# Patient Record
Sex: Female | Born: 1950
Health system: Southern US, Community
[De-identification: ages and names within clinical notes are randomized; demographics above are authoritative.]

## PROBLEM LIST (undated history)

## (undated) DIAGNOSIS — E669 Obesity, unspecified: Secondary | ICD-10-CM

## (undated) DIAGNOSIS — M129 Arthropathy, unspecified: Secondary | ICD-10-CM

## (undated) DIAGNOSIS — K5190 Ulcerative colitis, unspecified, without complications: Secondary | ICD-10-CM

## (undated) DIAGNOSIS — R7303 Prediabetes: Secondary | ICD-10-CM

## (undated) DIAGNOSIS — E785 Hyperlipidemia, unspecified: Secondary | ICD-10-CM

## (undated) DIAGNOSIS — G5601 Carpal tunnel syndrome, right upper limb: Secondary | ICD-10-CM

## (undated) DIAGNOSIS — M1710 Unilateral primary osteoarthritis, unspecified knee: Secondary | ICD-10-CM

## (undated) DIAGNOSIS — R079 Chest pain, unspecified: Secondary | ICD-10-CM

## (undated) DIAGNOSIS — Z8249 Family history of ischemic heart disease and other diseases of the circulatory system: Secondary | ICD-10-CM

## (undated) DIAGNOSIS — K219 Gastro-esophageal reflux disease without esophagitis: Secondary | ICD-10-CM

## (undated) DIAGNOSIS — J309 Allergic rhinitis, unspecified: Secondary | ICD-10-CM

## (undated) HISTORY — PX: FOOT SURGERY: SHX648

## (undated) HISTORY — DX: Hyperlipidemia, unspecified: E78.5

## (undated) HISTORY — DX: Obesity, unspecified: E66.9

## (undated) HISTORY — DX: Chest pain, unspecified: R07.9

## (undated) HISTORY — PX: OTHER SURGICAL HISTORY: SHX169

## (undated) HISTORY — DX: Carpal tunnel syndrome, right upper limb: G56.01

## (undated) HISTORY — DX: Arthropathy, unspecified: M12.9

## (undated) HISTORY — PX: HAMMER TOE SURGERY: SHX385

## (undated) HISTORY — DX: Gastro-esophageal reflux disease without esophagitis: K21.9

## (undated) HISTORY — DX: Allergic rhinitis, unspecified: J30.9

## (undated) HISTORY — DX: Ulcerative colitis, unspecified, without complications: K51.90

## (undated) HISTORY — DX: Unilateral primary osteoarthritis, unspecified knee: M17.10

## (undated) HISTORY — DX: Prediabetes: R73.03

## (undated) HISTORY — DX: Family history of ischemic heart disease and other diseases of the circulatory system: Z82.49

---

## 1986-03-14 HISTORY — PX: TUBAL LIGATION: SHX77

## 1986-03-14 HISTORY — PX: BREAST SURGERY: SHX581

## 1999-03-15 HISTORY — PX: ABDOMINAL HYSTERECTOMY: SHX81

## 2003-03-15 HISTORY — PX: LAPAROSCOPIC GASTRIC BANDING: SHX1100

## 2004-03-14 LAB — HM COLONOSCOPY: HM Colonoscopy: NORMAL

## 2008-03-14 LAB — CONVERTED CEMR LAB: Pap Smear: NEGATIVE

## 2008-08-01 ENCOUNTER — Encounter: Payer: Self-pay | Admitting: Internal Medicine

## 2008-08-01 ENCOUNTER — Encounter: Payer: Self-pay | Admitting: Cardiology

## 2008-11-20 ENCOUNTER — Ambulatory Visit: Payer: Self-pay | Admitting: Internal Medicine

## 2008-11-20 DIAGNOSIS — K5190 Ulcerative colitis, unspecified, without complications: Secondary | ICD-10-CM | POA: Insufficient documentation

## 2008-11-20 DIAGNOSIS — F4323 Adjustment disorder with mixed anxiety and depressed mood: Secondary | ICD-10-CM

## 2008-11-20 DIAGNOSIS — F419 Anxiety disorder, unspecified: Secondary | ICD-10-CM | POA: Insufficient documentation

## 2008-11-20 DIAGNOSIS — M1710 Unilateral primary osteoarthritis, unspecified knee: Secondary | ICD-10-CM

## 2008-11-20 DIAGNOSIS — J309 Allergic rhinitis, unspecified: Secondary | ICD-10-CM | POA: Insufficient documentation

## 2008-11-20 HISTORY — DX: Ulcerative colitis, unspecified, without complications: K51.90

## 2008-11-20 HISTORY — DX: Unilateral primary osteoarthritis, unspecified knee: M17.10

## 2008-12-08 ENCOUNTER — Ambulatory Visit: Payer: Self-pay | Admitting: Cardiology

## 2008-12-09 ENCOUNTER — Ambulatory Visit: Payer: Self-pay | Admitting: Cardiology

## 2008-12-09 ENCOUNTER — Ambulatory Visit (HOSPITAL_COMMUNITY): Admission: RE | Admit: 2008-12-09 | Discharge: 2008-12-09 | Payer: Self-pay | Admitting: Cardiology

## 2008-12-10 ENCOUNTER — Telehealth (INDEPENDENT_AMBULATORY_CARE_PROVIDER_SITE_OTHER): Payer: Self-pay | Admitting: *Deleted

## 2008-12-18 ENCOUNTER — Telehealth: Payer: Self-pay | Admitting: Cardiology

## 2008-12-30 ENCOUNTER — Telehealth: Payer: Self-pay | Admitting: Internal Medicine

## 2009-02-10 ENCOUNTER — Ambulatory Visit: Payer: Self-pay | Admitting: Cardiology

## 2009-02-16 LAB — CONVERTED CEMR LAB
ALT: 33 units/L (ref 0–35)
AST: 22 units/L (ref 0–37)
Albumin: 3.7 g/dL (ref 3.5–5.2)
Alkaline Phosphatase: 79 units/L (ref 39–117)
Cholesterol: 143 mg/dL (ref 0–200)
HDL: 62.3 mg/dL (ref >39.00)
Total Protein: 7 g/dL (ref 6.0–8.3)
Triglycerides: 61 mg/dL (ref 0.0–149.0)

## 2009-07-13 ENCOUNTER — Encounter: Payer: Self-pay | Admitting: Internal Medicine

## 2009-09-21 ENCOUNTER — Encounter: Payer: Self-pay | Admitting: Internal Medicine

## 2009-11-13 ENCOUNTER — Ambulatory Visit (HOSPITAL_COMMUNITY): Admission: RE | Admit: 2009-11-13 | Discharge: 2009-11-13 | Payer: Self-pay | Admitting: Obstetrics and Gynecology

## 2010-04-05 ENCOUNTER — Encounter: Payer: Self-pay | Admitting: Cardiology

## 2010-04-13 NOTE — Letter (Signed)
[facility]   Imported By: Phillis Knack 09/23/2009 14:40:01  _____________________________________________________________________  External Attachment:    Type:   Image     Comment:   External Document

## 2010-04-13 NOTE — Letter (Signed)
Summary: [facility]   Imported By: Bubba Hales 07/15/2009 08:34:04  _____________________________________________________________________  External Attachment:    Type:   Image     Comment:   External Document

## 2010-08-18 ENCOUNTER — Other Ambulatory Visit: Payer: Self-pay | Admitting: Cardiology

## 2010-11-22 ENCOUNTER — Telehealth: Payer: Self-pay | Admitting: *Deleted

## 2010-11-22 DIAGNOSIS — Z0000 Encounter for general adult medical examination without abnormal findings: Secondary | ICD-10-CM

## 2010-11-22 MED ORDER — AZITHROMYCIN 250 MG PO TABS: ORAL_TABLET | ORAL | Status: AC

## 2010-11-22 NOTE — Telephone Encounter (Signed)
Pt left vm made cpx appt for 12/06/10, but she has a sinus infection wanting md to send in z-pack to [facility].Marland KitchenMarland Kitchen9/10/12@12 :06pm/LMB

## 2010-11-22 NOTE — Telephone Encounter (Signed)
zpak done but will need OV if unimproved or worse before CPX visit later this month - noted labs entered - thx!

## 2010-11-22 NOTE — Telephone Encounter (Signed)
Received staff msg pt is coming in for CPX 12/06/10. Need labs entered. Entered labs in EPIC.Marland KitchenMarland Kitchen9/10/12@12 :16pm/LMB

## 2010-11-22 NOTE — Telephone Encounter (Signed)
Notified pt with md response.Marland KitchenMarland Kitchen9/10/12@1 :20pm/LMB

## 2010-11-24 ENCOUNTER — Encounter: Payer: Self-pay | Admitting: Internal Medicine

## 2010-12-03 ENCOUNTER — Other Ambulatory Visit (INDEPENDENT_AMBULATORY_CARE_PROVIDER_SITE_OTHER): Payer: Self-pay

## 2010-12-03 DIAGNOSIS — Z0000 Encounter for general adult medical examination without abnormal findings: Secondary | ICD-10-CM

## 2010-12-03 LAB — CBC WITH DIFFERENTIAL/PLATELET
Basophils Absolute: 0 10*3/uL (ref 0.0–0.1)
Basophils Relative: 0.6 % (ref 0.0–3.0)
Eosinophils Absolute: 0.3 10*3/uL (ref 0.0–0.7)
Lymphocytes Relative: 29.9 % (ref 12.0–46.0)
MCHC: 33.5 g/dL (ref 30.0–36.0)
Monocytes Relative: 7.7 % (ref 3.0–12.0)
Neutrophils Relative %: 57.9 % (ref 43.0–77.0)
RBC: 4.64 Mil/uL (ref 3.87–5.11)

## 2010-12-03 LAB — BASIC METABOLIC PANEL
BUN: 22 mg/dL (ref 6–23)
Chloride: 105 mEq/L (ref 96–112)
Potassium: 4.2 mEq/L (ref 3.5–5.1)

## 2010-12-03 LAB — URINALYSIS, ROUTINE W REFLEX MICROSCOPIC
Bilirubin Urine: NEGATIVE
Leukocytes, UA: NEGATIVE
Nitrite: NEGATIVE
Specific Gravity, Urine: >=1.03 (ref 1.000–1.030)
pH: 5.5 (ref 5.0–8.0)

## 2010-12-03 LAB — LIPID PANEL: Total CHOL/HDL Ratio: 3

## 2010-12-03 LAB — HEPATIC FUNCTION PANEL
Albumin: 3.9 g/dL (ref 3.5–5.2)
Alkaline Phosphatase: 85 U/L (ref 39–117)
Total Protein: 7.1 g/dL (ref 6.0–8.3)

## 2010-12-03 LAB — TSH: TSH: 1.85 u[IU]/mL (ref 0.35–5.50)

## 2010-12-06 ENCOUNTER — Ambulatory Visit (INDEPENDENT_AMBULATORY_CARE_PROVIDER_SITE_OTHER): Payer: 59 | Admitting: Internal Medicine

## 2010-12-06 ENCOUNTER — Encounter: Payer: Self-pay | Admitting: Internal Medicine

## 2010-12-06 VITALS — BP 120/82 | HR 70 | Temp 97.7°F | Ht 65.0 in | Wt 188.8 lb

## 2010-12-06 DIAGNOSIS — Z23 Encounter for immunization: Secondary | ICD-10-CM

## 2010-12-06 DIAGNOSIS — Z0000 Encounter for general adult medical examination without abnormal findings: Secondary | ICD-10-CM

## 2010-12-06 DIAGNOSIS — K5190 Ulcerative colitis, unspecified, without complications: Secondary | ICD-10-CM

## 2010-12-06 DIAGNOSIS — E785 Hyperlipidemia, unspecified: Secondary | ICD-10-CM | POA: Insufficient documentation

## 2010-12-06 DIAGNOSIS — M129 Arthropathy, unspecified: Secondary | ICD-10-CM

## 2010-12-06 MED ORDER — MELOXICAM 7.5 MG PO TABS: 7.5000 mg | ORAL_TABLET | Freq: Every day | ORAL | Status: DC

## 2010-12-06 NOTE — Assessment & Plan Note (Signed)
Strong FH CAD On atorva - lipids at goal The current medical regimen is effective;  continue present plan and medications.

## 2010-12-06 NOTE — Assessment & Plan Note (Signed)
Dx at routine colo 2006 in [place] - no records on file Never on meds and asymptomatic but refer to GI for follow up colo screen

## 2010-12-06 NOTE — Patient Instructions (Signed)
It was good to see you today. Tdap booster given today we'll make referral to GI given history of Ulcerative Colitis. Our office will contact you regarding appointment(s) once made. We have reviewed your prior records including labs and tests today - work on weight and increase exercise efforts but no other changes recommended Refill on medication(s) as discussed today. Please schedule followup in 1 year for physical and labs, call sooner if problems.

## 2010-12-06 NOTE — Progress Notes (Signed)
Subjective:    Patient ID: Victoria Stone, female    DOB: 03-Oct-1950, 60 y.o.   MRN: [id number]  HPI patient is here today for annual physical. Patient feels well and has no complaints.  Also reviewed chronic medical issues:  strong FH CAD - numerous premature deaths no DM or HTN  no anginal symptoms, typical or otherwise - no CP, DOE, PND, N/V  last stress test 12/2008: normal   ulcerative colitis - never has symptoms such as diarrhea, or bleeding  Diagnosed incidentally at routine colonoscopy, age greater than 50  Never been on treatment for colitis  Has not followed routinely with GI recommended only Colo q 5y (next 2011)   hx depression -  takes low dose Prozac for >10y due to hx same  no current symptoms of sadness, tearfulness, or sleeping problems  No interest or need in cutting back on medications as she feels good on current therapy   Past Medical History  Diagnosis Date  . ALLERGIC RHINITIS   . ARTHRITIS   . DEPRESSION   . ULCERATIVE COLITIS   . Dyslipidemia    Family History  Problem Relation Age of Onset  . Hypertension Mother   . Lung cancer Mother   . Hypertension Father   . Hypertension Sister   . Diabetes Sister   . Coronary artery disease Brother   . Hypertension Maternal Grandmother   . Hypertension Paternal Grandmother   . Hypertension Paternal Grandfather    History  Substance Use Topics  . Smoking status: Never Smoker   . 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 Alcohol Use: No     Review of Systems Constitutional: Negative for fever.  Respiratory: Negative for cough and shortness of breath.   Cardiovascular: Negative for chest pain.  Gastrointestinal: Negative for abdominal pain.  Musculoskeletal: Negative for gait problem.  Skin: Negative for rash.  Neurological: Negative for dizziness.  No other specific complaints in a complete review of systems (except as listed in HPI above).       Objective:   Physical Exam BP 120/82  Pulse 70  Temp(Src) 97.7 F (36.5 C) (Oral)  Ht 5' 5"  (1.651 m)  Wt 188 lb 12.8 oz (85.639 kg)  BMI 31.42 kg/m2  SpO2 96% Wt Readings from Last 3 Encounters:  12/06/10 188 lb 12.8 oz (85.639 kg)  12/08/08 166 lb (75.297 kg)  11/20/08 160 lb 12.8 oz (72.938 kg)   Constitutional: She is well-developed and well-nourished. No distress.  HENT: Head: Normocephalic and atraumatic. Ears: B TMs ok, no erythema or effusion; Nose: Nose normal.  Mouth/Throat: Oropharynx is clear and moist. No oropharyngeal exudate.  Eyes: Conjunctivae and EOM are normal. Pupils are equal, round, and reactive to light. No scleral icterus.  Neck: Normal range of motion. Neck supple. No JVD present. No thyromegaly present.  Cardiovascular: Normal rate, regular rhythm and normal heart sounds.  No murmur heard. No BLE edema. Pulmonary/Chest: Effort normal and breath sounds normal. No respiratory distress. She has no wheezes.  Abdominal: Soft. Bowel sounds are normal. She exhibits no distension. There is no tenderness. no masses Musculoskeletal: Normal range of motion, no joint effusions. No gross deformities Neurological: She is alert and oriented to person, place, and time. No cranial nerve deficit. Coordination normal.  Skin: Skin is warm and dry. No rash noted. No erythema.  Psychiatric: She has a normal mood and affect. Her behavior is normal. Judgment and thought content normal.  Lab Results  Component Value Date   WBC 7.6 12/03/2010   HGB 14.2 12/03/2010   HCT 42.4 12/03/2010   PLT 285.0 12/03/2010   CHOL 149 12/03/2010   TRIG 118.0 12/03/2010   HDL 59.10 12/03/2010   ALT 33 12/03/2010   AST 24 12/03/2010   NA 142 12/03/2010   K 4.2 12/03/2010   CL 105 12/03/2010   CREATININE 0.8 12/03/2010   BUN 22 12/03/2010   CO2 30 12/03/2010   TSH 1.85 12/03/2010   EKG: NSR at 63bpm, no ST-T changes or arrythmias      Assessment & Plan:  CPX - v70.0 - Patient has been counseled  on age-appropriate routine health concerns for screening and prevention. These are reviewed and up-to-date. Immunizations are up-to-date or declined. Labs and ECG reviewed. recommended wt loss -   send for 48yrGI f/u given hx [place]  Also See problem list. Medications and labs reviewed today.

## 2010-12-06 NOTE — Assessment & Plan Note (Signed)
On meloxicam since 10/2010 - controls general OA symptoms  Refill provided - The current medical regimen is effective;  continue present plan and medications.

## 2010-12-17 ENCOUNTER — Other Ambulatory Visit (HOSPITAL_COMMUNITY): Payer: Self-pay | Admitting: Obstetrics and Gynecology

## 2010-12-17 DIAGNOSIS — Z1231 Encounter for screening mammogram for malignant neoplasm of breast: Secondary | ICD-10-CM

## 2010-12-23 ENCOUNTER — Encounter: Payer: Self-pay | Admitting: *Deleted

## 2010-12-24 ENCOUNTER — Ambulatory Visit: Payer: 59 | Admitting: Gastroenterology

## 2010-12-31 ENCOUNTER — Ambulatory Visit: Payer: 59 | Admitting: Gastroenterology

## 2011-01-06 ENCOUNTER — Ambulatory Visit (HOSPITAL_COMMUNITY)
Admission: RE | Admit: 2011-01-06 | Discharge: 2011-01-06 | Disposition: A | Discharge status: Home / Self Care | Payer: 59 | Source: Ambulatory Visit | Attending: Obstetrics and Gynecology | Admitting: Obstetrics and Gynecology

## 2011-01-06 ENCOUNTER — Ambulatory Visit (HOSPITAL_COMMUNITY): Payer: 59

## 2011-01-06 DIAGNOSIS — Z1231 Encounter for screening mammogram for malignant neoplasm of breast: Secondary | ICD-10-CM | POA: Insufficient documentation

## 2011-01-17 ENCOUNTER — Encounter: Payer: Self-pay | Admitting: Gastroenterology

## 2011-01-18 ENCOUNTER — Encounter: Payer: Self-pay | Admitting: Gastroenterology

## 2011-01-18 ENCOUNTER — Ambulatory Visit (INDEPENDENT_AMBULATORY_CARE_PROVIDER_SITE_OTHER): Payer: 59 | Admitting: Gastroenterology

## 2011-01-18 DIAGNOSIS — K5190 Ulcerative colitis, unspecified, without complications: Secondary | ICD-10-CM

## 2011-01-18 MED ORDER — PEG-KCL-NACL-NASULF-NA ASC-C 100 G PO SOLR: 1.0000 | Freq: Once | ORAL | Status: DC

## 2011-01-18 NOTE — Patient Instructions (Signed)
You have been scheduled for a colonoscopy on 02/14/2011 @ 8:30am. Please follow written instructions given to you at your visit today.  Please pick up your prep kit at the pharmacy within the next 2-3 days.  CC: Gwendolyn Grant M.D   Colonoscopy A colonoscopy is an exam to evaluate your entire colon. In this exam, your colon is cleansed. A long fiberoptic tube is inserted through your rectum and into your colon. The fiberoptic scope (endoscope) is a long bundle of enclosed and very flexible fibers. These fibers transmit light to the area examined and send images from that area to your caregiver. Discomfort is usually minimal. You may be given a drug to help you sleep (sedative) during or prior to the procedure. This exam helps to detect lumps (tumors), polyps, inflammation, and areas of bleeding. Your caregiver may also take a small piece of tissue (biopsy) that will be examined under a microscope. LET YOUR CAREGIVER KNOW ABOUT:   Allergies to food or medicine.   Medicines taken, including vitamins, herbs, eyedrops, over-the-counter medicines, and creams.   Use of steroids (by mouth or creams).   Previous problems with anesthetics or numbing medicines.   History of bleeding problems or blood clots.   Previous surgery.   Other health problems, including diabetes and kidney problems.   Possibility of pregnancy, if this applies.  BEFORE THE PROCEDURE   A clear liquid diet may be required for 2 days before the exam.   Ask your caregiver about changing or stopping your regular medications.   Liquid injections (enemas) or laxatives may be required.   A large amount of electrolyte solution may be given to you to drink over a short period of time. This solution is used to clean out your colon.   You should be present 60 minutes prior to your procedure or as directed by your caregiver.  AFTER THE PROCEDURE   If you received a sedative or pain relieving medication, you will need to arrange  for someone to drive you home.   Occasionally, there is a little blood passed with the first bowel movement. Do not be concerned.  FINDING OUT THE RESULTS OF YOUR TEST Not all test results are available during your visit. If your test results are not back during the visit, make an appointment with your caregiver to find out the results. Do not assume everything is normal if you have not heard from your caregiver or the medical facility. It is important for you to follow up on all of your test results. HOME CARE INSTRUCTIONS   It is not unusual to pass moderate amounts of gas and experience mild abdominal cramping following the procedure. This is due to air being used to inflate your colon during the exam. Walking or a warm pack on your belly (abdomen) may help.   You may resume all normal meals and activities after sedatives and medicines have worn off.   Only take over-the-counter or prescription medicines for pain, discomfort, or fever as directed by your caregiver. Do not use aspirin or blood thinners if a biopsy was taken. Consult your caregiver for medicine usage if biopsies were taken.  SEEK IMMEDIATE MEDICAL CARE IF:   You have a fever.   You pass large blood clots or fill a toilet with blood following the procedure. This may also occur 10 to 14 days following the procedure. This is more likely if a biopsy was taken.   You develop abdominal pain that keeps getting worse and cannot  be relieved with medicine.  Document Released: 02/26/2000 Document Revised: 11/10/2010 Document Reviewed: 10/11/2007 [facility] Patient Information 2012 [place].

## 2011-01-18 NOTE — Progress Notes (Signed)
History of Present Illness:  This is a fair pleasant 60 year old Caucasian female referred for screening colonoscopy. She is completely asymptomatic in terms of any general medical or gastrointestinal symptoms. She apparently had colonoscopy elsewhere 7 years ago except for some possible mild proctitis. This report is not available for review. Currently she has regular bowel movements without melena, hematochezia, or abdominal pain. She denies any upper gastrointestinal or hepatobiliary symptomatology. Family history is noncontributory.  I have reviewed this patient's present history, medical and surgical past history, allergies and medications.     ROS: The remainder of the 10 point ROS is negative     Physical Exam: General well developed well nourished patient in no acute distress, appearing her stated age Eyes PERRLA, no icterus, fundoscopic exam per opthamologist Skin no lesions noted Neck supple, no adenopathy, no thyroid enlargement, no tenderness Heart no significant murmurs, gallops or rubs noted Abdomen no hepatosplenomegaly masses or tenderness, BS normal.  Rectal inspection normal no fissures, or fistulae noted.  No masses or tenderness on digital exam. Stool guaiac negative. Extremities no acute joint lesions, edema, phlebitis or evidence of cellulitis. Psychological mental status normal and normal affect.  Assessment and plan: Healthy middle-aged patient with possible chronic ulcerative colitis. She currently is asymptomatic. I scheduled her for followup colonoscopy her convenience. She is to continue other medications as listed per primary care.  No diagnosis found.

## 2011-02-14 ENCOUNTER — Ambulatory Visit (AMBULATORY_SURGERY_CENTER): Payer: 59 | Admitting: Gastroenterology

## 2011-02-14 ENCOUNTER — Encounter: Payer: Self-pay | Admitting: Gastroenterology

## 2011-02-14 VITALS — BP 123/77 | HR 59 | Temp 97.8°F | Resp 12 | Ht 65.0 in | Wt 192.0 lb

## 2011-02-14 DIAGNOSIS — Z1211 Encounter for screening for malignant neoplasm of colon: Secondary | ICD-10-CM

## 2011-02-14 DIAGNOSIS — K5190 Ulcerative colitis, unspecified, without complications: Secondary | ICD-10-CM

## 2011-02-14 HISTORY — PX: COLONOSCOPY: SHX174

## 2011-02-14 MED ORDER — SODIUM CHLORIDE 0.9 % IV SOLN: 500.0000 mL | INTRAVENOUS | Status: DC

## 2011-02-14 NOTE — Progress Notes (Signed)
Patient did not experience any of the following events: a burn prior to discharge; a fall within the facility; wrong site/side/patient/procedure/implant event; or a hospital transfer or hospital admission upon discharge from the facility. (G8907) Patient did not have preoperative order for IV antibiotic SSI prophylaxis. (G8918)  

## 2011-02-14 NOTE — Progress Notes (Signed)
Patient's carepartner not present on admission,. Works at [employer] and gets off today at [place], will come after work. Patient calling husband.

## 2011-02-14 NOTE — Patient Instructions (Signed)
NORMAL COLON  SEE GREEN AND BLUE SHEETS FOR ADDITIONAL D/C INSTRUCTIONS

## 2011-02-15 ENCOUNTER — Telehealth: Payer: Self-pay | Admitting: *Deleted

## 2011-02-15 NOTE — Telephone Encounter (Signed)
No answer, message left

## 2011-08-30 ENCOUNTER — Other Ambulatory Visit: Payer: Self-pay | Admitting: Cardiology

## 2011-08-30 ENCOUNTER — Other Ambulatory Visit: Payer: Self-pay | Admitting: Internal Medicine

## 2011-12-16 ENCOUNTER — Other Ambulatory Visit (HOSPITAL_COMMUNITY): Payer: Self-pay | Admitting: Obstetrics and Gynecology

## 2011-12-16 DIAGNOSIS — Z1231 Encounter for screening mammogram for malignant neoplasm of breast: Secondary | ICD-10-CM

## 2011-12-19 ENCOUNTER — Encounter: Payer: Self-pay | Admitting: Internal Medicine

## 2012-01-09 ENCOUNTER — Telehealth: Payer: Self-pay | Admitting: *Deleted

## 2012-01-09 DIAGNOSIS — Z0000 Encounter for general adult medical examination without abnormal findings: Secondary | ICD-10-CM

## 2012-01-09 NOTE — Telephone Encounter (Signed)
Received staff msg pt made cpx for 02/20/12. Putting in cpx labs in comp...Johny Chess

## 2012-01-09 NOTE — Telephone Encounter (Signed)
Message copied by Earnstine Regal on Mon Jan 09, 2012  2:35 PM ------      Message from: Sherral Hammers      Created: Mon Jan 09, 2012  2:09 PM      Regarding: LAB       PHYSICAL LABS FOR DEC 9  APPT

## 2012-01-10 ENCOUNTER — Ambulatory Visit (HOSPITAL_COMMUNITY)
Admission: RE | Admit: 2012-01-10 | Discharge: 2012-01-10 | Disposition: A | Discharge status: Home / Self Care | Payer: 59 | Source: Ambulatory Visit | Attending: Obstetrics and Gynecology | Admitting: Obstetrics and Gynecology

## 2012-01-10 DIAGNOSIS — Z1231 Encounter for screening mammogram for malignant neoplasm of breast: Secondary | ICD-10-CM | POA: Insufficient documentation

## 2012-02-13 ENCOUNTER — Other Ambulatory Visit (INDEPENDENT_AMBULATORY_CARE_PROVIDER_SITE_OTHER): Payer: 59

## 2012-02-13 DIAGNOSIS — Z0000 Encounter for general adult medical examination without abnormal findings: Secondary | ICD-10-CM

## 2012-02-13 LAB — URINALYSIS, ROUTINE W REFLEX MICROSCOPIC
Hgb urine dipstick: NEGATIVE
Leukocytes, UA: NEGATIVE
Nitrite: NEGATIVE
Specific Gravity, Urine: >=1.03 (ref 1.000–1.030)
Urine Glucose: NEGATIVE
Urobilinogen, UA: 0.2 (ref 0.0–1.0)

## 2012-02-13 LAB — TSH: TSH: 3.14 u[IU]/mL (ref 0.35–5.50)

## 2012-02-13 LAB — LIPID PANEL
Cholesterol: 156 mg/dL (ref 0–200)
LDL Cholesterol: 66 mg/dL (ref 0–99)
Total CHOL/HDL Ratio: 2
Triglycerides: 86 mg/dL (ref 0.0–149.0)
VLDL: 17.2 mg/dL (ref 0.0–40.0)

## 2012-02-13 LAB — HEPATIC FUNCTION PANEL
Albumin: 3.8 g/dL (ref 3.5–5.2)
Alkaline Phosphatase: 65 U/L (ref 39–117)
Total Protein: 6.6 g/dL (ref 6.0–8.3)

## 2012-02-20 ENCOUNTER — Ambulatory Visit (INDEPENDENT_AMBULATORY_CARE_PROVIDER_SITE_OTHER): Payer: 59 | Admitting: Internal Medicine

## 2012-02-20 ENCOUNTER — Encounter: Payer: Self-pay | Admitting: Internal Medicine

## 2012-02-20 VITALS — BP 120/82 | HR 65 | Temp 97.0°F | Ht 65.0 in | Wt 161.1 lb

## 2012-02-20 DIAGNOSIS — R945 Abnormal results of liver function studies: Secondary | ICD-10-CM

## 2012-02-20 DIAGNOSIS — Z0000 Encounter for general adult medical examination without abnormal findings: Secondary | ICD-10-CM

## 2012-02-20 DIAGNOSIS — E785 Hyperlipidemia, unspecified: Secondary | ICD-10-CM

## 2012-02-20 DIAGNOSIS — L301 Dyshidrosis [pompholyx]: Secondary | ICD-10-CM

## 2012-02-20 DIAGNOSIS — K5190 Ulcerative colitis, unspecified, without complications: Secondary | ICD-10-CM

## 2012-02-20 DIAGNOSIS — H8309 Labyrinthitis, unspecified ear: Secondary | ICD-10-CM

## 2012-02-20 DIAGNOSIS — R7989 Other specified abnormal findings of blood chemistry: Secondary | ICD-10-CM

## 2012-02-20 DIAGNOSIS — E669 Obesity, unspecified: Secondary | ICD-10-CM | POA: Insufficient documentation

## 2012-02-20 MED ORDER — TRIAMCINOLONE ACETONIDE 0.5 % EX OINT: TOPICAL_OINTMENT | Freq: Two times a day (BID) | CUTANEOUS | Status: DC

## 2012-02-20 NOTE — Progress Notes (Signed)
Subjective:    Patient ID: Victoria Stone, female    DOB: June 14, 1950, 61 y.o.   MRN: [id number]  HPI  patient is here today for annual physical. Patient feels well and has no complaints.  Also reviewed chronic medical issues:  Dyslipidemia - on statin, also preventative treatment given strong FH of CAD -the patient reports compliance with medication(s) as prescribed. Denies adverse side effects.  ulcerative colitis, asymptomatic  Diagnosed incidentally at routine colonoscopy (age 19)  Never been on med treatment for colitis  follows with GI for Colo q 5y   depression - takes chronic low dose Prozac due to hx same  no current symptoms of sadness, tearfulness, or sleeping problems  No interest or need in cutting back on medications as she feels good on current therapy   Past Medical History  Diagnosis Date  . ALLERGIC RHINITIS   . ARTHRITIS   . ULCERATIVE COLITIS   . Dyslipidemia    Family History  Problem Relation Age of Onset  . Hypertension Mother   . Hypertension Father   . Hypertension Sister   . Diabetes Sister   . Heart disease Brother   . Hypertension Maternal Grandmother   . Hypertension Paternal Grandmother   . Hypertension Paternal Grandfather   . Heart disease Father   . Heart attack Sister   . Breast cancer Sister    History  Substance Use Topics  . Smoking status: Never Smoker   . 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 Alcohol Use: No     Comment: occasionally    Review of Systems  Constitutional: Negative for fever.  Respiratory: Negative for cough and shortness of breath.   Cardiovascular: Negative for chest pain or palpitations.  Gastrointestinal: Negative for abdominal pain, nausea and vomiting or bowel changes.  Musculoskeletal: Negative for gait problem.  Skin: Negative for rash.  Neurological: Negative for headache -episodes of sudden intermittent dizziness x 3 weeks, none in past 3 weeks.   No other specific complaints in a complete review of systems (except as listed in HPI above).     Objective:   Physical Exam  BP 120/82  Pulse 65  Temp 97 F (36.1 C) (Oral)  Ht 5' 5"  (1.651 m)  Wt 161 lb 1.9 oz (73.084 kg)  BMI 26.81 kg/m2  SpO2 98% Wt Readings from Last 3 Encounters:  02/20/12 161 lb 1.9 oz (73.084 kg)  02/14/11 192 lb (87.091 kg)  01/18/11 192 lb (87.091 kg)   Constitutional: She is well-developed and well-nourished. No distress.  HENT: Head: Normocephalic and atraumatic. Ears: B TMs ok, no erythema or effusion; Nose: Nose normal. Mouth/Throat: Oropharynx is clear and moist. No oropharyngeal exudate.  Eyes: Conjunctivae and EOM are normal. Pupils are equal, round, and reactive to light. No scleral icterus.  Neck: Normal range of motion. Neck supple. No JVD present. No thyromegaly present.  Cardiovascular: Normal rate, regular rhythm and normal heart sounds.  No murmur heard. No BLE edema. Pulmonary/Chest: Effort normal and breath sounds normal. No respiratory distress. She has no wheezes.  Abdominal: Soft. Bowel sounds are normal. She exhibits no distension. There is no tenderness. no masses Musculoskeletal: Normal range of motion, no joint effusions. No gross deformities Neurological: She is alert and oriented to person, place, and time. No cranial nerve deficit. Coordination normal.  Skin: patchy eczema B feet - remaining skin is warm and dry. No other rash noted. No erythema.  Psychiatric:  She has a normal mood and affect. Her behavior is normal. Judgment and thought content normal.   Lab Results  Component Value Date   WBC 7.6 12/03/2010   HGB 14.2 12/03/2010   HCT 42.4 12/03/2010   PLT 285.0 12/03/2010   CHOL 156 02/13/2012   TRIG 86.0 02/13/2012   HDL 72.40 02/13/2012   ALT 42* 02/13/2012   AST 31 02/13/2012   NA 142 12/03/2010   K 4.2 12/03/2010   CL 105 12/03/2010   CREATININE 0.8 12/03/2010   BUN 22 12/03/2010   CO2 30 12/03/2010   TSH 3.14 02/13/2012    EKG: NSR at 62bpm, no ST-T changes or arrythmias     Assessment & Plan:  CPX - v70.0 - Patient has been counseled on age-appropriate routine health concerns for screening and prevention. These are reviewed and up-to-date. Immunizations are up-to-date or declined. Labs and ECG reviewed.  Seasonal eczema, B feet - topical steroid ointment prn - erx done  Episode of labyrinthitis with dizziness and vertigo October 2013, intermittent symptoms lasted 3 weeks in setting of allergic/sinus and URI symptoms - all have now resolved completely and neuro exam benign- discussed DDx including TIA and offered MRI/MRA brain, but as symptoms have resolved, pt declines need for same at this time - she agrees call if recurrent symptoms for additional eval as needed  Abn LFTs - very mild and isolated increase ALT of unlikely clinical significance - no GI symptoms - recheck in 6 weeks, pt to call sooner if problems, no med changes recommended

## 2012-02-20 NOTE — Assessment & Plan Note (Signed)
Dx at routine colo 2006 in [place] - last colo 02/2011 WNL Never on meds and asymptomatic

## 2012-02-20 NOTE — Assessment & Plan Note (Signed)
Wt Readings from Last 3 Encounters:  02/20/12 161 lb 1.9 oz (73.084 kg)  02/14/11 192 lb (87.091 kg)  01/18/11 192 lb (87.091 kg)   Weight changes reviewed intentional weight loss during 2013 with weight watchers (30#s down since 02/2011) The patient is asked to make continued efforts at improved diet and exercise patterns to aid in medical management of this problem.

## 2012-02-20 NOTE — Patient Instructions (Signed)
It was good to see you today. Health Maintenance reviewed - all recommended immunizations and age-appropriate screenings are up-to-date. We have reviewed your prior records including labs and tests today Medications reviewed, no changes at this time. If recurrent dizzy symptoms, call and we will schedule MRI brain as discussed, but i suspect your symptoms are related to inner ear problems Use steroid ointment to feet itch as needed Please schedule followup in 1 year, call sooner if problems. Health Maintenance, Females A healthy lifestyle and preventative care can promote health and wellness.  Maintain regular health, dental, and eye exams.   Eat a healthy diet. Foods like vegetables, fruits, whole grains, low-fat dairy products, and lean protein foods contain the nutrients you need without too many calories. Decrease your intake of foods high in solid fats, added sugars, and salt. Get information about a proper diet from your caregiver, if necessary.   Regular physical exercise is one of the most important things you can do for your health. Most adults should get at least 150 minutes of moderate-intensity exercise (any activity that increases your heart rate and causes you to sweat) each week. In addition, most adults need muscle-strengthening exercises on 2 or more days a week.     Maintain a healthy weight. The body mass index (BMI) is a screening tool to identify possible weight problems. It provides an estimate of body fat based on height and weight. Your caregiver can help determine your BMI, and can help you achieve or maintain a healthy weight. For adults 20 years and older:   A BMI below 18.5 is considered underweight.   A BMI of 18.5 to 24.9 is normal.   A BMI of 25 to 29.9 is considered overweight.   A BMI of 30 and above is considered obese.   Maintain normal blood lipids and cholesterol by exercising and minimizing your intake of saturated fat. Eat a balanced diet with plenty  of fruits and vegetables. Blood tests for lipids and cholesterol should begin at age 74 and be repeated every 5 years. If your lipid or cholesterol levels are high, you are over 50, or you are a high risk for heart disease, you may need your cholesterol levels checked more frequently. Ongoing high lipid and cholesterol levels should be treated with medicines if diet and exercise are not effective.   If you smoke, find out from your caregiver how to quit. If you do not use tobacco, do not start.   If you are pregnant, do not drink alcohol. If you are breastfeeding, be very cautious about drinking alcohol. If you are not pregnant and choose to drink alcohol, do not exceed 1 drink per day. One drink is considered to be 12 ounces (355 mL) of beer, 5 ounces (148 mL) of wine, or 1.5 ounces (44 mL) of liquor.   Avoid use of street drugs. Do not share needles with anyone. Ask for help if you need support or instructions about stopping the use of drugs.   High blood pressure causes heart disease and increases the risk of stroke. Blood pressure should be checked at least every 1 to 2 years. Ongoing high blood pressure should be treated with medicines, if weight loss and exercise are not effective.   If you are 87 to 59 years old, ask your caregiver if you should take aspirin to prevent strokes.   Diabetes screening involves taking a blood sample to check your fasting blood sugar level. This should be done once every 3  years, after age 33, if you are within normal weight and without risk factors for diabetes. Testing should be considered at a younger age or be carried out more frequently if you are overweight and have at least 1 risk factor for diabetes.   Breast cancer screening is essential preventative care for women. You should practice "breast self-awareness." This means understanding the normal appearance and feel of your breasts and may include breast self-examination. Any changes detected, no matter how  small, should be reported to a caregiver. Women in their 81s and 30s should have a clinical breast exam (CBE) by a caregiver as part of a regular health exam every 1 to 3 years. After age 50, women should have a CBE every year. Starting at age 33, women should consider having a mammogram (breast X-ray) every year. Women who have a family history of breast cancer should talk to their caregiver about genetic screening. Women at a high risk of breast cancer should talk to their caregiver about having an MRI and a mammogram every year.   The Pap test is a screening test for cervical cancer. Women should have a Pap test starting at age 32. Between ages 71 and 45, Pap tests should be repeated every 2 years. Beginning at age 67, you should have a Pap test every 3 years as long as the past 3 Pap tests have been normal. If you had a hysterectomy for a problem that was not cancer or a condition that could lead to cancer, then you no longer need Pap tests. If you are between ages 13 and 45, and you have had normal Pap tests going back 10 years, you no longer need Pap tests. If you have had past treatment for cervical cancer or a condition that could lead to cancer, you need Pap tests and screening for cancer for at least 20 years after your treatment. If Pap tests have been discontinued, risk factors (such as a new sexual partner) need to be reassessed to determine if screening should be resumed. Some women have medical problems that increase the chance of getting cervical cancer. In these cases, your caregiver may recommend more frequent screening and Pap tests.   The human papillomavirus (HPV) test is an additional test that may be used for cervical cancer screening. The HPV test looks for the virus that can cause the cell changes on the cervix. The cells collected during the Pap test can be tested for HPV. The HPV test could be used to screen women aged 32 years and older, and should be used in women of any age who have  unclear Pap test results. After the age of 75, women should have HPV testing at the same frequency as a Pap test.   Colorectal cancer can be detected and often prevented. Most routine colorectal cancer screening begins at the age of 26 and continues through age 60. However, your caregiver may recommend screening at an earlier age if you have risk factors for colon cancer. On a yearly basis, your caregiver may provide home test kits to check for hidden blood in the stool. Use of a small camera at the end of a tube, to directly examine the colon (sigmoidoscopy or colonoscopy), can detect the earliest forms of colorectal cancer. Talk to your caregiver about this at age 15, when routine screening begins. Direct examination of the colon should be repeated every 5 to 10 years through age 37, unless early forms of pre-cancerous polyps or small growths are found.  Hepatitis C blood testing is recommended for all people born from [birth year] through 1963 and any individual with known risks for hepatitis C.   Practice safe sex. Use condoms and avoid high-risk sexual practices to reduce the spread of sexually transmitted infections (STIs). Sexually active women aged 10 and younger should be checked for Chlamydia, which is a common sexually transmitted infection. Older women with new or multiple partners should also be tested for Chlamydia. Testing for other STIs is recommended if you are sexually active and at increased risk.   Osteoporosis is a disease in which the bones lose minerals and strength with aging. This can result in serious bone fractures. The risk of osteoporosis can be identified using a bone density scan. Women ages 41 and over and women at risk for fractures or osteoporosis should discuss screening with their caregivers. Ask your caregiver whether you should be taking a calcium supplement or vitamin D to reduce the rate of osteoporosis.   Menopause can be associated with physical symptoms and risks.  Hormone replacement therapy is available to decrease symptoms and risks. You should talk to your caregiver about whether hormone replacement therapy is right for you.   Use sunscreen with a sun protection factor (SPF) of 30 or greater. Apply sunscreen liberally and repeatedly throughout the day. You should seek shade when your shadow is shorter than you. Protect yourself by wearing long sleeves, pants, a wide-brimmed hat, and sunglasses year round, whenever you are outdoors.   Notify your caregiver of new moles or changes in moles, especially if there is a change in shape or color. Also notify your caregiver if a mole is larger than the size of a pencil eraser.   Stay current with your immunizations.  Document Released: 09/13/2010 Document Revised: 05/23/2011 Document Reviewed: 09/13/2010 [facility] Patient Information 2013 [place].   Labyrinthitis (Inner Ear Inflammation) Your exam shows you have an inner ear disturbance or labyrinthitis. The cause of this condition is not known. But it may be due to a virus infection. The symptoms of labyrinthitis include vertigo or dizziness made worse by motion, nausea and vomiting. The onset of labyrinthitis may be very sudden. It usually lasts for a few days and then clears up over 1-2 weeks. The treatment of an inner ear disturbance includes bed rest and medications to reduce dizziness, nausea, and vomiting. You should stay away from alcohol, tranquilizers, caffeine, nicotine, or any medicine your doctor thinks may make your symptoms worse. Further testing may be needed to evaluate your hearing and balance system. Please see your doctor or go to the emergency room right away if you have:  Increasing vertigo, earache, loss of hearing, or ear drainage.   Headache, blurred vision, trouble walking, fainting, or fever.   Persistent vomiting, dehydration, or extreme weakness.  Document Released: 02/28/2005 Document Revised: 05/23/2011 Document Reviewed:  08/16/2006 [facility] Patient Information 2013 [place].

## 2012-02-20 NOTE — Assessment & Plan Note (Signed)
Strong FH CAD On atorva - lipids at goal The current medical regimen is effective;  continue present plan and medications.

## 2012-02-24 ENCOUNTER — Other Ambulatory Visit: Payer: Self-pay | Admitting: Cardiology

## 2012-02-24 ENCOUNTER — Other Ambulatory Visit: Payer: Self-pay | Admitting: Internal Medicine

## 2012-02-27 ENCOUNTER — Other Ambulatory Visit: Payer: Self-pay | Admitting: *Deleted

## 2012-02-27 MED ORDER — ATORVASTATIN CALCIUM 20 MG PO TABS: 20.0000 mg | ORAL_TABLET | Freq: Every day | ORAL | Status: DC

## 2012-06-20 ENCOUNTER — Other Ambulatory Visit: Payer: Self-pay | Admitting: Internal Medicine

## 2012-10-26 ENCOUNTER — Ambulatory Visit (INDEPENDENT_AMBULATORY_CARE_PROVIDER_SITE_OTHER): Payer: 59 | Admitting: *Deleted

## 2012-10-26 DIAGNOSIS — Z23 Encounter for immunization: Secondary | ICD-10-CM

## 2012-10-26 DIAGNOSIS — Z2911 Encounter for prophylactic immunotherapy for respiratory syncytial virus (RSV): Secondary | ICD-10-CM

## 2012-12-25 ENCOUNTER — Other Ambulatory Visit (HOSPITAL_COMMUNITY): Payer: Self-pay | Admitting: Obstetrics and Gynecology

## 2012-12-25 DIAGNOSIS — Z0000 Encounter for general adult medical examination without abnormal findings: Secondary | ICD-10-CM

## 2013-01-16 ENCOUNTER — Ambulatory Visit (HOSPITAL_COMMUNITY)
Admission: RE | Admit: 2013-01-16 | Discharge: 2013-01-16 | Disposition: A | Discharge status: Home / Self Care | Payer: 59 | Source: Ambulatory Visit | Attending: Obstetrics and Gynecology | Admitting: Obstetrics and Gynecology

## 2013-01-16 DIAGNOSIS — Z0000 Encounter for general adult medical examination without abnormal findings: Secondary | ICD-10-CM

## 2013-01-16 DIAGNOSIS — Z1231 Encounter for screening mammogram for malignant neoplasm of breast: Secondary | ICD-10-CM | POA: Insufficient documentation

## 2013-01-17 ENCOUNTER — Other Ambulatory Visit: Payer: Self-pay

## 2013-01-17 LAB — HM MAMMOGRAPHY

## 2013-01-28 ENCOUNTER — Ambulatory Visit: Payer: 59 | Admitting: Family Medicine

## 2013-03-11 ENCOUNTER — Other Ambulatory Visit: Payer: Self-pay | Admitting: Internal Medicine

## 2013-03-18 ENCOUNTER — Encounter: Payer: 59 | Admitting: Internal Medicine

## 2013-03-19 ENCOUNTER — Other Ambulatory Visit: Payer: Self-pay | Admitting: Internal Medicine

## 2013-04-08 ENCOUNTER — Encounter: Payer: Self-pay | Admitting: Family Medicine

## 2013-04-08 ENCOUNTER — Ambulatory Visit (INDEPENDENT_AMBULATORY_CARE_PROVIDER_SITE_OTHER): Payer: 59 | Admitting: Family Medicine

## 2013-04-08 VITALS — Ht 65.0 in | Wt 196.9 lb

## 2013-04-08 DIAGNOSIS — E785 Hyperlipidemia, unspecified: Secondary | ICD-10-CM

## 2013-04-08 DIAGNOSIS — E669 Obesity, unspecified: Secondary | ICD-10-CM

## 2013-04-08 NOTE — Patient Instructions (Addendum)
-   Obtain twice as many veg's as protein or carbohydrate foods for both lunch and dinner.  - Most studies suggest that people find the most satisfaction with at least 3 components per meal.  - Portion control:  Use smaller plates, bowls, etc.   - GOAL:  Keep a daily food record.   - GOAL:  Keep a journal about night snacks:  - Experiment with having your snack while not doing something else.    - Experiment with different snacks, i.e., write out a list of options, and shop with these in mind.    - What do you find satisfying about this snack?  Not satisfying?   - Don't give up any particular snack until you have tried several times.    - Food satisfaction is crucial to best managing (controlling) your food choices.    - TASTE PREFERENCES ARE LEARNED.  This means that it will get easier to choose foods you know are good for you if you are exposed to them enough.   - GOAL:  Walk (or other exercise) at least 30 minutes 5 X wk.    - Track your minutes of exercise, and tally weekly totals, which you will write on the kitchen calendar.

## 2013-04-08 NOTE — Progress Notes (Signed)
Medical Nutrition Therapy:  Appt start time: 1000 end time:  1100.  Assessment:  Primary concerns today: Weight management and lipds management.   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   Usual eating pattern on work days includes:  Up ~11 AM or 12 PM, bkfst ~1 PM; dinner ~5:30; to work at 7; left-overs from dinner at midnight; yogurt, granola, fruit ~3 AM; other snacks from home ~5:30.  Her husband is a good cook, and meals are balanced and satisfying.   She identifies problem times as night time when her husband has gone to bed, and she has 3-4 hrs by herself, usually reading and relaxing.  Usual physical activity includes walking ~30 min 4 X wk.   Frequent foods include yogurt, granola, fruit at least 3 X wk; 2 cups coffee w/ 1-2 tbsp flavored sugar-free creamer at work, Animator, baked goods (homemade).  Avoided foods include none.  Goes out to eat ~3 X mo.   Ms. Dun is a Lifetime member of [employer], and has lost weight successfully 4-5 X, each time re-gaining her wt within a couple yrs.      Did not do 24-hr recall b/c yesterday was finishing up a retreat weekend, so atypical.   Typical meals: B (11 AM)-   Eng muffin, pc of [place] bacon, egg, avocado, & tomato Snk ( AM)-    L ( PM)-  Starch, protein, veg's Snk ( PM)-   D ( PM)-  Starch, protein, veg's Snk ( PM)-  Popcorn w/ butter &/or homemade cookies or cake probably ~4 X wk.    Progress Towards Goal(s):  In progress.   Nutritional Diagnosis:  NI-1.5 Excessive energy intake As related to expenditure.  As evidenced by BMI >30.    Intervention:  Nutrition education.  Monitoring/Evaluation:  Dietary intake, exercise, and body weight in 8 week(s).

## 2013-06-10 ENCOUNTER — Ambulatory Visit: Payer: 59 | Admitting: Family Medicine

## 2013-06-24 ENCOUNTER — Encounter: Payer: 59 | Admitting: Internal Medicine

## 2013-09-16 ENCOUNTER — Telehealth: Payer: Self-pay | Admitting: Internal Medicine

## 2013-09-16 NOTE — Telephone Encounter (Signed)
Patient is getting ready to go out of town.  She does not have anymore refills left on lipitor or meloxicam.  She does have an appt at the end of the month for a cpe.  She is requesting enough to get her by until the office visit.  Can also reach at [phone].

## 2013-09-17 MED ORDER — MELOXICAM 7.5 MG PO TABS: ORAL_TABLET | ORAL | Status: DC

## 2013-09-17 MED ORDER — ATORVASTATIN CALCIUM 20 MG PO TABS: 20.0000 mg | ORAL_TABLET | Freq: Every day | ORAL | Status: DC

## 2013-09-17 NOTE — Telephone Encounter (Signed)
Left patient message

## 2013-09-17 NOTE — Telephone Encounter (Signed)
[facility] rx done for both - thanks

## 2013-10-02 ENCOUNTER — Encounter: Payer: 59 | Admitting: Internal Medicine

## 2013-10-09 ENCOUNTER — Other Ambulatory Visit (INDEPENDENT_AMBULATORY_CARE_PROVIDER_SITE_OTHER): Payer: 59

## 2013-10-09 ENCOUNTER — Encounter: Payer: Self-pay | Admitting: Internal Medicine

## 2013-10-09 ENCOUNTER — Ambulatory Visit (INDEPENDENT_AMBULATORY_CARE_PROVIDER_SITE_OTHER): Payer: 59 | Admitting: Internal Medicine

## 2013-10-09 VITALS — BP 140/90 | HR 83 | Temp 97.5°F | Ht 65.0 in

## 2013-10-09 DIAGNOSIS — Z0000 Encounter for general adult medical examination without abnormal findings: Secondary | ICD-10-CM

## 2013-10-09 DIAGNOSIS — F329 Major depressive disorder, single episode, unspecified: Secondary | ICD-10-CM

## 2013-10-09 DIAGNOSIS — F3289 Other specified depressive episodes: Secondary | ICD-10-CM

## 2013-10-09 LAB — CBC WITH DIFFERENTIAL/PLATELET
BASOS ABS: 0 10*3/uL (ref 0.0–0.1)
Basophils Relative: 0.5 % (ref 0.0–3.0)
EOS ABS: 0.2 10*3/uL (ref 0.0–0.7)
Eosinophils Relative: 3 % (ref 0.0–5.0)
HEMATOCRIT: 42.6 % (ref 36.0–46.0)
HEMOGLOBIN: 14.5 g/dL (ref 12.0–15.0)
LYMPHS ABS: 1.8 10*3/uL (ref 0.7–4.0)
Lymphocytes Relative: 24.4 % (ref 12.0–46.0)
MCHC: 34 g/dL (ref 30.0–36.0)
MCV: 90.3 fl (ref 78.0–100.0)
MONOS PCT: 7.9 % (ref 3.0–12.0)
Monocytes Absolute: 0.6 10*3/uL (ref 0.1–1.0)
NEUTROS ABS: 4.7 10*3/uL (ref 1.4–7.7)
Neutrophils Relative %: 64.2 % (ref 43.0–77.0)
Platelets: 270 10*3/uL (ref 150.0–400.0)
RBC: 4.71 Mil/uL (ref 3.87–5.11)
RDW: 13.8 % (ref 11.5–15.5)
WBC: 7.3 10*3/uL (ref 4.0–10.5)

## 2013-10-09 LAB — BASIC METABOLIC PANEL
BUN: 18 mg/dL (ref 6–23)
CO2: 28 meq/L (ref 19–32)
Calcium: 9 mg/dL (ref 8.4–10.5)
Chloride: 110 mEq/L (ref 96–112)
Creatinine, Ser: 0.7 mg/dL (ref 0.4–1.2)
GFR: 86.14 mL/min (ref >60.00)
GLUCOSE: 113 mg/dL — AB (ref 70–99)
POTASSIUM: 4.2 meq/L (ref 3.5–5.1)
SODIUM: 143 meq/L (ref 135–145)

## 2013-10-09 LAB — HEPATIC FUNCTION PANEL
ALBUMIN: 3.8 g/dL (ref 3.5–5.2)
ALT: 28 U/L (ref 0–35)
AST: 24 U/L (ref 0–37)
Alkaline Phosphatase: 77 U/L (ref 39–117)
Bilirubin, Direct: 0.1 mg/dL (ref 0.0–0.3)
TOTAL PROTEIN: 6.8 g/dL (ref 6.0–8.3)
Total Bilirubin: 0.5 mg/dL (ref 0.2–1.2)

## 2013-10-09 LAB — URINALYSIS, ROUTINE W REFLEX MICROSCOPIC
Hgb urine dipstick: NEGATIVE
KETONES UR: NEGATIVE
LEUKOCYTES UA: NEGATIVE
NITRITE: NEGATIVE
PH: 5 (ref 5.0–8.0)
RBC / HPF: NONE SEEN (ref 0–?)
Total Protein, Urine: NEGATIVE
Urine Glucose: NEGATIVE
Urobilinogen, UA: 0.2 (ref 0.0–1.0)

## 2013-10-09 LAB — LIPID PANEL
CHOLESTEROL: 166 mg/dL (ref 0–200)
HDL: 52.2 mg/dL (ref >39.00)
LDL Cholesterol: 87 mg/dL (ref 0–99)
NonHDL: 113.8
Total CHOL/HDL Ratio: 3
Triglycerides: 134 mg/dL (ref 0.0–149.0)
VLDL: 26.8 mg/dL (ref 0.0–40.0)

## 2013-10-09 LAB — TSH: TSH: 2.83 u[IU]/mL (ref 0.35–4.50)

## 2013-10-09 MED ORDER — BUPROPION HCL ER (XL) 150 MG PO TB24: 150.0000 mg | ORAL_TABLET | Freq: Every day | ORAL | Status: DC

## 2013-10-09 MED ORDER — MELOXICAM 7.5 MG PO TABS: ORAL_TABLET | ORAL | Status: DC

## 2013-10-09 MED ORDER — ATORVASTATIN CALCIUM 20 MG PO TABS: 20.0000 mg | ORAL_TABLET | Freq: Every day | ORAL | Status: DC

## 2013-10-09 NOTE — Patient Instructions (Addendum)
It was good to see you today.  We have reviewed your prior records including labs and tests today  Health Maintenance reviewed - all recommended immunizations and age-appropriate screenings are up-to-date.  Test(s) ordered today. Your results will be released to [place] (or called to you) after review, usually within 72hours after test completion. If any changes need to be made, you will be notified at that same time.  Medications reviewed and updated Change prozac to wellbutin - no other changes recommended at this time. Your prescription(s)/refills have been submitted to your pharmacy. Please take as directed and contact our office if you believe you are having problem(s) with the medication(s).  Please schedule followup in 12 months for annual exam and labs, call sooner if problems.  Health Maintenance Adopting a healthy lifestyle and getting preventive care can go a long way to promote health and wellness. Talk with your health care provider about what schedule of regular examinations is right for you. This is a good chance for you to check in with your provider about disease prevention and staying healthy. In between checkups, there are plenty of things you can do on your own. Experts have done a lot of research about which lifestyle changes and preventive measures are most likely to keep you healthy. Ask your health care provider for more information. WEIGHT AND DIET  Eat a healthy diet  Be sure to include plenty of vegetables, fruits, low-fat dairy products, and lean protein.  Do not eat a lot of foods high in solid fats, added sugars, or salt.  Get regular exercise. This is one of the most important things you can do for your health.  Most adults should exercise for at least 150 minutes each week. The exercise should increase your heart rate and make you sweat (moderate-intensity exercise).  Most adults should also do strengthening exercises at least twice a week. This is in  addition to the moderate-intensity exercise.  Maintain a healthy weight  Body mass index (BMI) is a measurement that can be used to identify possible weight problems. It estimates body fat based on height and weight. Your health care provider can help determine your BMI and help you achieve or maintain a healthy weight.  For females 52 years of age and older:   A BMI below 18.5 is considered underweight.  A BMI of 18.5 to 24.9 is normal.  A BMI of 25 to 29.9 is considered overweight.  A BMI of 30 and above is considered obese.  Watch levels of cholesterol and blood lipids  You should start having your blood tested for lipids and cholesterol at 61 years of age, then have this test every 5 years.  You may need to have your cholesterol levels checked more often if:  Your lipid or cholesterol levels are high.  You are older than 61 years of age.  You are at high risk for heart disease.  CANCER SCREENING   Lung Cancer  Lung cancer screening is recommended for adults 109-15 years old who are at high risk for lung cancer because of a history of smoking.  A yearly low-dose CT scan of the lungs is recommended for people who:  Currently smoke.  Have quit within the past 15 years.  Have at least a 30-pack-year history of smoking. A pack year is smoking an average of one pack of cigarettes a day for 1 year.  Yearly screening should continue until it has been 15 years since you quit.  Yearly screening should  stop if you develop a health problem that would prevent you from having lung cancer treatment.  Breast Cancer  Practice breast self-awareness. This means understanding how your breasts normally appear and feel.  It also means doing regular breast self-exams. Let your health care provider know about any changes, no matter how small.  If you are in your 20s or 30s, you should have a clinical breast exam (CBE) by a health care provider every 1-3 years as part of a regular  health exam.  If you are 48 or older, have a CBE every year. Also consider having a breast X-ray (mammogram) every year.  If you have a family history of breast cancer, talk to your health care provider about genetic screening.  If you are at high risk for breast cancer, talk to your health care provider about having an MRI and a mammogram every year.  Breast cancer gene (BRCA) assessment is recommended for women who have family members with BRCA-related cancers. BRCA-related cancers include:  Breast.  Ovarian.  Tubal.  Peritoneal cancers.  Results of the assessment will determine the need for genetic counseling and BRCA1 and BRCA2 testing. Cervical Cancer Routine pelvic examinations to screen for cervical cancer are no longer recommended for nonpregnant women who are considered low risk for cancer of the pelvic organs (ovaries, uterus, and vagina) and who do not have symptoms. A pelvic examination may be necessary if you have symptoms including those associated with pelvic infections. Ask your health care provider if a screening pelvic exam is right for you.   The Pap test is the screening test for cervical cancer for women who are considered at risk.  If you had a hysterectomy for a problem that was not cancer or a condition that could lead to cancer, then you no longer need Pap tests.  If you are older than 65 years, and you have had normal Pap tests for the past 10 years, you no longer need to have Pap tests.  If you have had past treatment for cervical cancer or a condition that could lead to cancer, you need Pap tests and screening for cancer for at least 20 years after your treatment.  If you no longer get a Pap test, assess your risk factors if they change (such as having a new sexual partner). This can affect whether you should start being screened again.  Some women have medical problems that increase their chance of getting cervical cancer. If this is the case for you, your  health care provider may recommend more frequent screening and Pap tests.  The human papillomavirus (HPV) test is another test that may be used for cervical cancer screening. The HPV test looks for the virus that can cause cell changes in the cervix. The cells collected during the Pap test can be tested for HPV.  The HPV test can be used to screen women 3 years of age and older. Getting tested for HPV can extend the interval between normal Pap tests from three to five years.  An HPV test also should be used to screen women of any age who have unclear Pap test results.  After 61 years of age, women should have HPV testing as often as Pap tests.  Colorectal Cancer  This type of cancer can be detected and often prevented.  Routine colorectal cancer screening usually begins at 61 years of age and continues through 61 years of age.  Your health care provider may recommend screening at an earlier age  if you have risk factors for colon cancer.  Your health care provider may also recommend using home test kits to check for hidden blood in the stool.  A small camera at the end of a tube can be used to examine your colon directly (sigmoidoscopy or colonoscopy). This is done to check for the earliest forms of colorectal cancer.  Routine screening usually begins at age 45.  Direct examination of the colon should be repeated every 5-10 years through 61 years of age. However, you may need to be screened more often if early forms of precancerous polyps or small growths are found. Skin Cancer  Check your skin from head to toe regularly.  Tell your health care provider about any new moles or changes in moles, especially if there is a change in a mole's shape or color.  Also tell your health care provider if you have a mole that is larger than the size of a pencil eraser.  Always use sunscreen. Apply sunscreen liberally and repeatedly throughout the day.  Protect yourself by wearing long sleeves,  pants, a wide-brimmed hat, and sunglasses whenever you are outside. HEART DISEASE, DIABETES, AND HIGH BLOOD PRESSURE   Have your blood pressure checked at least every 1-2 years. High blood pressure causes heart disease and increases the risk of stroke.  If you are between 40 years and 50 years old, ask your health care provider if you should take aspirin to prevent strokes.  Have regular diabetes screenings. This involves taking a blood sample to check your fasting blood sugar level.  If you are at a normal weight and have a low risk for diabetes, have this test once every three years after 61 years of age.  If you are overweight and have a high risk for diabetes, consider being tested at a younger age or more often. PREVENTING INFECTION  Hepatitis B  If you have a higher risk for hepatitis B, you should be screened for this virus. You are considered at high risk for hepatitis B if:  You were born in a country where hepatitis B is common. Ask your health care provider which countries are considered high risk.  Your parents were born in a high-risk country, and you have not been immunized against hepatitis B (hepatitis B vaccine).  You have HIV or AIDS.  You use needles to inject street drugs.  You live with someone who has hepatitis B.  You have had sex with someone who has hepatitis B.  You get hemodialysis treatment.  You take certain medicines for conditions, including cancer, organ transplantation, and autoimmune conditions. Hepatitis C  Blood testing is recommended for:  Everyone born from [birth year] through 1963.  Anyone with known risk factors for hepatitis C. Sexually transmitted infections (STIs)  You should be screened for sexually transmitted infections (STIs) including gonorrhea and chlamydia if:  You are sexually active and are younger than 61 years of age.  You are older than 61 years of age and your health care provider tells you that you are at risk for this  type of infection.  Your sexual activity has changed since you were last screened and you are at an increased risk for chlamydia or gonorrhea. Ask your health care provider if you are at risk.  If you do not have HIV, but are at risk, it may be recommended that you take a prescription medicine daily to prevent HIV infection. This is called pre-exposure prophylaxis (PrEP). You are considered at risk if:  You are sexually active and do not regularly use condoms or know the HIV status of your partner(s).  You take drugs by injection.  You are sexually active with a partner who has HIV. Talk with your health care provider about whether you are at high risk of being infected with HIV. If you choose to begin PrEP, you should first be tested for HIV. You should then be tested every 3 months for as long as you are taking PrEP.  PREGNANCY   If you are premenopausal and you may become pregnant, ask your health care provider about preconception counseling.  If you may become pregnant, take 400 to 800 micrograms (mcg) of folic acid every day.  If you want to prevent pregnancy, talk to your health care provider about birth control (contraception). OSTEOPOROSIS AND MENOPAUSE   Osteoporosis is a disease in which the bones lose minerals and strength with aging. This can result in serious bone fractures. Your risk for osteoporosis can be identified using a bone density scan.  If you are 59 years of age or older, or if you are at risk for osteoporosis and fractures, ask your health care provider if you should be screened.  Ask your health care provider whether you should take a calcium or vitamin D supplement to lower your risk for osteoporosis.  Menopause may have certain physical symptoms and risks.  Hormone replacement therapy may reduce some of these symptoms and risks. Talk to your health care provider about whether hormone replacement therapy is right for you.  HOME CARE INSTRUCTIONS   Schedule  regular health, dental, and eye exams.  Stay current with your immunizations.   Do not use any tobacco products including cigarettes, chewing tobacco, or electronic cigarettes.  If you are pregnant, do not drink alcohol.  If you are breastfeeding, limit how much and how often you drink alcohol.  Limit alcohol intake to no more than 1 drink per day for nonpregnant women. One drink equals 12 ounces of beer, 5 ounces of wine, or 1 ounces of hard liquor.  Do not use street drugs.  Do not share needles.  Ask your health care provider for help if you need support or information about quitting drugs.  Tell your health care provider if you often feel depressed.  Tell your health care provider if you have ever been abused or do not feel safe at home. Document Released: 09/13/2010 Document Revised: 07/15/2013 Document Reviewed: 01/30/2013 [facility] Patient Information 2015 [place], [place]. This information is not intended to replace advice given to you by your health care provider. Make sure you discuss any questions you have with your health care provider.

## 2013-10-09 NOTE — Assessment & Plan Note (Signed)
On Prozac for years Reports mild increase in symptoms manifest as physical and emotional fatigue denies SI/HI Discussed potential options for medical therapy and patient feels ready for change Stop Prozac, begin Wellbutrin XR 1100m qd -erx done we reviewed potential risk/benefit and possible side effects - pt understands and agrees to same  Patient to touch base and 6-8 weeks regarding symptoms, patient agrees to call sooner if problems

## 2013-10-09 NOTE — Progress Notes (Signed)
Pre visit review using our clinic review tool, if applicable. No additional management support is needed unless otherwise documented below in the visit note. 

## 2013-10-09 NOTE — Progress Notes (Signed)
Subjective:    Patient ID: Victoria Stone, female    DOB: Feb 07, 1951, 63 y.o.   MRN: [id number]  HPI  patient is here today for annual physical. Patient feels well and has no complaints.  Also reviewed chronic medical issues and interval medical events  Past Medical History  Diagnosis Date  . ALLERGIC RHINITIS   . ARTHRITIS   . ULCERATIVE COLITIS   . Dyslipidemia   . Obese    Family History  Problem Relation Age of Onset  . Hypertension Mother   . Hypertension Father   . Hypertension Sister   . Diabetes Sister   . Heart disease Brother   . Hypertension Maternal Grandmother   . Hypertension Paternal Grandmother   . Hypertension Paternal Grandfather   . Heart disease Father   . Heart attack Sister   . Breast cancer Sister    History  Substance Use Topics  . Smoking status: Never Smoker   . 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 Alcohol Use: No     Comment: occasionally    Review of Systems  Constitutional: Positive for fatigue (physical and emotional x 62mo. Negative for unexpected weight change.  Respiratory: Negative for cough, shortness of breath and wheezing.   Cardiovascular: Negative for chest pain, palpitations and leg swelling.  Gastrointestinal: Negative for nausea, abdominal pain and diarrhea.  Neurological: Negative for dizziness, weakness, light-headedness and headaches.  Psychiatric/Behavioral: Negative for dysphoric mood. The patient is not nervous/anxious.   All other systems reviewed and are negative.      Objective:   Physical Exam  BP 140/90  Pulse 83  Temp(Src) 97.5 F (36.4 C) (Oral)  Ht 5' 5"  (1.651 m)  SpO2 96% Wt Readings from Last 3 Encounters:  04/08/13 196 lb 14.4 oz (89.313 kg)  02/20/12 161 lb 1.9 oz (73.084 kg)  02/14/11 192 lb (87.091 kg)   Constitutional: She is overweight, appears well-developed and well-nourished. No distress.  HENT: Head: Normocephalic and atraumatic.  Ears: B TMs ok, no erythema or effusion; Nose: Nose normal. Mouth/Throat: Oropharynx is clear and moist. No oropharyngeal exudate.  Eyes: Conjunctivae and EOM are normal. Pupils are equal, round, and reactive to light. No scleral icterus.  Neck: Normal range of motion. Neck supple. No JVD present. No thyromegaly present.  Cardiovascular: Normal rate, regular rhythm and normal heart sounds.  No murmur heard. No BLE edema. Pulmonary/Chest: Effort normal and breath sounds normal. No respiratory distress. She has no wheezes.  Abdominal: Soft. Bowel sounds are normal. She exhibits no distension. There is no tenderness. no masses GU/breast: defer to gyn Musculoskeletal: Normal range of motion, no joint effusions. No gross deformities Neurological: She is alert and oriented to person, place, and time. No cranial nerve deficit. Coordination, balance, strength, speech and gait are normal.  Skin: Skin is warm and dry. No rash noted. No erythema.  Psychiatric: She has a normal mood and affect. Her behavior is normal. Judgment and thought content normal.    Lab Results  Component Value Date   WBC 7.6 12/03/2010   HGB 14.2 12/03/2010   HCT 42.4 12/03/2010   PLT 285.0 12/03/2010   GLUCOSE 96 12/03/2010   CHOL 156 02/13/2012   TRIG 86.0 02/13/2012   HDL 72.40 02/13/2012   LDLCALC 66 02/13/2012   ALT 42* 02/13/2012   AST 31 02/13/2012   NA 142 12/03/2010   K 4.2 12/03/2010   CL 105 12/03/2010  CREATININE 0.8 12/03/2010   BUN 22 12/03/2010   CO2 30 12/03/2010   TSH 3.14 02/13/2012    Mm Digital Screening 3d Tomo  01/17/2013   CLINICAL DATA:  Screening.  EXAM: DIGITAL SCREENING BILATERAL MAMMOGRAM WITH 3D TOMO WITH CAD  COMPARISON:  Previous exam(s).  ACR Breast Density Category b: There are scattered areas of fibroglandular density.  FINDINGS: There are no findings suspicious for malignancy. Images were processed with CAD.  IMPRESSION: No mammographic evidence of malignancy. A result letter of this screening  mammogram will be mailed directly to the patient.  RECOMMENDATION: Screening mammogram in one year. (Code:SM-B-01Y)  BI-RADS CATEGORY  1: Negative   Electronically Signed   By: Shon Hale M.D.   On: 01/17/2013 08:12       Assessment & Plan:   CPX/v70.0 - Patient has been counseled on age-appropriate routine health concerns for screening and prevention. These are reviewed and up-to-date. Immunizations are up-to-date or declined. Labs ordered and reviewed.  Problem List Items Addressed This Visit   DEPRESSION     On Prozac for years Reports mild increase in symptoms manifest as physical and emotional fatigue denies SI/HI Discussed potential options for medical therapy and patient feels ready for change Stop Prozac, begin Wellbutrin XR 171m qd -erx done we reviewed potential risk/benefit and possible side effects - pt understands and agrees to same  Patient to touch base and 6-8 weeks regarding symptoms, patient agrees to call sooner if problems    Relevant Medications      buPROPion (WELLBUTRIN XL) 24 hr tablet    Other Visit Diagnoses   Routine general medical examination at a health care facility    -  Primary    Relevant Orders       Basic metabolic panel       CBC with Differential       Hepatic function panel       Lipid panel       TSH       Urinalysis, Routine w reflex microscopic

## 2013-12-13 ENCOUNTER — Other Ambulatory Visit (HOSPITAL_COMMUNITY): Payer: Self-pay | Admitting: Obstetrics and Gynecology

## 2013-12-13 DIAGNOSIS — Z1231 Encounter for screening mammogram for malignant neoplasm of breast: Secondary | ICD-10-CM

## 2014-01-22 ENCOUNTER — Ambulatory Visit (HOSPITAL_COMMUNITY): Payer: 59

## 2014-02-05 ENCOUNTER — Ambulatory Visit (HOSPITAL_COMMUNITY)
Admission: RE | Admit: 2014-02-05 | Discharge: 2014-02-05 | Disposition: A | Discharge status: Home / Self Care | Payer: 59 | Source: Ambulatory Visit | Attending: Obstetrics and Gynecology | Admitting: Obstetrics and Gynecology

## 2014-02-05 DIAGNOSIS — Z1231 Encounter for screening mammogram for malignant neoplasm of breast: Secondary | ICD-10-CM | POA: Diagnosis not present

## 2014-02-05 IMAGING — MG MM SCREENING BREAST TOMO BILAT
8 series · 8 of 24 positions shown · non-contrast
Comparison: Previous exam(s).

CLINICAL DATA: Screening.

EXAM:
DIGITAL SCREENING BILATERAL MAMMOGRAM WITH 3D TOMO WITH CAD

[R MLO]
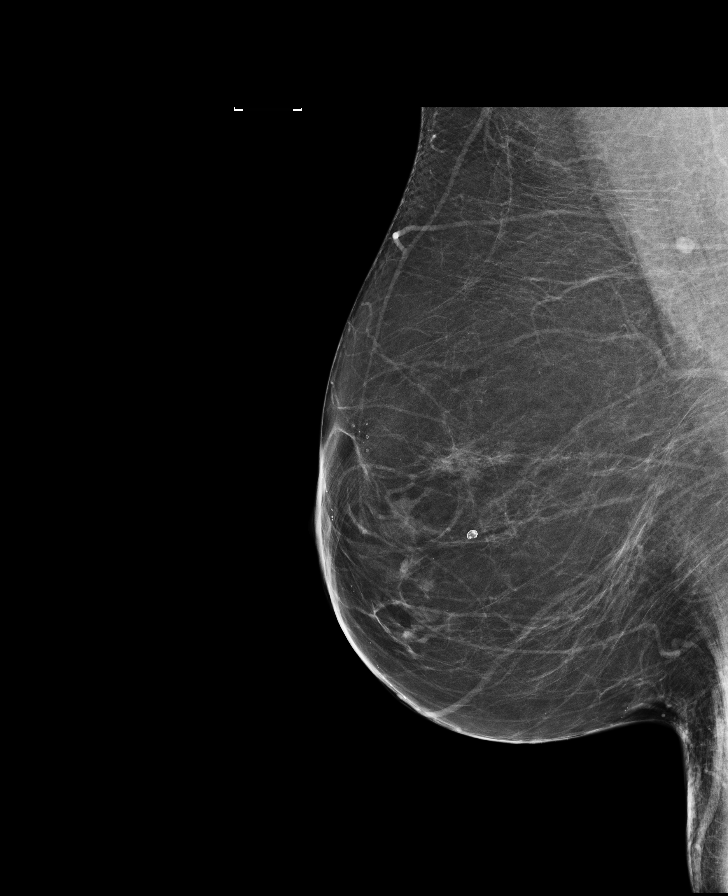

[L MLO]
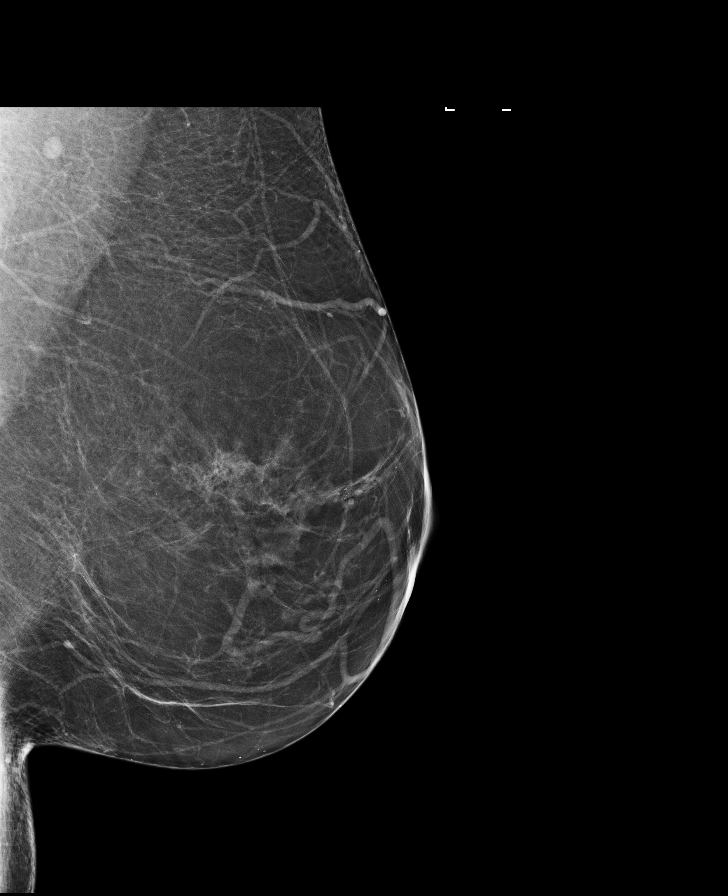

[R CC]
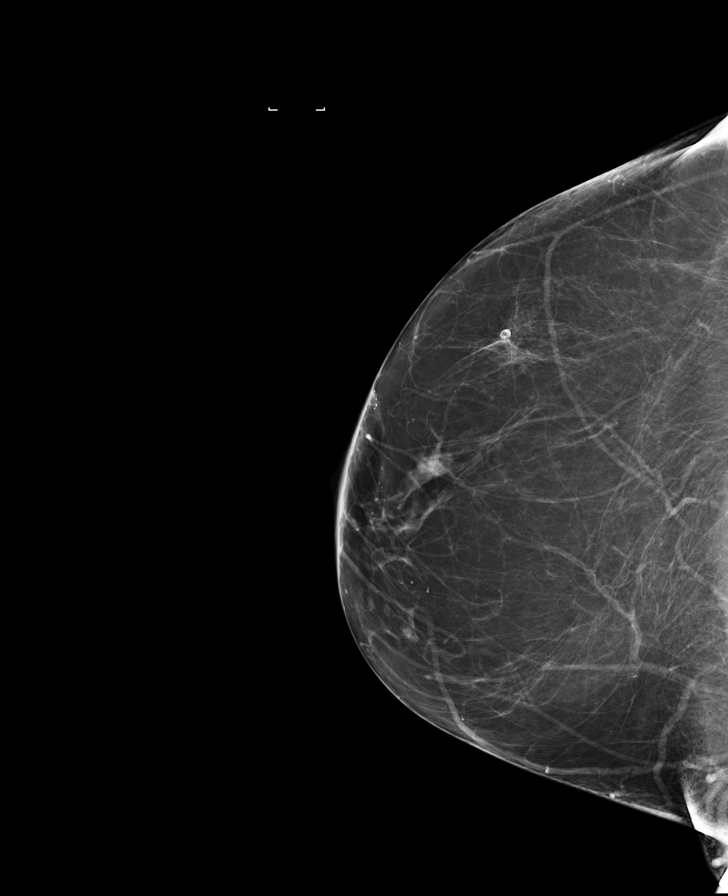

[L CC]
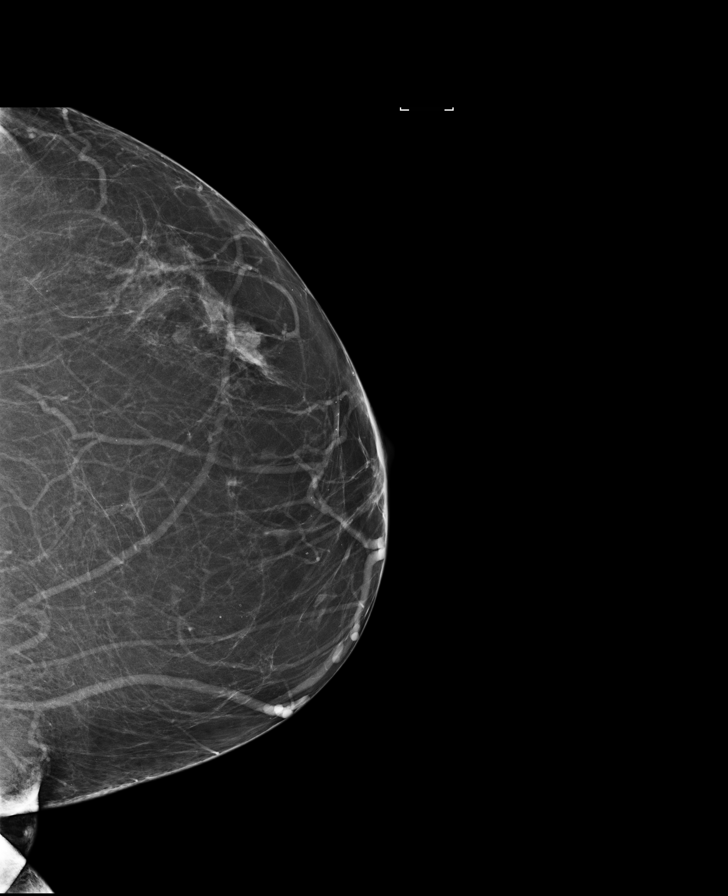

[R CC tomo · tomo slice 41/81.0]
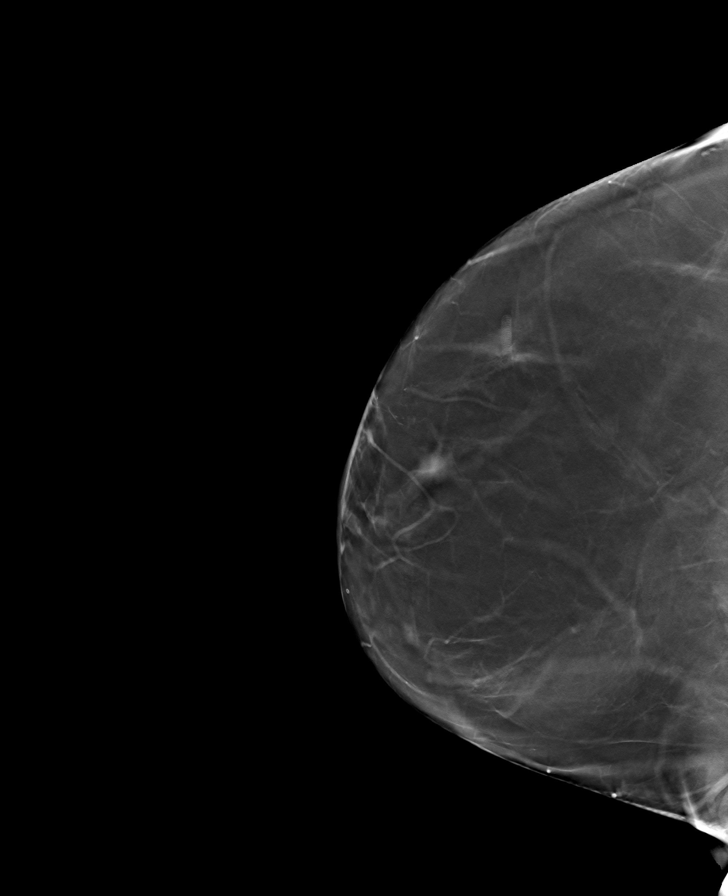

[L CC tomo · tomo slice 41/81.0]
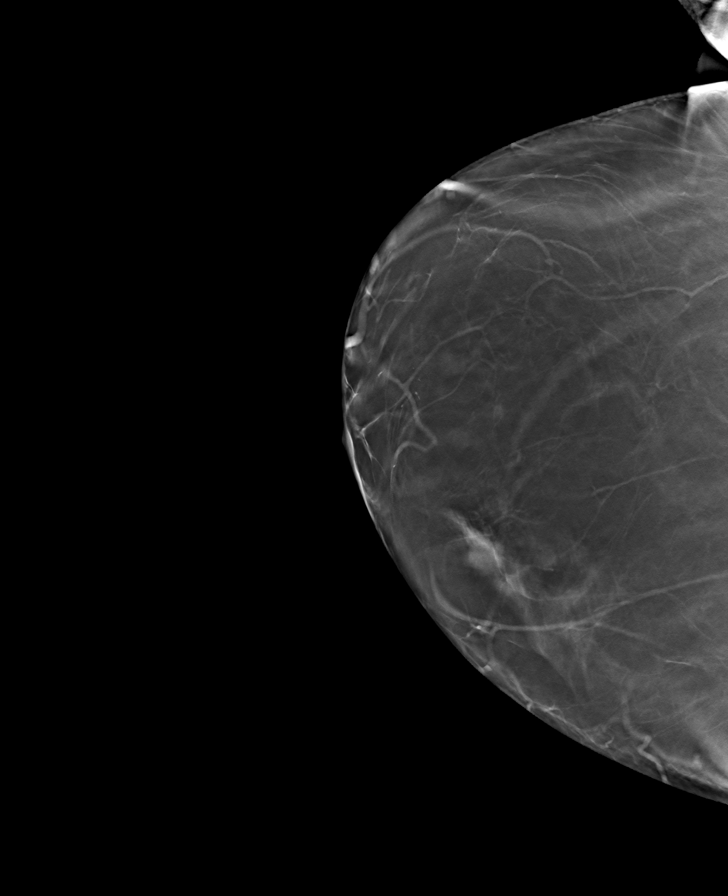

[L MLO tomo · tomo slice 43/84.0]
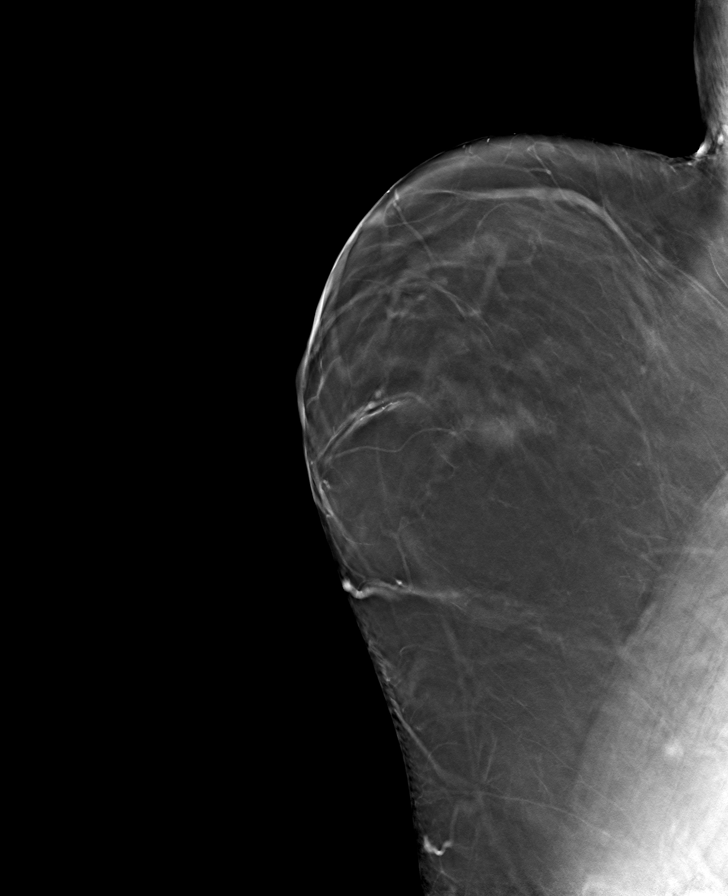

[R MLO tomo · tomo slice 42/83.0]
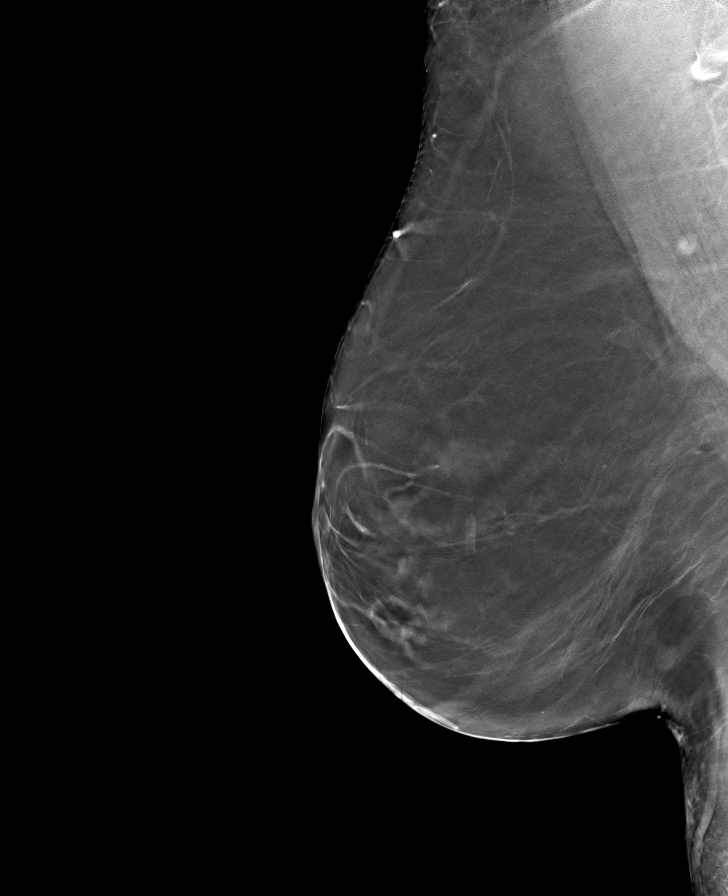

[8 of 24 positions shown; findings below may reference images not displayed]

ACR Breast Density Category b: There are scattered areas of
fibroglandular density.
FINDINGS: There are no findings suspicious for malignancy. Images were
processed with CAD.
IMPRESSION: No mammographic evidence of malignancy. A result letter of this
screening mammogram will be mailed directly to the patient.

RECOMMENDATION:
Screening mammogram in one year. (Code:[F0])

BI-RADS CATEGORY  1: Negative.

## 2014-03-23 ENCOUNTER — Encounter: Payer: Self-pay | Admitting: Internal Medicine

## 2014-03-24 MED ORDER — FLUOXETINE HCL 20 MG PO TABS
20.0000 mg | ORAL_TABLET | Freq: Every day | ORAL | Status: DC
Start: 2014-03-24 — End: 2014-04-02

## 2014-04-02 ENCOUNTER — Other Ambulatory Visit: Payer: Self-pay

## 2014-04-02 MED ORDER — FLUOXETINE HCL 20 MG PO CAPS: 20.0000 mg | ORAL_CAPSULE | Freq: Every day | ORAL | Status: DC

## 2014-11-06 ENCOUNTER — Other Ambulatory Visit (INDEPENDENT_AMBULATORY_CARE_PROVIDER_SITE_OTHER): Payer: Self-pay | Admitting: Physician Assistant

## 2014-11-06 ENCOUNTER — Ambulatory Visit (HOSPITAL_COMMUNITY)
Admission: RE | Admit: 2014-11-06 | Discharge: 2014-11-06 | Disposition: A | Discharge status: Home / Self Care | Payer: 59 | Source: Ambulatory Visit | Attending: Physician Assistant | Admitting: Physician Assistant

## 2014-11-06 DIAGNOSIS — Z9884 Bariatric surgery status: Secondary | ICD-10-CM | POA: Insufficient documentation

## 2014-11-07 ENCOUNTER — Ambulatory Visit (INDEPENDENT_AMBULATORY_CARE_PROVIDER_SITE_OTHER): Payer: 59 | Admitting: Internal Medicine

## 2014-11-07 ENCOUNTER — Encounter: Payer: Self-pay | Admitting: Internal Medicine

## 2014-11-07 ENCOUNTER — Other Ambulatory Visit: Payer: Self-pay | Admitting: Internal Medicine

## 2014-11-07 ENCOUNTER — Other Ambulatory Visit: Payer: 59

## 2014-11-07 VITALS — BP 138/92 | HR 81 | Temp 97.9°F | Resp 16 | Wt 183.0 lb

## 2014-11-07 DIAGNOSIS — Z8249 Family history of ischemic heart disease and other diseases of the circulatory system: Secondary | ICD-10-CM

## 2014-11-07 DIAGNOSIS — E785 Hyperlipidemia, unspecified: Secondary | ICD-10-CM

## 2014-11-07 DIAGNOSIS — R0789 Other chest pain: Secondary | ICD-10-CM | POA: Diagnosis not present

## 2014-11-07 MED ORDER — RANITIDINE HCL 150 MG PO TABS: 150.0000 mg | ORAL_TABLET | Freq: Two times a day (BID) | ORAL | Status: DC

## 2014-11-07 NOTE — Progress Notes (Signed)
Pre visit review using our clinic review tool, if applicable. No additional management support is needed unless otherwise documented below in the visit note. 

## 2014-11-07 NOTE — Patient Instructions (Signed)
Reflux of gastric acid may be asymptomatic as this may occur mainly during sleep.The triggers for reflux  include stress; the "aspirin family" ; alcohol; peppermint; and caffeine (coffee, tea, cola, and chocolate). The aspirin family would include aspirin and the nonsteroidal agents such as ibuprofen &  Naproxen. Tylenol would not cause reflux. If having symptoms ; food & drink should be avoided for @ least 2 hours before going to bed.

## 2014-11-07 NOTE — Progress Notes (Signed)
   Subjective:    Patient ID: Victoria Stone, female    DOB: 1951-01-16, 64 y.o.   MRN: [id number]  HPI  For the last 6-7 months she describes intermittent pressure and tightness in the substernal area without associated frank pain. She cannot quantitate this as to a 10 scale; she describes it as "strange". It is nonradiating and not associated with nausea or sweating. It is associated with sensation that she needs to take a deep breath. It occurs mainly at night when supine. She attributes it to stress as she is working 12 hour shifts 3-4 times a week sequentially. The symptoms did improve while she was on vacation.  She's on a heart healthy diet. She walks 3-4 times per week for 30 minutes without cardiopulmonary symptoms. She also swims 3-4 times per week in the Summer.  She is on 81 mg of aspirin. She also takes Mobic almost daily. She'll have up to 4 cups of coffee a week. She drinks socially,on average every 2 weeks. She's never smoked.  Her brother had a heart attack at 34; father at 37; and sister at 73.  She is on atorvastatin 20 mg every other day.  Review of Systems  Palpitations, tachycardia, exertional dyspnea, paroxysmal nocturnal dyspnea, claudication or edema are absent. No unexplained weight loss, abdominal pain, significant dyspepsia, dysphagia, melena, rectal bleeding, or persistently small caliber stools. Dysuria, pyuria, hematuria, frequency, nocturia or polyuria are denied. Change in hair, skin, nails denied. No bowel changes of constipation or diarrhea. No intolerance to heat or cold.     Objective:   Physical Exam  Pertinent or positive findings include: There is thinning of eyebrows laterally. She has arcus senilis.  General appearance :adequately nourished; in no distress.  Eyes: No conjunctival inflammation or scleral icterus is present.  Oral exam:  Lips and gums are healthy appearing.There is no oropharyngeal erythema or exudate noted. Dental hygiene is  good.  Heart:  Normal rate and regular rhythm. S1 and S2 normal without gallop, murmur, click, rub or other extra sounds    Lungs:Chest clear to auscultation; no wheezes, rhonchi,rales ,or rubs present.No increased work of breathing.   Abdomen: bowel sounds normal, soft and non-tender without masses, organomegaly or hernias noted.  No guarding or rebound.   Vascular : all pulses equal ; no bruits present.  Skin:Warm & dry.  Intact without suspicious lesions or rashes ; no tenting or jaundice   Lymphatic: No lymphadenopathy is noted about the head, neck, axilla.   Neuro: Strength, tone & DTRs normal.  EKG reveals an incomplete right bundle branch block with no ischemic changes.         Assessment & Plan:  #1 rest chest pain, mainly at night. Major trigger is stress; but she's also on low-dose aspirin and Mobic.  #2 dyslipidemia on every other day statin  #3 incredibly strong family history of premature coronary disease.  Plan: See orders and recommendations. Although the history suggests reflux ;stress testing is indicated. Advanced lipid testing will be performed because of the strong family history.

## 2014-11-09 LAB — NMR LIPOPROFILE WITH LIPIDS
Cholesterol, Total: 189 mg/dL (ref 100–199)
HDL Particle Number: 35.4 umol/L (ref >=30.5)
HDL SIZE: 9.1 nm — AB (ref >=9.2)
HDL-C: 62 mg/dL (ref >39)
LARGE VLDL-P: 2 nmol/L (ref <=2.7)
LDL CALC: 95 mg/dL (ref 0–99)
LDL PARTICLE NUMBER: 1077 nmol/L — AB (ref <1000)
LDL SIZE: 20.9 nm (ref >=20.8)
LP-IR SCORE: 36 (ref <=45)
Large HDL-P: 7.4 umol/L (ref >=4.8)
SMALL LDL PARTICLE NUMBER: 391 nmol/L (ref <=527)
Triglycerides: 159 mg/dL — ABNORMAL HIGH (ref 0–149)
VLDL SIZE: 41.9 nm (ref <=46.6)

## 2014-11-10 ENCOUNTER — Encounter: Payer: Self-pay | Admitting: Internal Medicine

## 2014-11-10 ENCOUNTER — Telehealth (HOSPITAL_COMMUNITY): Payer: Self-pay | Admitting: *Deleted

## 2014-11-12 ENCOUNTER — Other Ambulatory Visit: Payer: Self-pay | Admitting: Internal Medicine

## 2014-11-12 DIAGNOSIS — E785 Hyperlipidemia, unspecified: Secondary | ICD-10-CM

## 2014-11-12 DIAGNOSIS — Z8249 Family history of ischemic heart disease and other diseases of the circulatory system: Secondary | ICD-10-CM

## 2014-11-13 ENCOUNTER — Ambulatory Visit: Payer: 59 | Admitting: Internal Medicine

## 2014-12-16 ENCOUNTER — Telehealth (HOSPITAL_COMMUNITY): Payer: Self-pay

## 2014-12-16 NOTE — Telephone Encounter (Signed)
Encounter complete. 

## 2014-12-17 ENCOUNTER — Telehealth (HOSPITAL_COMMUNITY): Payer: Self-pay

## 2014-12-17 NOTE — Telephone Encounter (Signed)
Encounter complete. 

## 2014-12-18 ENCOUNTER — Ambulatory Visit (HOSPITAL_COMMUNITY)
Admission: RE | Admit: 2014-12-18 | Discharge: 2014-12-18 | Disposition: A | Discharge status: Home / Self Care | Payer: 59 | Source: Ambulatory Visit | Attending: Internal Medicine | Admitting: Internal Medicine

## 2014-12-18 DIAGNOSIS — E785 Hyperlipidemia, unspecified: Secondary | ICD-10-CM | POA: Insufficient documentation

## 2014-12-18 DIAGNOSIS — Z8249 Family history of ischemic heart disease and other diseases of the circulatory system: Secondary | ICD-10-CM | POA: Insufficient documentation

## 2014-12-18 LAB — EXERCISE TOLERANCE TEST
CSEPED: 9 min
CSEPEW: 10.5 METS
CSEPPHR: 144 {beats}/min
Exercise duration (sec): 17 s
MPHR: 158 {beats}/min
Percent HR: 91 %
RPE: 16
Rest HR: 68 {beats}/min

## 2015-01-22 ENCOUNTER — Other Ambulatory Visit: Payer: Self-pay | Admitting: Internal Medicine

## 2015-02-02 ENCOUNTER — Ambulatory Visit (INDEPENDENT_AMBULATORY_CARE_PROVIDER_SITE_OTHER)
Admission: RE | Admit: 2015-02-02 | Discharge: 2015-02-02 | Disposition: A | Discharge status: Home / Self Care | Payer: 59 | Source: Ambulatory Visit | Attending: Internal Medicine | Admitting: Internal Medicine

## 2015-02-02 ENCOUNTER — Other Ambulatory Visit: Payer: Self-pay

## 2015-02-02 ENCOUNTER — Other Ambulatory Visit (INDEPENDENT_AMBULATORY_CARE_PROVIDER_SITE_OTHER): Payer: 59

## 2015-02-02 ENCOUNTER — Encounter: Payer: Self-pay | Admitting: Internal Medicine

## 2015-02-02 ENCOUNTER — Ambulatory Visit (INDEPENDENT_AMBULATORY_CARE_PROVIDER_SITE_OTHER): Payer: 59 | Admitting: Internal Medicine

## 2015-02-02 VITALS — BP 110/80 | HR 69 | Temp 97.6°F | Ht 65.0 in | Wt 176.0 lb

## 2015-02-02 DIAGNOSIS — Z1159 Encounter for screening for other viral diseases: Secondary | ICD-10-CM

## 2015-02-02 DIAGNOSIS — Z1231 Encounter for screening mammogram for malignant neoplasm of breast: Secondary | ICD-10-CM

## 2015-02-02 DIAGNOSIS — R079 Chest pain, unspecified: Secondary | ICD-10-CM

## 2015-02-02 DIAGNOSIS — Z0000 Encounter for general adult medical examination without abnormal findings: Secondary | ICD-10-CM | POA: Diagnosis not present

## 2015-02-02 DIAGNOSIS — F4323 Adjustment disorder with mixed anxiety and depressed mood: Secondary | ICD-10-CM

## 2015-02-02 DIAGNOSIS — R0789 Other chest pain: Secondary | ICD-10-CM

## 2015-02-02 LAB — LIPID PANEL
CHOL/HDL RATIO: 3
CHOLESTEROL: 134 mg/dL (ref 0–200)
HDL: 48.6 mg/dL (ref >39.00)
LDL Cholesterol: 67 mg/dL (ref 0–99)
NonHDL: 85.63
TRIGLYCERIDES: 93 mg/dL (ref 0.0–149.0)
VLDL: 18.6 mg/dL (ref 0.0–40.0)

## 2015-02-02 LAB — URINALYSIS, ROUTINE W REFLEX MICROSCOPIC
Hgb urine dipstick: NEGATIVE
LEUKOCYTES UA: NEGATIVE
NITRITE: NEGATIVE
PH: 5 (ref 5.0–8.0)
RBC / HPF: NONE SEEN (ref 0–?)
TOTAL PROTEIN, URINE-UPE24: NEGATIVE
URINE GLUCOSE: NEGATIVE
UROBILINOGEN UA: 1 (ref 0.0–1.0)

## 2015-02-02 LAB — BASIC METABOLIC PANEL
BUN: 22 mg/dL (ref 6–23)
CALCIUM: 9.7 mg/dL (ref 8.4–10.5)
CO2: 28 meq/L (ref 19–32)
CREATININE: 0.76 mg/dL (ref 0.40–1.20)
Chloride: 108 mEq/L (ref 96–112)
GFR: 81.88 mL/min (ref >60.00)
Glucose, Bld: 97 mg/dL (ref 70–99)
Potassium: 4.6 mEq/L (ref 3.5–5.1)
Sodium: 142 mEq/L (ref 135–145)

## 2015-02-02 LAB — HEPATIC FUNCTION PANEL
ALBUMIN: 4.2 g/dL (ref 3.5–5.2)
ALT: 34 U/L (ref 0–35)
AST: 31 U/L (ref 0–37)
Alkaline Phosphatase: 102 U/L (ref 39–117)
BILIRUBIN DIRECT: 0.1 mg/dL (ref 0.0–0.3)
TOTAL PROTEIN: 7.2 g/dL (ref 6.0–8.3)
Total Bilirubin: 0.3 mg/dL (ref 0.2–1.2)

## 2015-02-02 LAB — TSH: TSH: 2.54 u[IU]/mL (ref 0.35–4.50)

## 2015-02-02 LAB — HEPATITIS C ANTIBODY: HCV Ab: NEGATIVE

## 2015-02-02 MED ORDER — TRIAMCINOLONE ACETONIDE 0.5 % EX OINT: TOPICAL_OINTMENT | Freq: Two times a day (BID) | CUTANEOUS | Status: DC | PRN

## 2015-02-02 MED ORDER — ATORVASTATIN CALCIUM 20 MG PO TABS: 20.0000 mg | ORAL_TABLET | Freq: Every day | ORAL | Status: DC

## 2015-02-02 MED ORDER — RANITIDINE HCL 150 MG PO TABS: 150.0000 mg | ORAL_TABLET | Freq: Every day | ORAL | Status: DC

## 2015-02-02 MED ORDER — FLUOXETINE HCL 20 MG PO CAPS: 20.0000 mg | ORAL_CAPSULE | Freq: Every day | ORAL | Status: DC

## 2015-02-02 NOTE — Progress Notes (Signed)
Subjective:    Patient ID: Victoria Stone, female    DOB: 1950/07/08, 64 y.o.   MRN: [id number]  HPI  patient is here today for annual physical. Patient feels well overall. Also reviewed chronic medical conditions, interval events and current concerns  Past Medical History  Diagnosis Date  . ALLERGIC RHINITIS   . ARTHRITIS   . ULCERATIVE COLITIS   . Dyslipidemia   . Obese    Family History  Problem Relation Age of Onset  . Hypertension Mother   . Hypertension Father   . Hypertension Sister   . Diabetes Sister   . Heart disease Brother   . Hypertension Maternal Grandmother   . Hypertension Paternal Grandmother   . Hypertension Paternal Grandfather   . Heart disease Father   . Heart attack Sister   . Breast cancer Sister 53   Social History  Substance Use Topics  . Smoking status: Never Smoker   . 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 Alcohol Use: No     Comment: occasionally    Review of Systems  Constitutional: Negative for fever, fatigue and unexpected weight change.  Respiratory: Negative for cough (recent cold, but not otherwise), shortness of breath and wheezing.   Cardiovascular: Positive for chest pain (continued intermittent L side anterior CP, esp when stressed or lying suping - neg ETT 12/2014 reviewed). Negative for palpitations and leg swelling.  Gastrointestinal: Negative for nausea, abdominal pain and diarrhea.       No reflux sx since taking Zantac   Neurological: Negative for dizziness, weakness, light-headedness and headaches.  Psychiatric/Behavioral: Negative for dysphoric mood. The patient is not nervous/anxious.   All other systems reviewed and are negative.      Objective:    Physical Exam  Constitutional: She is oriented to person, place, and time. She appears well-developed and well-nourished. No distress.  HENT:  Head: Normocephalic and atraumatic.  Right Ear: External ear normal.    Left Ear: External ear normal.  Nose: Nose normal.  Mouth/Throat: Oropharynx is clear and moist. No oropharyngeal exudate.  Eyes: EOM are normal. Pupils are equal, round, and reactive to light. Right eye exhibits no discharge. Left eye exhibits no discharge. No scleral icterus.  Neck: Normal range of motion. Neck supple. No JVD present. No tracheal deviation present. No thyromegaly present.  Cardiovascular: Normal rate, regular rhythm, normal heart sounds and intact distal pulses.  Exam reveals no friction rub.   No murmur heard. Pulmonary/Chest: Effort normal and breath sounds normal. No respiratory distress. She has no wheezes. She has no rales. She exhibits no tenderness.  Abdominal: Soft. Bowel sounds are normal. She exhibits no distension and no mass. There is no tenderness. There is no rebound and no guarding.  Genitourinary:  Defer to gyn  Musculoskeletal: Normal range of motion.  No gross deformities  Lymphadenopathy:    She has no cervical adenopathy.  Neurological: She is alert and oriented to person, place, and time. She has normal reflexes. No cranial nerve deficit.  Skin: Skin is warm and dry. No rash noted. She is not diaphoretic. No erythema.  Psychiatric: She has a normal mood and affect. Her behavior is normal. Judgment and thought content normal.  Nursing note and vitals reviewed.   BP 110/80 mmHg  Pulse 69  Temp(Src) 97.6 F (36.4 C) (Oral)  Ht 5' 5"  (1.651 m)  Wt 176 lb (79.833 kg)  BMI 29.29 kg/m2  SpO2 97% Wt Readings from Last 3 Encounters:  02/02/15 176 lb (79.833 kg)  11/07/14 183 lb (83.008 kg)  04/08/13 196 lb 14.4 oz (89.313 kg)    Lab Results  Component Value Date   WBC 7.3 10/09/2013   HGB 14.5 10/09/2013   HCT 42.6 10/09/2013   PLT 270.0 10/09/2013   GLUCOSE 113* 10/09/2013   CHOL 189 11/07/2014   TRIG 159* 11/07/2014   HDL 62 11/07/2014   LDLCALC 95 11/07/2014   ALT 28 10/09/2013   AST 24 10/09/2013   NA 143 10/09/2013   K 4.2  10/09/2013   CL 110 10/09/2013   CREATININE 0.7 10/09/2013   BUN 18 10/09/2013   CO2 28 10/09/2013   TSH 2.83 10/09/2013    No results found.     Assessment & Plan:   CPX/z00.00 - Patient has been counseled on age-appropriate routine health concerns for screening and prevention. These are reviewed and up-to-date. Immunizations are up-to-date or declined. Labs ordered and reviewed.  Left-sided anterior chest pain. Persistent intermittent for several months. Patient feels related to stress with employment. We'll check chest x-ray. Reviewed negative exercise tolerance test October 2016. Given family history of CAD and personal risk factors, refer to cardiology for further evaluation of personal risk reduction  Problem List Items Addressed This Visit    Adjustment disorder with mixed anxiety and depressed mood    On Prozac for years Reports mild increase in symptoms with stress at work manifest as physical and emotional fatigue, ?related to L chest pain sx denies SI/HI Discussed potential options for medical therapy and patient feels ready for change trial Wellbutrin XR 169m qd not effective summer 2015 so resumed Prozac as before Discussed possible higher dose but will first try change in work scheduled: specifically avoid 3 consecutive working days - medical letter provided to pt stating same Pt will keep uKoreaposted on her symptoms        Other Visit Diagnoses    Routine general medical examination at a health care facility    -  Primary    Relevant Orders    Basic metabolic panel    CBC with Differential/Platelet    Hepatic function panel    Lipid panel    TSH    Urinal[facility], [facility]    Chest tightness        Relevant Medications    ranitidine (ZANTAC) 150 MG tablet    Other Relevant Orders    DG Chest 2 View    Ambulatory referral to Cardiology    Left sided chest pain        Relevant Orders    DG Chest 2 View    Ambulatory referral to  Cardiology    Need for hepatitis C screening test        Relevant Orders    Hepatitis C antibody        VGwendolyn Grant MD

## 2015-02-02 NOTE — Progress Notes (Signed)
Pre visit review using our clinic review tool, if applicable. No additional management support is needed unless otherwise documented below in the visit note. 

## 2015-02-02 NOTE — Assessment & Plan Note (Signed)
On Prozac for years Reports mild increase in symptoms with stress at work manifest as physical and emotional fatigue, ?related to L chest pain sx denies SI/HI Discussed potential options for medical therapy and patient feels ready for change trial Wellbutrin XR 171m qd not effective summer 2015 so resumed Prozac as before Discussed possible higher dose but will first try change in work scheduled: specifically avoid 3 consecutive working days - medical letter provided to pt stating same Pt will keep uKoreaposted on her symptoms

## 2015-02-02 NOTE — Patient Instructions (Signed)
It was good to see you today.  We have reviewed your prior records including labs and tests today  Health Maintenance reviewed - all recommended immunizations and age-appropriate screenings are up-to-date.  Test(s) ordered today -labs and chest x-ray. Your results will be released to [place] (or called to you) after review, usually within 72hours after test completion. If any changes need to be made, you will be notified at that same time.  Medications reviewed and updated, no changes recommended at this time.  We'll make referral to cardiology for review of your personal risk factor for heart disease. My office will call with this appointment with Dr. Aundra Dubin as discussed.  Letter for employer given to you as requested to not work 3 consecutive days for physical and mental health reasons  Please schedule followup in 12 months for annual exam and labs with either Dr. Sharlet Salina or Dr. Quay Burow, call sooner if problems.

## 2015-02-02 NOTE — Addendum Note (Signed)
Addended by: Gwendolyn Grant A on: 02/02/2015 11:43 AM   Modules accepted: Miquel Dunn

## 2015-02-03 LAB — CBC WITH DIFFERENTIAL/PLATELET
BASOS ABS: 0 10*3/uL (ref 0.0–0.1)
Basophils Relative: 0.1 % (ref 0.0–3.0)
EOS ABS: 0.3 10*3/uL (ref 0.0–0.7)
Eosinophils Relative: 3.4 % (ref 0.0–5.0)
HEMATOCRIT: 43.4 % (ref 36.0–46.0)
Hemoglobin: 14.7 g/dL (ref 12.0–15.0)
LYMPHS PCT: 21 % (ref 12.0–46.0)
Lymphs Abs: 1.6 10*3/uL (ref 0.7–4.0)
MCHC: 33.8 g/dL (ref 30.0–36.0)
MCV: 89.4 fl (ref 78.0–100.0)
Monocytes Absolute: 0.2 10*3/uL (ref 0.1–1.0)
Monocytes Relative: 3 % (ref 3.0–12.0)
NEUTROS ABS: 5.4 10*3/uL (ref 1.4–7.7)
Neutrophils Relative %: 72.5 % (ref 43.0–77.0)
PLATELETS: 248 10*3/uL (ref 150.0–400.0)
RBC: 4.85 Mil/uL (ref 3.87–5.11)
RDW: 13.8 % (ref 11.5–15.5)
WBC: 7.5 10*3/uL (ref 4.0–10.5)

## 2015-02-26 ENCOUNTER — Ambulatory Visit: Payer: 59

## 2015-03-06 ENCOUNTER — Ambulatory Visit
Admission: RE | Admit: 2015-03-06 | Discharge: 2015-03-06 | Disposition: A | Discharge status: Home / Self Care | Payer: 59 | Source: Ambulatory Visit

## 2015-03-06 DIAGNOSIS — Z1231 Encounter for screening mammogram for malignant neoplasm of breast: Secondary | ICD-10-CM

## 2015-03-23 ENCOUNTER — Ambulatory Visit: Payer: 59 | Admitting: Cardiology

## 2015-05-25 ENCOUNTER — Other Ambulatory Visit: Payer: Self-pay | Admitting: Internal Medicine

## 2015-05-25 MED FILL — raNITIdine HCL 150 MG TABS: 150 | 90 days supply | Qty: 180 | Fill #0

## 2015-05-25 MED FILL — MELOXICAM 7.5 MG TABLET: 7.5 | 90 days supply | Qty: 90 | Fill #0

## 2015-06-04 ENCOUNTER — Encounter: Payer: Self-pay | Admitting: Cardiology

## 2015-06-10 ENCOUNTER — Ambulatory Visit: Payer: 59 | Admitting: Cardiology

## 2015-07-15 ENCOUNTER — Ambulatory Visit (INDEPENDENT_AMBULATORY_CARE_PROVIDER_SITE_OTHER): Payer: 59 | Admitting: Cardiology

## 2015-07-15 ENCOUNTER — Encounter: Payer: Self-pay | Admitting: Cardiology

## 2015-07-15 VITALS — BP 122/74 | HR 65 | Ht 65.0 in | Wt 175.2 lb

## 2015-07-15 DIAGNOSIS — R079 Chest pain, unspecified: Secondary | ICD-10-CM

## 2015-07-15 DIAGNOSIS — Z8249 Family history of ischemic heart disease and other diseases of the circulatory system: Secondary | ICD-10-CM | POA: Diagnosis not present

## 2015-07-15 DIAGNOSIS — E785 Hyperlipidemia, unspecified: Secondary | ICD-10-CM

## 2015-07-15 HISTORY — DX: Chest pain, unspecified: R07.9

## 2015-07-15 HISTORY — DX: Family history of ischemic heart disease and other diseases of the circulatory system: Z82.49

## 2015-07-15 NOTE — Progress Notes (Signed)
Cardiology Office Note    Date:  07/15/2015   ID:  BERDINA CHEEVER, DOB [birth year], MRN [id number]  PCP:  Binnie Rail, MD  Cardiologist:  Sueanne Margarita, MD   Chief Complaint  Patient presents with  . Chest Pain    History of Present Illness:  Victoria Stone is a 65 y.o. female with a history of dyslipidemia and ulcerative colitis who presents today for evaluation of chest pain.  She states that she started having chest discomfort back in the fall.  It typically occurred at night while she was at work.  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  She saw her PCP who felt she may have some increased stress with work that was manifesting with physical complaints.  She changed her work schedule so she was not working 3 nights in a row which help and eventually scaled back to 1 night weekly.  Since then, her chest pain has essentially resolved.  She has a very strong family history of CAD.  Her brother died of a cardiac arrest in his late 31's and autopsy showed severe CAD.  Her sister has had PCIs and CABG at age 42.  Neither of them smoked.  She denies any history of tobacco use.  She denies any SOB, DOE, LE edema, dizziness, palpitations or syncope.  She has no diaphoresis or nausea with the CP which has now essentially resolved.  She is now referred for further evaluation.  She had an ETT in October 2016 which showed no ischemia. She had a coronary CT calcium score done in 2010 which was 0.      Past Medical History  Diagnosis Date  . ALLERGIC RHINITIS   . ARTHRITIS   . ULCERATIVE COLITIS   . Dyslipidemia   . Obese   . Family history of premature coronary artery disease 07/15/2015    Past Surgical History  Procedure Laterality Date  . Abdominal hysterectomy  2001  . Foot surgery  90, 04, 09    X'S 3  . Hammer toe surgery    . Bone spur    . Tubal ligation  1988  . Breast surgery  1988    Reduction  . Laparoscopic gastric banding  2005     Current Medications: Outpatient Prescriptions Prior to Visit  Medication Sig Dispense Refill  . atorvastatin (LIPITOR) 20 MG tablet Take 1 tablet (20 mg total) by mouth daily. 90 tablet 3  . Calcium Carbonate (CALCIUM 600 PO) Take by mouth daily.    Marland Kitchen FLUoxetine (PROZAC) 20 MG capsule Take 1 capsule (20 mg total) by mouth daily. 90 capsule 3  . meloxicam (MOBIC) 7.5 MG tablet Take 1 tablet (7.5 mg total) by mouth daily. 90 tablet 2  . NON FORMULARY Women 55 plus multivitamin take 1 everyday     . NON FORMULARY Women Bayer Aspirin plus calcium take 1 everyday    . Omega-3 Fatty Acids (FISH OIL) 1200 MG CAPS Take by mouth 2 (two) times daily.      . ranitidine (ZANTAC) 150 MG tablet Take 1 tablet (150 mg total) by mouth daily. 90 tablet 3  . ranitidine (ZANTAC) 150 MG tablet TAKE 1 TABLET BY MOUTH 2 TIMES DAILY. (Patient not taking: Reported on 07/15/2015) 60 tablet 5  . triamcinolone ointment (KENALOG) 0.5 % Apply topically 2 (two) times daily as needed. (Patient not taking: Reported on 07/15/2015) 30 g 0  No facility-administered medications prior to visit.     Allergies:   Penicillins   Social History   Social History  . Marital Status: Married    Spouse Name: N/A  . Number of Children: 2  . Years of Education: N/A   Occupational History  . RN [employer]   Social History Main Topics  . Smoking status: Never Smoker   . 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 Alcohol Use: No     Comment: occasionally  . Drug Use: No  . Sexual Activity: Not Asked   Other Topics Concern  . None   Social History Narrative   RN at [employer]   Married, lives with spouse     Family History:  The patient's family history includes Breast cancer in her sister; Diabetes in her sister; Heart attack in her father and sister; Heart disease in her brother and father; Hypertension in her father, maternal grandmother, mother, paternal  grandfather, paternal grandmother, and sister; Lymphoma in her mother; Other in her mother.   ROS:   Please see the history of present illness.    ROS All other systems reviewed and are negative.   PHYSICAL EXAM:   VS:  BP 122/74 mmHg  Pulse 65  Ht 5' 5"  (1.651 m)  Wt 175 lb 3.2 oz (79.47 kg)  BMI 29.15 kg/m2   GEN: Well nourished, well developed, in no acute distress HEENT: normal Neck: no JVD, carotid bruits, or masses Cardiac: RRR; no murmurs, rubs, or gallops,no edema.  Intact distal pulses bilaterally.  Respiratory:  clear to auscultation bilaterally, normal work of breathing GI: soft, nontender, nondistended, + BS MS: no deformity or atrophy Skin: warm and dry, no rash Neuro:  Alert and Oriented x 3, Strength and sensation are intact Psych: euthymic mood, full affect  Wt Readings from Last 3 Encounters:  07/15/15 175 lb 3.2 oz (79.47 kg)  02/02/15 176 lb (79.833 kg)  11/07/14 183 lb (83.008 kg)      Studies/Labs Reviewed:   EKG:  EKG is  ordered today.  The ekg ordered today demonstrates NSR at 65bpm with no ST changes  Recent Labs: 02/02/2015: ALT 34; BUN 22; Creatinine, Ser 0.76; Hemoglobin 14.7; Platelets 248.0; Potassium 4.6; Sodium 142; TSH 2.54   Lipid Panel    Component Value Date/Time   CHOL 134 02/02/2015 1149   CHOL 189 11/07/2014 0933   TRIG 93.0 02/02/2015 1149   TRIG 159* 11/07/2014 0933   HDL 48.60 02/02/2015 1149   HDL 62 11/07/2014 0933   CHOLHDL 3 02/02/2015 1149   VLDL 18.6 02/02/2015 1149   LDLCALC 67 02/02/2015 1149   [place] 95 11/07/2014 0933    Additional studies/ records that were reviewed today include:  Office visits from PCP    ASSESSMENT:    1. Chest pain, unspecified chest pain type   2. Family history of premature coronary artery disease   3. Dyslipidemia      PLAN:  In order of problems listed above:  1. Chest pain which has essentially resolved after cutting back on work schedule.  This mainly occurred at night  with work.  ETT last fall during the episodes showed no ischemia.  She also has a history of normal coronary calcium score by CT in 2010.  Since her CP has resolved with changing her work schedule this was most likely related to stress.  She does have a strong family  history though of premature CAD and therefore, I have recommended that she followup yearly to monitor.  She will let me know if she has any further CP.  Continue statin therapy, 2. Family history of premature CAD - yearly followup with Cardiology 3. Dyslipidemia - continue statin and followed by PCP    Medication Adjustments/Labs and Tests Ordered: Current medicines are reviewed at length with the patient today.  Concerns regarding medicines are outlined above.  Medication changes, Labs and Tests ordered today are listed in the Patient Instructions below.   Lurena Nida, MD  07/15/2015 10:56 PM    [facility] [place], [place], [place]  [address] Phone: [phone]; Fax: [phone]

## 2015-07-15 NOTE — Patient Instructions (Signed)

## 2015-07-29 MED FILL — FLUoxetine HCL 20 MG CAPS: 20 | 90 days supply | Qty: 90 | Fill #0

## 2015-07-29 MED FILL — ATORVASTATIN 20 MG TABLET: 20 | 90 days supply | Qty: 90 | Fill #1

## 2015-08-27 DIAGNOSIS — Z4651 Encounter for fitting and adjustment of gastric lap band: Secondary | ICD-10-CM | POA: Diagnosis not present

## 2015-09-21 MED FILL — MELOXICAM 7.5 MG TABLET: 7.5 | 90 days supply | Qty: 90 | Fill #1

## 2015-09-24 ENCOUNTER — Encounter: Payer: Self-pay | Admitting: Dietician

## 2015-09-24 ENCOUNTER — Encounter: Payer: 59 | Attending: Internal Medicine | Admitting: Dietician

## 2015-09-24 DIAGNOSIS — Z713 Dietary counseling and surveillance: Secondary | ICD-10-CM | POA: Insufficient documentation

## 2015-09-24 DIAGNOSIS — E663 Overweight: Secondary | ICD-10-CM

## 2015-09-24 NOTE — Progress Notes (Signed)
  Medical Nutrition Therapy:  Appt start time: 0805 end time:  0835.   Assessment:  Primary concerns today: Victoria Stone is here today since she would like eat better and lose weight. Feels like she knows what to do but hasn't been doing that lately. Had LAGB surgery 10 years ago and lost about 60 lbs initial and regained 25-30 lbs over the past 5 years. Occasionally feels restriction in her band. Chicken does not sit well even though eats it frequently. Does not eat bread since it doesn't sit well. Follow up with [facility] about 1 year ago and they gave her 1/4 cc fill and checked the placement. Did not choose to get weighed today. Would like to lose 20 lbs (states current weight is 172 lbs.) Recently has tried to cut down on portions and cut out night snacks.  Sometimes feel hunger but not always. Feels like portion sizes are too big. Sometimes feels like she needs more of a fill but not always.   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 Lives with her husband and he does most of the meal preparation at home. Husband is also overweight and trying to eat better. Often does not eat lunch (eats 2 meals per day). Does not have regular foods that she eats. Eats out about 1-2 meals per week.   Taking MVI and calcium. Feels like she chews well. Waits about 30 minutes to drink after eating. Sometimes does eat sweets or fried foods.   Preferred Learning Style:   No preference indicated   Learning Readiness:   Ready  MEDICATIONS: see list   DIETARY INTAKE:  Usual eating pattern includes 2 meals and sometimes has snacks per day.  Avoided foods include: bread  24-hr recall:  B (930 AM): thrive meal supplement  Snk ( AM): can't say L (130PM): chicken or cheese sticks Snk ( PM): can't say D ( PM): can't say Snk ( PM): can't say Beverages: Water, crystal light, coffee if working (about 64 oz)  Usual physical activity: walks 4 x week for 30 minutes and swims for 20 minutes  Progress Towards Goal(s):   In progress.   Nutritional Diagnosis:  [place]-3.3 Overweight/obesity As related to hx of large portion sizes and unstructured meals.  As evidenced by patient report.    Intervention:  Nutrition counseling provided. Mariska wanted a plan to follow to get back on track similar to what she followed after LAGB surgery. We discussed using the Pre Op Diet as a template and doing just one protein shake per day and 3 meals instead of 2 and [place] thought that this plan might help. She will follow up in 1 month and we can further modify this plan to add back in some carbohydrates.  Teaching Method Utilized:  Visual Auditory Hands on  Handouts given during visit include:  Pre Op Diet  Barriers to learning/adherence to lifestyle change: unstructured meals  Demonstrated degree of understanding via:  Teach Back   Monitoring/Evaluation:  Dietary intake, exercise, and body weight in 1 month(s).

## 2015-09-24 NOTE — Patient Instructions (Signed)
Try having 1 protein shake per day. Have 3 meals with 3 oz of lean protein (size of deck of cards) and non starchy vegetables.

## 2015-11-10 ENCOUNTER — Ambulatory Visit: Payer: 59 | Admitting: Dietician

## 2015-11-23 MED FILL — FLUoxetine HCL 20 MG CAPS: 20 | 90 days supply | Qty: 90 | Fill #1

## 2015-12-03 ENCOUNTER — Encounter: Payer: Self-pay | Admitting: Student

## 2015-12-04 ENCOUNTER — Telehealth: Payer: Self-pay | Admitting: Emergency Medicine

## 2015-12-04 NOTE — Telephone Encounter (Signed)
[place]  - rx written

## 2015-12-04 NOTE — Telephone Encounter (Signed)
LVM for pt to call back.

## 2015-12-04 NOTE — Telephone Encounter (Signed)
Pt is requesting that we write her goal weight on an RX pad for her to take to the weight watchers programs she is doing. Pt has not seen Dr Quay Burow yet but has an appt in [facility]. Please advise, pt will come and pick up when completed. Her current goal weight is 162 lbs

## 2015-12-04 NOTE — Telephone Encounter (Signed)
Pt asked for you to give her a call back. She has a crazy request. She wouldn't give me details. Thanks.

## 2015-12-04 NOTE — Telephone Encounter (Signed)
LVM informing pt rx was ready for pick up at front office.

## 2016-01-11 MED FILL — ATORVASTATIN 20 MG TABLET: 20 | 90 days supply | Qty: 90 | Fill #2

## 2016-01-11 MED FILL — MELOXICAM 7.5 MG TABLET: 7.5 | 90 days supply | Qty: 90 | Fill #2

## 2016-01-11 MED FILL — raNITIdine HCL 150 MG TABS: 150 | 90 days supply | Qty: 180 | Fill #1

## 2016-02-02 ENCOUNTER — Other Ambulatory Visit: Payer: Self-pay | Admitting: Obstetrics and Gynecology

## 2016-02-02 DIAGNOSIS — Z9889 Other specified postprocedural states: Secondary | ICD-10-CM

## 2016-02-02 DIAGNOSIS — Z1231 Encounter for screening mammogram for malignant neoplasm of breast: Secondary | ICD-10-CM

## 2016-02-08 ENCOUNTER — Encounter: Payer: Self-pay | Admitting: Internal Medicine

## 2016-02-08 ENCOUNTER — Ambulatory Visit (INDEPENDENT_AMBULATORY_CARE_PROVIDER_SITE_OTHER): Payer: 59 | Admitting: Internal Medicine

## 2016-02-08 ENCOUNTER — Other Ambulatory Visit (INDEPENDENT_AMBULATORY_CARE_PROVIDER_SITE_OTHER): Payer: 59

## 2016-02-08 VITALS — BP 126/82 | HR 65 | Temp 98.0°F | Resp 16 | Ht 65.0 in | Wt 174.0 lb

## 2016-02-08 DIAGNOSIS — K2100 Gastro-esophageal reflux disease with esophagitis, without bleeding: Secondary | ICD-10-CM

## 2016-02-08 DIAGNOSIS — K5180 Other ulcerative colitis without complications: Secondary | ICD-10-CM | POA: Diagnosis not present

## 2016-02-08 DIAGNOSIS — Z0000 Encounter for general adult medical examination without abnormal findings: Secondary | ICD-10-CM | POA: Diagnosis not present

## 2016-02-08 DIAGNOSIS — Z13820 Encounter for screening for osteoporosis: Secondary | ICD-10-CM | POA: Diagnosis not present

## 2016-02-08 DIAGNOSIS — E785 Hyperlipidemia, unspecified: Secondary | ICD-10-CM

## 2016-02-08 DIAGNOSIS — K219 Gastro-esophageal reflux disease without esophagitis: Secondary | ICD-10-CM

## 2016-02-08 DIAGNOSIS — R0789 Other chest pain: Secondary | ICD-10-CM

## 2016-02-08 DIAGNOSIS — F4323 Adjustment disorder with mixed anxiety and depressed mood: Secondary | ICD-10-CM

## 2016-02-08 DIAGNOSIS — R079 Chest pain, unspecified: Secondary | ICD-10-CM

## 2016-02-08 HISTORY — DX: Gastro-esophageal reflux disease without esophagitis: K21.9

## 2016-02-08 LAB — CBC WITH DIFFERENTIAL/PLATELET
BASOS ABS: 0.1 10*3/uL (ref 0.0–0.1)
Basophils Relative: 1.3 % (ref 0.0–3.0)
EOS ABS: 0.2 10*3/uL (ref 0.0–0.7)
Eosinophils Relative: 2.7 % (ref 0.0–5.0)
HCT: 42.7 % (ref 36.0–46.0)
Hemoglobin: 14.4 g/dL (ref 12.0–15.0)
LYMPHS ABS: 1.4 10*3/uL (ref 0.7–4.0)
Lymphocytes Relative: 20.7 % (ref 12.0–46.0)
MCHC: 33.7 g/dL (ref 30.0–36.0)
MCV: 89.2 fl (ref 78.0–100.0)
MONO ABS: 0.5 10*3/uL (ref 0.1–1.0)
MONOS PCT: 6.6 % (ref 3.0–12.0)
NEUTROS ABS: 4.7 10*3/uL (ref 1.4–7.7)
NEUTROS PCT: 68.7 % (ref 43.0–77.0)
PLATELETS: 275 10*3/uL (ref 150.0–400.0)
RBC: 4.79 Mil/uL (ref 3.87–5.11)
RDW: 14 % (ref 11.5–15.5)
WBC: 6.9 10*3/uL (ref 4.0–10.5)

## 2016-02-08 LAB — LIPID PANEL
CHOLESTEROL: 159 mg/dL (ref 0–200)
HDL: 68.8 mg/dL (ref >39.00)
LDL Cholesterol: 70 mg/dL (ref 0–99)
NONHDL: 90.02
Total CHOL/HDL Ratio: 2
Triglycerides: 102 mg/dL (ref 0.0–149.0)
VLDL: 20.4 mg/dL (ref 0.0–40.0)

## 2016-02-08 LAB — COMPREHENSIVE METABOLIC PANEL
ALT: 35 U/L (ref 0–35)
AST: 26 U/L (ref 0–37)
Albumin: 4 g/dL (ref 3.5–5.2)
Alkaline Phosphatase: 75 U/L (ref 39–117)
BILIRUBIN TOTAL: 0.6 mg/dL (ref 0.2–1.2)
BUN: 23 mg/dL (ref 6–23)
CO2: 29 meq/L (ref 19–32)
CREATININE: 0.84 mg/dL (ref 0.40–1.20)
Calcium: 9.9 mg/dL (ref 8.4–10.5)
Chloride: 106 mEq/L (ref 96–112)
GFR: 72.71 mL/min (ref >60.00)
GLUCOSE: 94 mg/dL (ref 70–99)
Potassium: 4.3 mEq/L (ref 3.5–5.1)
SODIUM: 142 meq/L (ref 135–145)
Total Protein: 6.9 g/dL (ref 6.0–8.3)

## 2016-02-08 LAB — TSH: TSH: 2.14 u[IU]/mL (ref 0.35–4.50)

## 2016-02-08 MED ORDER — RANITIDINE HCL 150 MG PO TABS: 150.0000 mg | ORAL_TABLET | Freq: Two times a day (BID) | ORAL | 3 refills | Status: DC

## 2016-02-08 MED ORDER — ASPIRIN 81 MG PO CHEW: 81.0000 mg | CHEWABLE_TABLET | Freq: Every day | ORAL | Status: DC

## 2016-02-08 MED ORDER — THERA VITAL M PO TABS: 1.0000 | ORAL_TABLET | Freq: Every day | ORAL | Status: AC

## 2016-02-08 NOTE — Assessment & Plan Note (Signed)
Seen on one colonoscopy and not seen on last colonoscopy No symptoms Never been on  medications

## 2016-02-08 NOTE — Progress Notes (Signed)
Pre visit review using our clinic review tool, if applicable. No additional management support is needed unless otherwise documented below in the visit note. 

## 2016-02-08 NOTE — Assessment & Plan Note (Signed)
Check lipids Continue statin

## 2016-02-08 NOTE — Assessment & Plan Note (Signed)
Her symptoms are consistent with GERD Increase zantac to 150 mg BID Work on weight loss Discussed GERD diet and avoiding laying down within 3 hrs of eating Discussed mobic may increase GERD - she will try to decrease - will give samples of Pennsaid to try Discussed that she may need omeprazole if GERD does not improve, but ideally should just be on this short term

## 2016-02-08 NOTE — Assessment & Plan Note (Signed)
Controlled, stable Continue current dose of medication  

## 2016-02-08 NOTE — Progress Notes (Signed)
Subjective:    Patient ID: Victoria Stone, female    DOB: 1950-10-02, 65 y.o.   MRN: [id number]  HPI She is here to establish with a new pcp.  She is here for a physical exam.   She has intermittent, severe at times burning in her chest.  She had this in the past and was start on zantac, which helped minimally. She takes it once daily.  It occurs randomly throughout the day, but occurs daily.  It is not always associated with food.  It starts in the lower chest, and radiates toward the upper left chest.  She occasional drinks alcohol and drinks caffeine three times a week.   She exercises regularly - walking.  She denies chest pain or SOB with activity.  She had an exercise tolerance test one year ago.    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    Medications and allergies reviewed with patient and updated if appropriate.  Patient Active Problem List   Diagnosis Date Noted  . GERD (gastroesophageal reflux disease) 02/08/2016  . Chest pain 07/15/2015  . Family history of premature coronary artery disease 07/15/2015  . Obese   . Dyslipidemia   . Adjustment disorder with mixed anxiety and depressed mood 11/20/2008  . ALLERGIC RHINITIS 11/20/2008  . Ulcerative colitis ([place]) 11/20/2008  . ARTHRITIS 11/20/2008    Current Outpatient Prescriptions on File Prior to Visit  Medication Sig Dispense Refill  . atorvastatin (LIPITOR) 20 MG tablet Take 1 tablet (20 mg total) by mouth daily. 90 tablet 3  . Calcium Carbonate (CALCIUM 600 PO) Take by mouth daily.    Marland Kitchen FLUoxetine (PROZAC) 20 MG capsule Take 1 capsule (20 mg total) by mouth daily. 90 capsule 3  . meloxicam (MOBIC) 7.5 MG tablet Take 1 tablet (7.5 mg total) by mouth daily. 90 tablet 2  . Omega-3 Fatty Acids (FISH OIL) 1200 MG CAPS Take by mouth 2 (two) times daily.      Marland Kitchen triamcinolone ointment (KENALOG) 0.5 % Apply 1 application topically 2 (two) times daily as needed (foot  rash).     No current facility-administered medications on file prior to visit.     Past Medical History:  Diagnosis Date  . ALLERGIC RHINITIS   . ARTHRITIS   . Dyslipidemia   . Family history of premature coronary artery disease 07/15/2015  . Obese   . ULCERATIVE COLITIS     Past Surgical History:  Procedure Laterality Date  . ABDOMINAL HYSTERECTOMY  2001  . BONE SPUR    . BREAST SURGERY  1988   Reduction  . FOOT SURGERY  90, 04, 09   X'S 3  . HAMMER TOE SURGERY    . LAPAROSCOPIC GASTRIC BANDING  2005  . TUBAL LIGATION  1988    Social History   Social History  . Marital status: Married    Spouse name: N/A  . Number of children: 2  . Years of education: N/A   Occupational History  . RN [employer]   Social History Main Topics  . Smoking status: Never Smoker  . 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 Alcohol use No     Comment: occasionally  . Drug use: No  . Sexual activity: Not Asked   Other Topics Concern  . None   Social History Narrative  RN at [employer]   Married, lives with spouse    Family History  Problem Relation Age of Onset  . Hypertension Mother   . Lymphoma Mother   . Other Mother     PACEMAKER  . Heart disease Mother   . Heart attack Mother   . Hypertension Father   . Heart disease Father   . Heart attack Father   . Hypertension Sister   . Diabetes Sister   . Heart attack Sister   . Heart disease Brother   . Breast cancer Sister   . Hypertension Maternal Grandmother   . Hypertension Paternal Grandmother   . Hypertension Paternal Grandfather     Review of Systems  Constitutional: Negative for appetite change, chills, fatigue and fever.  HENT: Negative for hearing loss and trouble swallowing.   Eyes: Negative for visual disturbance.  Respiratory: Positive for cough (with GERD). Negative for shortness of breath and wheezing.   Cardiovascular: Positive for chest  pain (with GERD). Negative for palpitations and leg swelling.  Gastrointestinal: Negative for abdominal pain, blood in stool, constipation, diarrhea and nausea.  Genitourinary: Negative for dysuria and hematuria.  Musculoskeletal: Positive for arthralgias (knees).  Skin: Negative for color change and rash.  Neurological: Negative for dizziness, light-headedness and headaches.  Psychiatric/Behavioral: Negative for dysphoric mood. The patient is not nervous/anxious.        Objective:   Vitals:   02/08/16 1001  BP: 126/82  Pulse: 65  Resp: 16  Temp: 98 F (36.7 C)   Filed Weights   02/08/16 1001  Weight: 174 lb (78.9 kg)   Body mass index is 28.96 kg/m.   Physical Exam Constitutional: She appears well-developed and well-nourished. No distress.  HENT:  Head: Normocephalic and atraumatic.  Right Ear: External ear normal. Normal ear canal and TM Left Ear: External ear normal.  Normal ear canal and TM Mouth/Throat: Oropharynx is clear and moist.  Eyes: Conjunctivae and EOM are normal.  Neck: Neck supple. No tracheal deviation present. No thyromegaly present.  No carotid bruit  Cardiovascular: Normal rate, regular rhythm and normal heart sounds.   No murmur heard.  No edema. Pulmonary/Chest: Effort normal and breath sounds normal. No respiratory distress. She has no wheezes. She has no rales.  Breast: deferred to Gyn Abdominal: Soft. She exhibits no distension. There is no tenderness.  Lymphadenopathy: She has no cervical adenopathy.  Skin: Skin is warm and dry. She is not diaphoretic.  Psychiatric: She has a normal mood and affect. Her behavior is normal.         Assessment & Plan:   Physical exam: Screening blood work   ordered Immunizations  Up to date  Colonoscopy  Up to date  Mammogram  Up to date  Gyn  Up to date  Dexa-- last done years ago - will order Eye exams  Up to date  Exercise - walking for exercise Weight - advised weight loss - after she retires  she feels she will be able to concentrate on this more Skin -no concerns Substance abuse  none  See Problem List for Assessment and Plan of chronic medical problems.   F/u annually, sooner if needed

## 2016-02-08 NOTE — Patient Instructions (Addendum)
Test(s) ordered today. Your results will be released to [place] (or called to you) after review, usually within 72hours after test completion. If any changes need to be made, you will be notified at that same time.  All other Health Maintenance issues reviewed.   All recommended immunizations and age-appropriate screenings are up-to-date or discussed.  No immunizations administered today.   Medications reviewed and updated.  Changes include increasing Zantac to twice daily.    Samples of [place] was given - use on your knees twice a day.   Please followup in one year for a physical    Health Maintenance, Female Introduction Adopting a healthy lifestyle and getting preventive care can go a long way to promote health and wellness. Talk with your health care provider about what schedule of regular examinations is right for you. This is a good chance for you to check in with your provider about disease prevention and staying healthy. In between checkups, there are plenty of things you can do on your own. Experts have done a lot of research about which lifestyle changes and preventive measures are most likely to keep you healthy. Ask your health care provider for more information. Weight and diet Eat a healthy diet  Be sure to include plenty of vegetables, fruits, low-fat dairy products, and lean protein.  Do not eat a lot of foods high in solid fats, added sugars, or salt.  Get regular exercise. This is one of the most important things you can do for your health.  Most adults should exercise for at least 150 minutes each week. The exercise should increase your heart rate and make you sweat (moderate-intensity exercise).  Most adults should also do strengthening exercises at least twice a week. This is in addition to the moderate-intensity exercise. Maintain a healthy weight  Body mass index (BMI) is a measurement that can be used to identify possible weight problems. It estimates body  fat based on height and weight. Your health care provider can help determine your BMI and help you achieve or maintain a healthy weight.  For females 52 years of age and older:  A BMI below 18.5 is considered underweight.  A BMI of 18.5 to 24.9 is normal.  A BMI of 25 to 29.9 is considered overweight.  A BMI of 30 and above is considered obese. Watch levels of cholesterol and blood lipids  You should start having your blood tested for lipids and cholesterol at 63 years of age, then have this test every 5 years.  You may need to have your cholesterol levels checked more often if:  Your lipid or cholesterol levels are high.  You are older than 63 years of age.  You are at high risk for heart disease. Cancer screening Lung Cancer  Lung cancer screening is recommended for adults 3-69 years old who are at high risk for lung cancer because of a history of smoking.  A yearly low-dose CT scan of the lungs is recommended for people who:  Currently smoke.  Have quit within the past 15 years.  Have at least a 30-pack-year history of smoking. A pack year is smoking an average of one pack of cigarettes a day for 1 year.  Yearly screening should continue until it has been 15 years since you quit.  Yearly screening should stop if you develop a health problem that would prevent you from having lung cancer treatment. Breast Cancer  Practice breast self-awareness. This means understanding how your breasts normally appear and  feel.  It also means doing regular breast self-exams. Let your health care provider know about any changes, no matter how small.  If you are in your 20s or 30s, you should have a clinical breast exam (CBE) by a health care provider every 1-3 years as part of a regular health exam.  If you are 68 or older, have a CBE every year. Also consider having a breast X-ray (mammogram) every year.  If you have a family history of breast cancer, talk to your health care  provider about genetic screening.  If you are at high risk for breast cancer, talk to your health care provider about having an MRI and a mammogram every year.  Breast cancer gene (BRCA) assessment is recommended for women who have family members with BRCA-related cancers. BRCA-related cancers include:  Breast.  Ovarian.  Tubal.  Peritoneal cancers.  Results of the assessment will determine the need for genetic counseling and BRCA1 and BRCA2 testing. Cervical Cancer  Your health care provider may recommend that you be screened regularly for cancer of the pelvic organs (ovaries, uterus, and vagina). This screening involves a pelvic examination, including checking for microscopic changes to the surface of your cervix (Pap test). You may be encouraged to have this screening done every 3 years, beginning at age 63.  For women ages 14-65, health care providers may recommend pelvic exams and Pap testing every 3 years, or they may recommend the Pap and pelvic exam, combined with testing for human papilloma virus (HPV), every 5 years. Some types of HPV increase your risk of cervical cancer. Testing for HPV may also be done on women of any age with unclear Pap test results.  Other health care providers may not recommend any screening for nonpregnant women who are considered low risk for pelvic cancer and who do not have symptoms. Ask your health care provider if a screening pelvic exam is right for you.  If you have had past treatment for cervical cancer or a condition that could lead to cancer, you need Pap tests and screening for cancer for at least 20 years after your treatment. If Pap tests have been discontinued, your risk factors (such as having a new sexual partner) need to be reassessed to determine if screening should resume. Some women have medical problems that increase the chance of getting cervical cancer. In these cases, your health care provider may recommend more frequent screening and  Pap tests. Colorectal Cancer  This type of cancer can be detected and often prevented.  Routine colorectal cancer screening usually begins at 63 years of age and continues through 63 years of age.  Your health care provider may recommend screening at an earlier age if you have risk factors for colon cancer.  Your health care provider may also recommend using home test kits to check for hidden blood in the stool.  A small camera at the end of a tube can be used to examine your colon directly (sigmoidoscopy or colonoscopy). This is done to check for the earliest forms of colorectal cancer.  Routine screening usually begins at age 45.  Direct examination of the colon should be repeated every 5-10 years through 63 years of age. However, you may need to be screened more often if early forms of precancerous polyps or small growths are found. Skin Cancer  Check your skin from head to toe regularly.  Tell your health care provider about any new moles or changes in moles, especially if there is a  change in a mole's shape or color.  Also tell your health care provider if you have a mole that is larger than the size of a pencil eraser.  Always use sunscreen. Apply sunscreen liberally and repeatedly throughout the day.  Protect yourself by wearing long sleeves, pants, a wide-brimmed hat, and sunglasses whenever you are outside. Heart disease, diabetes, and high blood pressure  High blood pressure causes heart disease and increases the risk of stroke. High blood pressure is more likely to develop in:  People who have blood pressure in the high end of the normal range (130-139/85-89 mm Hg).  People who are overweight or obese.  People who are African American.  If you are 78-56 years of age, have your blood pressure checked every 3-5 years. If you are 28 years of age or older, have your blood pressure checked every year. You should have your blood pressure measured twice-once when you are at a  hospital or clinic, and once when you are not at a hospital or clinic. Record the average of the two measurements. To check your blood pressure when you are not at a hospital or clinic, you can use:  An automated blood pressure machine at a pharmacy.  A home blood pressure monitor.  If you are between 44 years and 43 years old, ask your health care provider if you should take aspirin to prevent strokes.  Have regular diabetes screenings. This involves taking a blood sample to check your fasting blood sugar level.  If you are at a normal weight and have a low risk for diabetes, have this test once every three years after 63 years of age.  If you are overweight and have a high risk for diabetes, consider being tested at a younger age or more often. Preventing infection Hepatitis B  If you have a higher risk for hepatitis B, you should be screened for this virus. You are considered at high risk for hepatitis B if:  You were born in a country where hepatitis B is common. Ask your health care provider which countries are considered high risk.  Your parents were born in a high-risk country, and you have not been immunized against hepatitis B (hepatitis B vaccine).  You have HIV or AIDS.  You use needles to inject street drugs.  You live with someone who has hepatitis B.  You have had sex with someone who has hepatitis B.  You get hemodialysis treatment.  You take certain medicines for conditions, including cancer, organ transplantation, and autoimmune conditions. Hepatitis C  Blood testing is recommended for:  Everyone born from [birth year] through 1963.  Anyone with known risk factors for hepatitis C. Sexually transmitted infections (STIs)  You should be screened for sexually transmitted infections (STIs) including gonorrhea and chlamydia if:  You are sexually active and are younger than 63 years of age.  You are older than 63 years of age and your health care provider tells you  that you are at risk for this type of infection.  Your sexual activity has changed since you were last screened and you are at an increased risk for chlamydia or gonorrhea. Ask your health care provider if you are at risk.  If you do not have HIV, but are at risk, it may be recommended that you take a prescription medicine daily to prevent HIV infection. This is called pre-exposure prophylaxis (PrEP). You are considered at risk if:  You are sexually active and do not regularly use condoms or  know the HIV status of your partner(s).  You take drugs by injection.  You are sexually active with a partner who has HIV. Talk with your health care provider about whether you are at high risk of being infected with HIV. If you choose to begin PrEP, you should first be tested for HIV. You should then be tested every 3 months for as long as you are taking PrEP. Pregnancy  If you are premenopausal and you may become pregnant, ask your health care provider about preconception counseling.  If you may become pregnant, take 400 to 800 micrograms (mcg) of folic acid every day.  If you want to prevent pregnancy, talk to your health care provider about birth control (contraception). Osteoporosis and menopause  Osteoporosis is a disease in which the bones lose minerals and strength with aging. This can result in serious bone fractures. Your risk for osteoporosis can be identified using a bone density scan.  If you are 29 years of age or older, or if you are at risk for osteoporosis and fractures, ask your health care provider if you should be screened.  Ask your health care provider whether you should take a calcium or vitamin D supplement to lower your risk for osteoporosis.  Menopause may have certain physical symptoms and risks.  Hormone replacement therapy may reduce some of these symptoms and risks. Talk to your health care provider about whether hormone replacement therapy is right for you. Follow  these instructions at home:  Schedule regular health, dental, and eye exams.  Stay current with your immunizations.  Do not use any tobacco products including cigarettes, chewing tobacco, or electronic cigarettes.  If you are pregnant, do not drink alcohol.  If you are breastfeeding, limit how much and how often you drink alcohol.  Limit alcohol intake to no more than 1 drink per day for nonpregnant women. One drink equals 12 ounces of beer, 5 ounces of wine, or 1 ounces of hard liquor.  Do not use street drugs.  Do not share needles.  Ask your health care provider for help if you need support or information about quitting drugs.  Tell your health care provider if you often feel depressed.  Tell your health care provider if you have ever been abused or do not feel safe at home. This information is not intended to replace advice given to you by your health care provider. Make sure you discuss any questions you have with your health care provider. Document Released: 09/13/2010 Document Revised: 08/06/2015 Document Reviewed: 12/02/2014  2017 [employer]vier

## 2016-02-08 NOTE — Assessment & Plan Note (Signed)
Symptoms consistent with GERD - had exercise tolerance test last year,which was normal No further cardiac work up needed at this time

## 2016-02-16 DIAGNOSIS — Z01419 Encounter for gynecological examination (general) (routine) without abnormal findings: Secondary | ICD-10-CM | POA: Diagnosis not present

## 2016-02-16 DIAGNOSIS — Z6829 Body mass index (BMI) 29.0-29.9, adult: Secondary | ICD-10-CM | POA: Diagnosis not present

## 2016-02-23 ENCOUNTER — Other Ambulatory Visit: Payer: Self-pay | Admitting: *Deleted

## 2016-02-23 MED ORDER — FLUOXETINE HCL 20 MG PO CAPS
20.0000 mg | ORAL_CAPSULE | Freq: Every day | ORAL | 3 refills | Status: DC
Start: 2016-02-23 — End: 2016-05-09

## 2016-02-23 MED FILL — FLUoxetine HCL 20 MG CAPS: 20 | 90 days supply | Qty: 90 | Fill #0 | Status: TO

## 2016-03-11 ENCOUNTER — Ambulatory Visit (INDEPENDENT_AMBULATORY_CARE_PROVIDER_SITE_OTHER)
Admission: RE | Admit: 2016-03-11 | Discharge: 2016-03-11 | Disposition: A | Discharge status: Home / Self Care | Payer: 59 | Source: Ambulatory Visit | Attending: Internal Medicine | Admitting: Internal Medicine

## 2016-03-11 ENCOUNTER — Ambulatory Visit
Admission: RE | Admit: 2016-03-11 | Discharge: 2016-03-11 | Disposition: A | Discharge status: Home / Self Care | Payer: 59 | Source: Ambulatory Visit | Attending: Obstetrics and Gynecology | Admitting: Obstetrics and Gynecology

## 2016-03-11 DIAGNOSIS — Z9889 Other specified postprocedural states: Secondary | ICD-10-CM

## 2016-03-11 DIAGNOSIS — Z13820 Encounter for screening for osteoporosis: Secondary | ICD-10-CM

## 2016-03-11 DIAGNOSIS — H5213 Myopia, bilateral: Secondary | ICD-10-CM | POA: Diagnosis not present

## 2016-03-11 DIAGNOSIS — Z1231 Encounter for screening mammogram for malignant neoplasm of breast: Secondary | ICD-10-CM

## 2016-03-13 ENCOUNTER — Encounter: Payer: Self-pay | Admitting: Internal Medicine

## 2016-05-09 ENCOUNTER — Telehealth: Payer: Self-pay | Admitting: Internal Medicine

## 2016-05-09 ENCOUNTER — Other Ambulatory Visit: Payer: Self-pay | Admitting: Internal Medicine

## 2016-05-09 DIAGNOSIS — R0789 Other chest pain: Secondary | ICD-10-CM

## 2016-05-09 MED ORDER — ATORVASTATIN CALCIUM 20 MG PO TABS: 20.0000 mg | ORAL_TABLET | Freq: Every day | ORAL | 0 refills | Status: DC

## 2016-05-09 MED ORDER — MELOXICAM 7.5 MG PO TABS: 7.5000 mg | ORAL_TABLET | Freq: Every day | ORAL | 0 refills | Status: DC

## 2016-05-09 MED ORDER — RANITIDINE HCL 150 MG PO TABS: 150.0000 mg | ORAL_TABLET | Freq: Two times a day (BID) | ORAL | 0 refills | Status: DC

## 2016-05-09 MED ORDER — FLUOXETINE HCL 20 MG PO CAPS: 20.0000 mg | ORAL_CAPSULE | Freq: Every day | ORAL | 0 refills | Status: DC

## 2016-05-09 NOTE — Telephone Encounter (Signed)
[facility] in [place] called stated Ms. Hardgrave is visiting her mother who is sick, she is requesting refill for Mobic,Prozac, Lipitor and Zantac.

## 2016-05-09 NOTE — Telephone Encounter (Signed)
Refill have been sent, LVM informing pt.

## 2016-05-26 ENCOUNTER — Other Ambulatory Visit: Payer: Self-pay | Admitting: Emergency Medicine

## 2016-05-26 MED ORDER — ATORVASTATIN CALCIUM 20 MG PO TABS
20.0000 mg | ORAL_TABLET | Freq: Every day | ORAL | 0 refills | Status: DC
Start: 2016-05-26 — End: 2017-04-02

## 2016-05-26 MED FILL — ATORVASTATIN 20 MG TABLET: 20 | 90 days supply | Qty: 90 | Fill #0

## 2016-06-06 MED FILL — raNITIdine HCL 150 MG TABS: 150 | 90 days supply | Qty: 180 | Fill #0 | Status: TO

## 2016-06-06 MED FILL — FLUoxetine HCL 20 MG CAPS: 20 | 90 days supply | Qty: 90 | Fill #1 | Status: TO

## 2016-06-06 MED FILL — MELOXICAM 7.5 MG TABLET: 7.5 | 90 days supply | Qty: 90 | Fill #0 | Status: TO

## 2016-07-20 ENCOUNTER — Ambulatory Visit (INDEPENDENT_AMBULATORY_CARE_PROVIDER_SITE_OTHER): Payer: 59 | Admitting: Cardiology

## 2016-07-20 ENCOUNTER — Encounter: Payer: Self-pay | Admitting: Cardiology

## 2016-07-20 VITALS — BP 116/76 | HR 73 | Ht 65.0 in | Wt 161.0 lb

## 2016-07-20 DIAGNOSIS — Z8249 Family history of ischemic heart disease and other diseases of the circulatory system: Secondary | ICD-10-CM

## 2016-07-20 DIAGNOSIS — E785 Hyperlipidemia, unspecified: Secondary | ICD-10-CM

## 2016-07-20 NOTE — Progress Notes (Signed)
Cardiology Office Note    Date:  07/20/2016   ID:  Victoria Stone, DOB [birth year], MRN [id number]  PCP:  Binnie Rail, MD  Cardiologist:  Fransico Him, MD   Chief Complaint  Patient presents with  . Follow-up  . Hyperlipidemia    History of Present Illness:  Victoria Stone is a 66 y.o. female with a history of dyslipidemia and ulcerative colitis who presents today for followup of chest pain.  When she initially saw me she was having problems with CP for several month which was felt to possibly be due to increased stress with work that was manifesting with physical complaints.  She changed her work schedule so she was not working 3 nights in a row which help and eventually scaled back to 1 night weekly and has since retired.  Since then, her chest pain has essentially resolved.  She has a very strong family history of CAD.  Her brother died of a cardiac arrest in his late 29's and autopsy showed severe CAD.  Her sister has had PCIs and CABG at age 71.  Neither of them smoked.    She is now back for followup due to cardiac risk factors including very strong family history of CAD at an early age. She denies any history of tobacco use.  She denies any further chest pain.  She has not had any SOB, DOE, LE edema, dizziness, palpitations or syncope.  She has had some GERD problems this year which resolved with Zantac.  She had a coronary CT calcium score done in 2010 which was 0 and ETT in 2016 was normal.  She has lost 15lbs in the past year from joining weight watchers and going to the gym.  She has no claudication symptoms.     Past Medical History:  Diagnosis Date  . ALLERGIC RHINITIS   . ARTHRITIS   . Dyslipidemia   . Family history of premature coronary artery disease 07/15/2015  . Obese   . ULCERATIVE COLITIS     Past Surgical History:  Procedure Laterality Date  . ABDOMINAL HYSTERECTOMY  2001  . BONE SPUR    . BREAST SURGERY  1988   Reduction  . FOOT SURGERY  90, 04, 09   X'S 3   . HAMMER TOE SURGERY    . LAPAROSCOPIC GASTRIC BANDING  2005  . TUBAL LIGATION  1988    Current Medications: Current Meds  Medication Sig  . aspirin (ASPIRIN CHILDRENS) 81 MG chewable tablet Chew 1 tablet (81 mg total) by mouth daily.  Marland Kitchen atorvastatin (LIPITOR) 20 MG tablet Take 1 tablet (20 mg total) by mouth daily.  . Calcium Carbonate (CALCIUM 600 PO) Take by mouth daily.  Marland Kitchen FLUoxetine (PROZAC) 20 MG capsule Take 1 capsule (20 mg total) by mouth daily.  . meloxicam (MOBIC) 7.5 MG tablet Take 1 tablet (7.5 mg total) by mouth daily.  . Multiple Vitamins-Minerals (MULTIVITAMIN) tablet Take 1 tablet by mouth daily.  . Omega-3 Fatty Acids (FISH OIL) 1200 MG CAPS Take by mouth 2 (two) times daily.    . ranitidine (ZANTAC) 150 MG tablet Take 1 tablet (150 mg total) by mouth 2 (two) times daily.  Marland Kitchen triamcinolone ointment (KENALOG) 0.5 % Apply 1 application topically 2 (two) times daily as needed (foot rash).    Allergies:   Penicillins   Social History   Social History  . Marital status: Married    Spouse name: N/A  . Number of children: 2  .  Years of education: N/A   Occupational History  . RN [employer]   Social History Main Topics  . Smoking status: Never Smoker  . 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 Alcohol use No     Comment: occasionally  . Drug use: No  . Sexual activity: Not Asked   Other Topics Concern  . None   Social History Narrative   RN at [employer]   Married, lives with spouse     Family History:  The patient's family history includes Breast cancer in her sister; Diabetes in her sister; Heart attack in her father, mother, and sister; Heart disease in her brother, father, and mother; Hypertension in her father, maternal grandmother, mother, paternal grandfather, paternal grandmother, and sister; Lymphoma in her mother; Other in her mother.   ROS:   Please see the history of present  illness.    ROS All other systems reviewed and are negative.  No flowsheet data found.     PHYSICAL EXAM:   VS:  BP 116/76   Pulse 73   Ht 5' 5"  (1.651 m)   Wt 161 lb (73 kg)   SpO2 97%   BMI 26.79 kg/m    GEN: Well nourished, well developed, in no acute distress  HEENT: normal  Neck: no JVD, carotid bruits, or masses Cardiac: RRR; no murmurs, rubs, or gallops,no edema.  Intact distal pulses bilaterally.  Respiratory:  clear to auscultation bilaterally, normal work of breathing GI: soft, nontender, nondistended, + BS MS: no deformity or atrophy  Skin: warm and dry, no rash Neuro:  Alert and Oriented x 3, Strength and sensation are intact Psych: euthymic mood, full affect  Wt Readings from Last 3 Encounters:  07/20/16 161 lb (73 kg)  02/08/16 174 lb (78.9 kg)  07/15/15 175 lb 3.2 oz (79.5 kg)      Studies/Labs Reviewed:   EKG:  EKG is not ordered today.   Recent Labs: 02/08/2016: ALT 35; BUN 23; Creatinine, Ser 0.84; Hemoglobin 14.4; Platelets 275.0; Potassium 4.3; Sodium 142; TSH 2.14   Lipid Panel    Component Value Date/Time   CHOL 159 02/08/2016 1047   CHOL 189 11/07/2014 0933   TRIG 102.0 02/08/2016 1047   TRIG 159 (H) 11/07/2014 0933   HDL 68.80 02/08/2016 1047   HDL 62 11/07/2014 0933   CHOLHDL 2 02/08/2016 1047   VLDL 20.4 02/08/2016 1047   LDLCALC 70 02/08/2016 1047   Vina 95 11/07/2014 0933    Additional studies/ records that were reviewed today include:  none    ASSESSMENT:    1. Dyslipidemia   2. Family history of premature CAD      PLAN:  In order of problems listed above:  1. Dyslipidemia - her LDL goal is < 100 given her CRF including a strong family history of premature CAD, obesity and post menopausal state.  She will continue on Lipitor 50m daily.   2. Family history of premature CAD - she has not had any chest pain.  Her last ETT was 1.5 years ago.  I will repeat an ETT to rule out silent ischemia and repeat coronary  calcium score to assess future risk.     Medication Adjustments/Labs and Tests Ordered: Current medicines are reviewed at length with the patient today.  Concerns regarding medicines are outlined above.  Medication changes, Labs and Tests ordered today are listed in the Patient Instructions below.  There  are no Patient Instructions on file for this visit.   Signed, Fransico Him, MD  07/20/2016 10:32 AM    [facility] [address], [place], [place]  [address] Phone: [phone]; Fax: [phone]

## 2016-07-20 NOTE — Patient Instructions (Signed)
Medication Instructions:  Your physician recommends that you continue on your current medications as directed. Please refer to the Current Medication list given to you today.   Labwork: None  Testing/Procedures: Your physician has requested that you have an exercise tolerance test. For further information please visit [email or web address]. Please also follow instruction sheet, as given.   Dr. Radford Pax recommends you have a CALCIUM SCORE.  Follow-Up: Your physician wants you to follow-up in: 1 year with Dr. Radford Pax. You will receive a reminder letter in the mail two months in advance. If you don't receive a letter, please call our office to schedule the follow-up appointment.   Any Other Special Instructions Will Be Listed Below (If Applicable).     If you need a refill on your cardiac medications before your next appointment, please call your pharmacy.

## 2016-08-02 ENCOUNTER — Ambulatory Visit (INDEPENDENT_AMBULATORY_CARE_PROVIDER_SITE_OTHER): Payer: 59

## 2016-08-02 ENCOUNTER — Ambulatory Visit (INDEPENDENT_AMBULATORY_CARE_PROVIDER_SITE_OTHER)
Admission: RE | Admit: 2016-08-02 | Discharge: 2016-08-02 | Disposition: A | Discharge status: Home / Self Care | Payer: Self-pay | Source: Ambulatory Visit | Attending: Cardiology | Admitting: Cardiology

## 2016-08-02 DIAGNOSIS — E785 Hyperlipidemia, unspecified: Secondary | ICD-10-CM

## 2016-08-02 DIAGNOSIS — Z8249 Family history of ischemic heart disease and other diseases of the circulatory system: Secondary | ICD-10-CM | POA: Diagnosis not present

## 2016-08-02 LAB — EXERCISE TOLERANCE TEST
CSEPEW: 11.7 METS
CSEPHR: 98 %
CSEPPHR: 155 {beats}/min
Exercise duration (min): 10 min
Exercise duration (sec): 0 s
MPHR: 157 {beats}/min
RPE: 16
Rest HR: 67 {beats}/min

## 2016-08-11 ENCOUNTER — Encounter: Payer: Self-pay | Admitting: Internal Medicine

## 2016-09-12 ENCOUNTER — Other Ambulatory Visit: Payer: Self-pay | Admitting: Internal Medicine

## 2016-09-12 MED FILL — ATORVASTATIN 20 MG TABLET: 20 | 90 days supply | Qty: 90 | Fill #0

## 2016-09-12 MED FILL — MELOXICAM 7.5 MG TABLET: 7.5 | 90 days supply | Qty: 90 | Fill #1 | Status: TO

## 2016-09-12 MED FILL — FLUoxetine HCL 20 MG CAPS: 20 | 90 days supply | Qty: 90 | Fill #2 | Status: TO

## 2016-12-20 MED FILL — FLUoxetine HCL 20 MG CAPS: 20 | 90 days supply | Qty: 90 | Fill #3 | Status: TO

## 2017-01-30 MED FILL — ATORVASTATIN 20 MG TABLET: 20 | 90 days supply | Qty: 90 | Fill #1

## 2017-01-30 MED FILL — raNITIdine HCL 150 MG TABS: 150 | 90 days supply | Qty: 180 | Fill #1 | Status: TO

## 2017-01-30 MED FILL — MELOXICAM 7.5 MG TABLET: 7.5 | 90 days supply | Qty: 90 | Fill #2 | Status: TO

## 2017-02-28 ENCOUNTER — Other Ambulatory Visit: Payer: Self-pay | Admitting: Obstetrics and Gynecology

## 2017-02-28 DIAGNOSIS — Z803 Family history of malignant neoplasm of breast: Secondary | ICD-10-CM | POA: Diagnosis not present

## 2017-02-28 DIAGNOSIS — Z01419 Encounter for gynecological examination (general) (routine) without abnormal findings: Secondary | ICD-10-CM | POA: Diagnosis not present

## 2017-02-28 DIAGNOSIS — Z801 Family history of malignant neoplasm of trachea, bronchus and lung: Secondary | ICD-10-CM | POA: Diagnosis not present

## 2017-02-28 DIAGNOSIS — Z6826 Body mass index (BMI) 26.0-26.9, adult: Secondary | ICD-10-CM | POA: Diagnosis not present

## 2017-02-28 DIAGNOSIS — Z1231 Encounter for screening mammogram for malignant neoplasm of breast: Secondary | ICD-10-CM

## 2017-03-08 DIAGNOSIS — Z1231 Encounter for screening mammogram for malignant neoplasm of breast: Secondary | ICD-10-CM | POA: Diagnosis not present

## 2017-03-21 ENCOUNTER — Other Ambulatory Visit: Payer: Self-pay | Admitting: Internal Medicine

## 2017-03-21 MED FILL — FLUoxetine HCL 20 MG CAPS: 20 | 30 days supply | Qty: 30 | Fill #0

## 2017-03-29 ENCOUNTER — Ambulatory Visit: Payer: 59

## 2017-04-02 NOTE — Patient Instructions (Addendum)
Test(s) ordered today. Your results will be released to [place] (or called to you) after review, usually within 72hours after test completion. If any changes need to be made, you will be notified at that same time.  All other Health Maintenance issues reviewed.   All recommended immunizations and age-appropriate screenings are up-to-date or discussed.  No immunizations administered today.   Medications reviewed and updated.  Changes include starting omeprazole for your GERD.   Your prescription(s) have been submitted to your pharmacy. Please take as directed and contact our office if you believe you are having problem(s) with the medication(s).   Please followup in one year   Health Maintenance, Female Adopting a healthy lifestyle and getting preventive care can go a long way to promote health and wellness. Talk with your health care provider about what schedule of regular examinations is right for you. This is a good chance for you to check in with your provider about disease prevention and staying healthy. In between checkups, there are plenty of things you can do on your own. Experts have done a lot of research about which lifestyle changes and preventive measures are most likely to keep you healthy. Ask your health care provider for more information. Weight and diet Eat a healthy diet  Be sure to include plenty of vegetables, fruits, low-fat dairy products, and lean protein.  Do not eat a lot of foods high in solid fats, added sugars, or salt.  Get regular exercise. This is one of the most important things you can do for your health. ? Most adults should exercise for at least 150 minutes each week. The exercise should increase your heart rate and make you sweat (moderate-intensity exercise). ? Most adults should also do strengthening exercises at least twice a week. This is in addition to the moderate-intensity exercise.  Maintain a healthy weight  Body mass index (BMI) is a  measurement that can be used to identify possible weight problems. It estimates body fat based on height and weight. Your health care provider can help determine your BMI and help you achieve or maintain a healthy weight.  For females 56 years of age and older: ? A BMI below 18.5 is considered underweight. ? A BMI of 18.5 to 24.9 is normal. ? A BMI of 25 to 29.9 is considered overweight. ? A BMI of 30 and above is considered obese.  Watch levels of cholesterol and blood lipids  You should start having your blood tested for lipids and cholesterol at 65 years of age, then have this test every 5 years.  You may need to have your cholesterol levels checked more often if: ? Your lipid or cholesterol levels are high. ? You are older than 65 years of age. ? You are at high risk for heart disease.  Cancer screening Lung Cancer  Lung cancer screening is recommended for adults 34-60 years old who are at high risk for lung cancer because of a history of smoking.  A yearly low-dose CT scan of the lungs is recommended for people who: ? Currently smoke. ? Have quit within the past 15 years. ? Have at least a 30-pack-year history of smoking. A pack year is smoking an average of one pack of cigarettes a day for 1 year.  Yearly screening should continue until it has been 15 years since you quit.  Yearly screening should stop if you develop a health problem that would prevent you from having lung cancer treatment.  Breast Cancer  Practice  breast self-awareness. This means understanding how your breasts normally appear and feel.  It also means doing regular breast self-exams. Let your health care provider know about any changes, no matter how small.  If you are in your 20s or 30s, you should have a clinical breast exam (CBE) by a health care provider every 1-3 years as part of a regular health exam.  If you are 49 or older, have a CBE every year. Also consider having a breast X-ray (mammogram)  every year.  If you have a family history of breast cancer, talk to your health care provider about genetic screening.  If you are at high risk for breast cancer, talk to your health care provider about having an MRI and a mammogram every year.  Breast cancer gene (BRCA) assessment is recommended for women who have family members with BRCA-related cancers. BRCA-related cancers include: ? Breast. ? Ovarian. ? Tubal. ? Peritoneal cancers.  Results of the assessment will determine the need for genetic counseling and BRCA1 and BRCA2 testing.  Cervical Cancer Your health care provider may recommend that you be screened regularly for cancer of the pelvic organs (ovaries, uterus, and vagina). This screening involves a pelvic examination, including checking for microscopic changes to the surface of your cervix (Pap test). You may be encouraged to have this screening done every 3 years, beginning at age 67.  For women ages 52-65, health care providers may recommend pelvic exams and Pap testing every 3 years, or they may recommend the Pap and pelvic exam, combined with testing for human papilloma virus (HPV), every 5 years. Some types of HPV increase your risk of cervical cancer. Testing for HPV may also be done on women of any age with unclear Pap test results.  Other health care providers may not recommend any screening for nonpregnant women who are considered low risk for pelvic cancer and who do not have symptoms. Ask your health care provider if a screening pelvic exam is right for you.  If you have had past treatment for cervical cancer or a condition that could lead to cancer, you need Pap tests and screening for cancer for at least 20 years after your treatment. If Pap tests have been discontinued, your risk factors (such as having a new sexual partner) need to be reassessed to determine if screening should resume. Some women have medical problems that increase the chance of getting cervical  cancer. In these cases, your health care provider may recommend more frequent screening and Pap tests.  Colorectal Cancer  This type of cancer can be detected and often prevented.  Routine colorectal cancer screening usually begins at 65 years of age and continues through 65 years of age.  Your health care provider may recommend screening at an earlier age if you have risk factors for colon cancer.  Your health care provider may also recommend using home test kits to check for hidden blood in the stool.  A small camera at the end of a tube can be used to examine your colon directly (sigmoidoscopy or colonoscopy). This is done to check for the earliest forms of colorectal cancer.  Routine screening usually begins at age 40.  Direct examination of the colon should be repeated every 5-10 years through 65 years of age. However, you may need to be screened more often if early forms of precancerous polyps or small growths are found.  Skin Cancer  Check your skin from head to toe regularly.  Tell your health care provider  about any new moles or changes in moles, especially if there is a change in a mole's shape or color.  Also tell your health care provider if you have a mole that is larger than the size of a pencil eraser.  Always use sunscreen. Apply sunscreen liberally and repeatedly throughout the day.  Protect yourself by wearing long sleeves, pants, a wide-brimmed hat, and sunglasses whenever you are outside.  Heart disease, diabetes, and high blood pressure  High blood pressure causes heart disease and increases the risk of stroke. High blood pressure is more likely to develop in: ? People who have blood pressure in the high end of the normal range (130-139/85-89 mm Hg). ? People who are overweight or obese. ? People who are African American.  If you are 83-3 years of age, have your blood pressure checked every 3-5 years. If you are 79 years of age or older, have your blood  pressure checked every year. You should have your blood pressure measured twice-once when you are at a hospital or clinic, and once when you are not at a hospital or clinic. Record the average of the two measurements. To check your blood pressure when you are not at a hospital or clinic, you can use: ? An automated blood pressure machine at a pharmacy. ? A home blood pressure monitor.  If you are between 95 years and 47 years old, ask your health care provider if you should take aspirin to prevent strokes.  Have regular diabetes screenings. This involves taking a blood sample to check your fasting blood sugar level. ? If you are at a normal weight and have a low risk for diabetes, have this test once every three years after 65 years of age. ? If you are overweight and have a high risk for diabetes, consider being tested at a younger age or more often. Preventing infection Hepatitis B  If you have a higher risk for hepatitis B, you should be screened for this virus. You are considered at high risk for hepatitis B if: ? You were born in a country where hepatitis B is common. Ask your health care provider which countries are considered high risk. ? Your parents were born in a high-risk country, and you have not been immunized against hepatitis B (hepatitis B vaccine). ? You have HIV or AIDS. ? You use needles to inject street drugs. ? You live with someone who has hepatitis B. ? You have had sex with someone who has hepatitis B. ? You get hemodialysis treatment. ? You take certain medicines for conditions, including cancer, organ transplantation, and autoimmune conditions.  Hepatitis C  Blood testing is recommended for: ? Everyone born from [birth year] through 1963. ? Anyone with known risk factors for hepatitis C.  Sexually transmitted infections (STIs)  You should be screened for sexually transmitted infections (STIs) including gonorrhea and chlamydia if: ? You are sexually active and are  younger than 65 years of age. ? You are older than 65 years of age and your health care provider tells you that you are at risk for this type of infection. ? Your sexual activity has changed since you were last screened and you are at an increased risk for chlamydia or gonorrhea. Ask your health care provider if you are at risk.  If you do not have HIV, but are at risk, it may be recommended that you take a prescription medicine daily to prevent HIV infection. This is called pre-exposure prophylaxis (PrEP). You are  considered at risk if: ? You are sexually active and do not regularly use condoms or know the HIV status of your partner(s). ? You take drugs by injection. ? You are sexually active with a partner who has HIV.  Talk with your health care provider about whether you are at high risk of being infected with HIV. If you choose to begin PrEP, you should first be tested for HIV. You should then be tested every 3 months for as long as you are taking PrEP. Pregnancy  If you are premenopausal and you may become pregnant, ask your health care provider about preconception counseling.  If you may become pregnant, take 400 to 800 micrograms (mcg) of folic acid every day.  If you want to prevent pregnancy, talk to your health care provider about birth control (contraception). Osteoporosis and menopause  Osteoporosis is a disease in which the bones lose minerals and strength with aging. This can result in serious bone fractures. Your risk for osteoporosis can be identified using a bone density scan.  If you are 39 years of age or older, or if you are at risk for osteoporosis and fractures, ask your health care provider if you should be screened.  Ask your health care provider whether you should take a calcium or vitamin D supplement to lower your risk for osteoporosis.  Menopause may have certain physical symptoms and risks.  Hormone replacement therapy may reduce some of these symptoms and  risks. Talk to your health care provider about whether hormone replacement therapy is right for you. Follow these instructions at home:  Schedule regular health, dental, and eye exams.  Stay current with your immunizations.  Do not use any tobacco products including cigarettes, chewing tobacco, or electronic cigarettes.  If you are pregnant, do not drink alcohol.  If you are breastfeeding, limit how much and how often you drink alcohol.  Limit alcohol intake to no more than 1 drink per day for nonpregnant women. One drink equals 12 ounces of beer, 5 ounces of wine, or 1 ounces of hard liquor.  Do not use street drugs.  Do not share needles.  Ask your health care provider for help if you need support or information about quitting drugs.  Tell your health care provider if you often feel depressed.  Tell your health care provider if you have ever been abused or do not feel safe at home. This information is not intended to replace advice given to you by your health care provider. Make sure you discuss any questions you have with your health care provider. Document Released: 09/13/2010 Document Revised: 08/06/2015 Document Reviewed: 12/02/2014 Elsevier Interactive Patient Education  [employer].

## 2017-04-02 NOTE — Progress Notes (Signed)
Subjective:    Patient ID: Victoria Stone, female    DOB: 1951-01-09, 67 y.o.   MRN: [id number]  HPI She is here for a physical exam.   She thinks she has carpal tunnel in the right wrist.  She has tingling.  She uses a brace at night.  GERD:  She is taking her medication twice daily as prescribed.  It has not been controlled for 6 months.  She has GERD at least once a day.  It lasts about 5 minutes.     Tenderness in center lower lip;  Since the summer.  No skin changes. Has had several sunburns in past.    Medications and allergies reviewed with patient and updated if appropriate.  Patient Active Problem List   Diagnosis Date Noted  . Family history of premature CAD 07/20/2016  . GERD (gastroesophageal reflux disease) 02/08/2016  . Chest pain 07/15/2015  . Obese   . Dyslipidemia   . Adjustment disorder with mixed anxiety and depressed mood 11/20/2008  . ALLERGIC RHINITIS 11/20/2008  . Ulcerative colitis ([place]) 11/20/2008  . ARTHRITIS 11/20/2008    Current Outpatient Medications on File Prior to Visit  Medication Sig Dispense Refill  . aspirin (ASPIRIN CHILDRENS) 81 MG chewable tablet Chew 1 tablet (81 mg total) by mouth daily.    Marland Kitchen atorvastatin (LIPITOR) 20 MG tablet TAKE 1 TABLET BY MOUTH DAILY. 90 tablet 1  . Calcium Carbonate (CALCIUM 600 PO) Take by mouth daily.    Marland Kitchen FLUoxetine (PROZAC) 20 MG capsule TAKE 1 CAPSULE BY MOUTH DAILY. 30 capsule 0  . meloxicam (MOBIC) 7.5 MG tablet Take 1 tablet (7.5 mg total) by mouth daily. 90 tablet 0  . Multiple Vitamins-Minerals (MULTIVITAMIN) tablet Take 1 tablet by mouth daily.    . Omega-3 Fatty Acids (FISH OIL) 1200 MG CAPS Take by mouth 2 (two) times daily.      . ranitidine (ZANTAC) 150 MG tablet Take 1 tablet (150 mg total) by mouth 2 (two) times daily. 180 tablet 0  . triamcinolone ointment (KENALOG) 0.5 % Apply 1 application topically 2 (two) times daily as needed (foot rash).     No current facility-administered medications  on file prior to visit.     Past Medical History:  Diagnosis Date  . ALLERGIC RHINITIS   . ARTHRITIS   . Dyslipidemia   . Family history of premature coronary artery disease 07/15/2015  . Obese   . ULCERATIVE COLITIS     Past Surgical History:  Procedure Laterality Date  . ABDOMINAL HYSTERECTOMY  2001  . BONE SPUR    . BREAST SURGERY  1988   Reduction  . FOOT SURGERY  90, 04, 09   X'S 3  . HAMMER TOE SURGERY    . LAPAROSCOPIC GASTRIC BANDING  2005  . TUBAL LIGATION  1988    Social History   Socioeconomic History  . Marital status: Married    Spouse name: None  . Number of children: 2  . Years of education: None  . Highest education level: None  Social Needs  . Financial resource strain: None  . Food insecurity - worry: None  . Food insecurity - inability: None  . Transportation needs - medical: None  . Transportation needs - non-medical: None  Occupational History  . Occupation: Programmer, multimedia: [employer]  Tobacco Use  . Smoking status: Never Smoker  . Smokeless tobacco: Never Used  . Tobacco comment: Married, 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[facility]  Substance and Sexual Activity  . Alcohol use: No    Comment: occasionally  . Drug use: No  . Sexual activity: None  Other Topics Concern  . None  Social History Narrative   RN at [employer]   Married, lives with spouse    Family History  Problem Relation Age of Onset  . Hypertension Mother   . Lymphoma Mother   . Other Mother        PACEMAKER  . Heart disease Mother   . Heart attack Mother   . Hypertension Father   . Heart disease Father   . Heart attack Father   . Hypertension Sister   . Diabetes Sister   . Heart attack Sister   . Heart disease Brother   . Breast cancer Sister   . Hypertension Maternal Grandmother   . Hypertension Paternal Grandmother   . Hypertension Paternal Grandfather     Review of Systems  Constitutional: Negative for chills, fatigue and fever.  Eyes: Negative  for visual disturbance.  Respiratory: Positive for cough (with GERD). Negative for shortness of breath and wheezing.   Cardiovascular: Negative for chest pain, palpitations and leg swelling.  Gastrointestinal: Negative for abdominal pain, blood in stool, constipation, diarrhea and nausea.       Gerd  Genitourinary: Negative for dysuria and hematuria.  Musculoskeletal: Positive for arthralgias (knees, shoulder). Negative for back pain (just stiffness).  Skin: Negative for color change and rash.  Neurological: Positive for numbness (tingling R > L  -CTS). Negative for dizziness, light-headedness and headaches.  Psychiatric/Behavioral: Negative for dysphoric mood. The patient is not nervous/anxious.        Objective:   Vitals:   04/04/17 0808  BP: 118/88  Pulse: 67  Resp: 16  Temp: 97.9 F (36.6 C)  SpO2: 97%   Filed Weights   04/04/17 0808  Weight: 156 lb (70.8 kg)   Body mass index is 25.96 kg/m.  Wt Readings from Last 3 Encounters:  04/04/17 156 lb (70.8 kg)  07/20/16 161 lb (73 kg)  02/08/16 174 lb (78.9 kg)     Physical Exam Constitutional: She appears well-developed and well-nourished. No distress.  HENT:  Head: Normocephalic and atraumatic.  Right Ear: External ear normal. Normal ear canal and TM Left Ear: External ear normal.  Normal ear canal and TM Mouth/Throat: Oropharynx is clear and moist.  Eyes: Conjunctivae and EOM are normal.  Neck: Neck supple. No tracheal deviation present. No thyromegaly present.  No carotid bruit  Cardiovascular: Normal rate, regular rhythm and normal heart sounds.   No murmur heard.  No edema. Pulmonary/Chest: Effort normal and breath sounds normal. No respiratory distress. She has no wheezes. She has no rales.  Breast: deferred to Gyn Abdominal: Soft. She exhibits no distension. There is no tenderness.  Lymphadenopathy: She has no cervical adenopathy.  Skin: Skin is warm and dry. She is not diaphoretic. no skin changes in lower  lip Psychiatric: She has a normal mood and affect. Her behavior is normal.        Assessment & Plan:   Physical exam: Screening blood work ordered Immunizations  Discussed shingrix, others up to date Colonoscopy  Up to date  Mammogram   Up to date  Gyn  Up to date  Dexa   Up to date  Eye exams  Up to date  EKG   Done 07/2015 Exercise  Walk, yoga Weight has lost over the past year Skin   Lip tenderness - no  skin changes - consider derm evaluation Substance abuse  none  See Problem List for Assessment and Plan of chronic medical problems.   Fu in one year

## 2017-04-04 ENCOUNTER — Ambulatory Visit (INDEPENDENT_AMBULATORY_CARE_PROVIDER_SITE_OTHER): Payer: 59 | Admitting: Internal Medicine

## 2017-04-04 ENCOUNTER — Encounter: Payer: Self-pay | Admitting: Internal Medicine

## 2017-04-04 ENCOUNTER — Other Ambulatory Visit (INDEPENDENT_AMBULATORY_CARE_PROVIDER_SITE_OTHER): Payer: 59

## 2017-04-04 VITALS — BP 118/88 | HR 67 | Temp 97.9°F | Resp 16 | Wt 156.0 lb

## 2017-04-04 DIAGNOSIS — G5601 Carpal tunnel syndrome, right upper limb: Secondary | ICD-10-CM | POA: Diagnosis not present

## 2017-04-04 DIAGNOSIS — K5180 Other ulcerative colitis without complications: Secondary | ICD-10-CM

## 2017-04-04 DIAGNOSIS — Z0000 Encounter for general adult medical examination without abnormal findings: Secondary | ICD-10-CM | POA: Diagnosis not present

## 2017-04-04 DIAGNOSIS — E785 Hyperlipidemia, unspecified: Secondary | ICD-10-CM

## 2017-04-04 DIAGNOSIS — F4323 Adjustment disorder with mixed anxiety and depressed mood: Secondary | ICD-10-CM

## 2017-04-04 DIAGNOSIS — K219 Gastro-esophageal reflux disease without esophagitis: Secondary | ICD-10-CM | POA: Diagnosis not present

## 2017-04-04 DIAGNOSIS — G5603 Carpal tunnel syndrome, bilateral upper limbs: Secondary | ICD-10-CM | POA: Insufficient documentation

## 2017-04-04 DIAGNOSIS — M1710 Unilateral primary osteoarthritis, unspecified knee: Secondary | ICD-10-CM

## 2017-04-04 HISTORY — DX: Carpal tunnel syndrome, right upper limb: G56.01

## 2017-04-04 LAB — COMPREHENSIVE METABOLIC PANEL
ALT: 26 U/L (ref 0–35)
AST: 20 U/L (ref 0–37)
Albumin: 4.1 g/dL (ref 3.5–5.2)
Alkaline Phosphatase: 59 U/L (ref 39–117)
BILIRUBIN TOTAL: 0.7 mg/dL (ref 0.2–1.2)
BUN: 22 mg/dL (ref 6–23)
CHLORIDE: 104 meq/L (ref 96–112)
CO2: 30 mEq/L (ref 19–32)
CREATININE: 0.77 mg/dL (ref 0.40–1.20)
Calcium: 9.4 mg/dL (ref 8.4–10.5)
GFR: 80.09 mL/min (ref >60.00)
Glucose, Bld: 114 mg/dL — ABNORMAL HIGH (ref 70–99)
Potassium: 4.5 mEq/L (ref 3.5–5.1)
Sodium: 141 mEq/L (ref 135–145)
Total Protein: 6.7 g/dL (ref 6.0–8.3)

## 2017-04-04 LAB — TSH: TSH: 2.13 u[IU]/mL (ref 0.35–4.50)

## 2017-04-04 LAB — CBC WITH DIFFERENTIAL/PLATELET
BASOS ABS: 0.1 10*3/uL (ref 0.0–0.1)
BASOS PCT: 1.1 % (ref 0.0–3.0)
EOS ABS: 0.2 10*3/uL (ref 0.0–0.7)
Eosinophils Relative: 3.7 % (ref 0.0–5.0)
HEMATOCRIT: 42.9 % (ref 36.0–46.0)
HEMOGLOBIN: 14.8 g/dL (ref 12.0–15.0)
Lymphocytes Relative: 23.8 % (ref 12.0–46.0)
Lymphs Abs: 1.4 10*3/uL (ref 0.7–4.0)
MCHC: 34.4 g/dL (ref 30.0–36.0)
MCV: 89.5 fl (ref 78.0–100.0)
Monocytes Absolute: 0.5 10*3/uL (ref 0.1–1.0)
Monocytes Relative: 7.8 % (ref 3.0–12.0)
Neutro Abs: 3.8 10*3/uL (ref 1.4–7.7)
Neutrophils Relative %: 63.6 % (ref 43.0–77.0)
Platelets: 267 10*3/uL (ref 150.0–400.0)
RBC: 4.8 Mil/uL (ref 3.87–5.11)
RDW: 13.3 % (ref 11.5–15.5)
WBC: 5.9 10*3/uL (ref 4.0–10.5)

## 2017-04-04 LAB — LIPID PANEL
CHOLESTEROL: 147 mg/dL (ref 0–200)
HDL: 67.6 mg/dL (ref >39.00)
LDL Cholesterol: 62 mg/dL (ref 0–99)
NONHDL: 79.84
Total CHOL/HDL Ratio: 2
Triglycerides: 87 mg/dL (ref 0.0–149.0)
VLDL: 17.4 mg/dL (ref 0.0–40.0)

## 2017-04-04 MED ORDER — FLUOXETINE HCL 20 MG PO CAPS: 20.0000 mg | ORAL_CAPSULE | Freq: Every day | ORAL | 3 refills | Status: DC

## 2017-04-04 MED ORDER — OMEPRAZOLE 20 MG PO CPDR: 20.0000 mg | DELAYED_RELEASE_CAPSULE | Freq: Every day | ORAL | 3 refills | Status: DC

## 2017-04-04 MED ORDER — TRIAMCINOLONE ACETONIDE 0.5 % EX OINT: 1.0000 "application " | TOPICAL_OINTMENT | Freq: Two times a day (BID) | CUTANEOUS | 1 refills | Status: DC | PRN

## 2017-04-04 MED ORDER — ZOSTER VAC RECOMB ADJUVANTED 50 MCG/0.5ML IM SUSR: 0.5000 mL | Freq: Once | INTRAMUSCULAR | 1 refills | Status: AC

## 2017-04-04 MED FILL — TRIAMCINOLONE 0.5% OINTMENT: 0.5 | 14 days supply | Qty: 30 | Fill #0 | Status: TO

## 2017-04-04 MED FILL — OMEPRAZOLE 20 MG CAP: 20 | 90 days supply | Qty: 90 | Fill #0 | Status: TO

## 2017-04-04 NOTE — Assessment & Plan Note (Addendum)
Check lipid panel  Continue statin 4/week Regular exercise and healthy diet encouraged

## 2017-04-04 NOTE — Assessment & Plan Note (Signed)
Taking meloxicam 3/week ? Helpful - advised to try to stop Try tylenol arthritis Try biofreeze

## 2017-04-04 NOTE — Assessment & Plan Note (Signed)
Controlled, stable Continue current dose of medication  

## 2017-04-04 NOTE — Assessment & Plan Note (Signed)
Seen on prior colonoscopy asymptomatic

## 2017-04-04 NOTE — Assessment & Plan Note (Addendum)
Not controlled Taking zantac 150 mg bid Having daily symptoms Start omeprazole 20 mg daily - goal is to get off medication gerd diet Advised stopping meloxicam if it is not helpful enough for her arthritis

## 2017-04-04 NOTE — Assessment & Plan Note (Signed)
Tingling, no pain or weakness Using brace Advised to let me know if she wants to see ortho

## 2017-04-05 ENCOUNTER — Other Ambulatory Visit (INDEPENDENT_AMBULATORY_CARE_PROVIDER_SITE_OTHER): Payer: 59

## 2017-04-05 DIAGNOSIS — R739 Hyperglycemia, unspecified: Secondary | ICD-10-CM

## 2017-04-05 LAB — HEMOGLOBIN A1C: HEMOGLOBIN A1C: 5.7 % (ref 4.6–6.5)

## 2017-04-05 MED FILL — SHINGRIX VIAL KIT: 50 | 1 days supply | Qty: 1 | Fill #0

## 2017-04-06 ENCOUNTER — Encounter: Payer: Self-pay | Admitting: Internal Medicine

## 2017-04-06 DIAGNOSIS — R7303 Prediabetes: Secondary | ICD-10-CM

## 2017-04-06 HISTORY — DX: Prediabetes: R73.03

## 2017-04-18 MED FILL — FLUoxetine HCL 20 MG CAPS: 20 | 90 days supply | Qty: 90 | Fill #0

## 2017-05-24 DIAGNOSIS — H02055 Trichiasis without entropian left lower eyelid: Secondary | ICD-10-CM | POA: Diagnosis not present

## 2017-05-24 DIAGNOSIS — H5213 Myopia, bilateral: Secondary | ICD-10-CM | POA: Diagnosis not present

## 2017-05-24 DIAGNOSIS — H524 Presbyopia: Secondary | ICD-10-CM | POA: Diagnosis not present

## 2017-06-05 MED FILL — SHINGRIX VIAL KIT: 50 | 1 days supply | Qty: 1 | Fill #1

## 2017-07-21 ENCOUNTER — Other Ambulatory Visit: Payer: Self-pay | Admitting: Internal Medicine

## 2017-07-21 MED FILL — FLUoxetine HCL 20 MG CAPS: 20 | 90 days supply | Qty: 90 | Fill #1 | Status: TO

## 2017-07-24 MED FILL — raNITIdine HCL 150 MG TABS: 150 | 90 days supply | Qty: 180 | Fill #0

## 2017-09-18 ENCOUNTER — Encounter: Payer: Self-pay | Admitting: Cardiology

## 2017-09-18 ENCOUNTER — Ambulatory Visit (INDEPENDENT_AMBULATORY_CARE_PROVIDER_SITE_OTHER): Payer: Medicare Other | Admitting: Cardiology

## 2017-09-18 VITALS — BP 130/70 | HR 66 | Ht 65.0 in | Wt 154.8 lb

## 2017-09-18 DIAGNOSIS — E785 Hyperlipidemia, unspecified: Secondary | ICD-10-CM

## 2017-09-18 DIAGNOSIS — Z8249 Family history of ischemic heart disease and other diseases of the circulatory system: Secondary | ICD-10-CM | POA: Diagnosis not present

## 2017-09-18 MED ORDER — ATORVASTATIN CALCIUM 20 MG PO TABS: 20.0000 mg | ORAL_TABLET | Freq: Every day | ORAL | 3 refills | Status: DC

## 2017-09-18 NOTE — Progress Notes (Signed)
Cardiology Office Note:    Date:  09/18/2017   ID:  Victoria Stone, DOB [birth year], MRN [id number]  PCP:  Binnie Rail, MD  Cardiologist:  No primary care provider on file.    Referring MD: Binnie Rail, MD   Chief Complaint  Patient presents with  . Follow-up    fm history of CAD, hyperlipidemia    History of Present Illness:    Victoria Stone is a 67 y.o. female with a hx of hyperlipidemia and a strong family history of coronary disease with her brother dying of a cardiac arrest in his late 39s and autopsy showed severe CAD and also a sister with PCI's and ultimately CABG at age 86.  She had a coronary calcium score done in 2010 which was 0 and a stress test in 2018 was normal.  She is here today for followup and is doing well.  She denies any chest pain or pressure, SOB, DOE, PND, orthopnea, LE edema, dizziness, palpitations or syncope. She is compliant with her meds and is tolerating meds with no SE.    Past Medical History:  Diagnosis Date  . ALLERGIC RHINITIS   . ARTHRITIS   . Arthritis of knee 11/20/2008  . Carpal tunnel syndrome on right 04/04/2017  . Chest pain 07/15/2015  . Dyslipidemia   . Family history of premature coronary artery disease 07/15/2015  . GERD (gastroesophageal reflux disease) 02/08/2016  . Obese   . Prediabetes 04/06/2017  . ULCERATIVE COLITIS   . Ulcerative colitis ([place]) 11/20/2008    Past Surgical History:  Procedure Laterality Date  . ABDOMINAL HYSTERECTOMY  2001  . BONE SPUR    . BREAST SURGERY  1988   Reduction  . FOOT SURGERY  90, 04, 09   X'S 3  . HAMMER TOE SURGERY    . LAPAROSCOPIC GASTRIC BANDING  2005  . TUBAL LIGATION  1988    Current Medications: Current Meds  Medication Sig  . atorvastatin (LIPITOR) 20 MG tablet TAKE 1 TABLET BY MOUTH DAILY.  . Calcium Carbonate (CALCIUM 600 PO) Take by mouth daily.  Marland Kitchen FLUoxetine (PROZAC) 20 MG capsule Take 1 capsule (20 mg total) by mouth daily.  . meloxicam (MOBIC) 7.5 MG tablet Take 7.5 mg by  mouth every other day.  . Multiple Vitamins-Minerals (MULTIVITAMIN) tablet Take 1 tablet by mouth daily.  . Omega-3 Fatty Acids (FISH OIL) 1200 MG CAPS Take by mouth 2 (two) times daily.    Marland Kitchen omeprazole (PRILOSEC) 20 MG capsule Take 1 capsule (20 mg total) by mouth daily.  . ranitidine (ZANTAC) 150 MG tablet Take 1 tablet (150 mg total) by mouth 2 (two) times daily.  Marland Kitchen triamcinolone ointment (KENALOG) 0.5 % Apply 1 application topically 2 (two) times daily as needed (foot rash).     Allergies:   Penicillins   Social History   Socioeconomic History  . Marital status: Married    Spouse name: Not on file  . Number of children: 2  . Years of education: Not on file  . Highest education level: Not on file  Occupational History  . Occupation: Programmer, multimedia: [employer]  . Financial resource strain: Not on file  . Food insecurity:    Worry: Not on file    Inability: Not on file  . Transportation needs:    Medical: Not on file    Non-medical: Not on file  Tobacco Use  . Smoking status: Never Smoker  .  Smokeless tobacco: Never Used  . Tobacco comment: Married, 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 Alcohol use: No    Comment: occasionally  . Drug use: No  . Sexual activity: Not on file  Lifestyle  . Physical activity:    Days per week: Not on file    Minutes per session: Not on file  . Stress: Not on file  Relationships  . Social connections:    Talks on phone: Not on file    Gets together: Not on file    Attends religious service: Not on file    Active member of club or organization: Not on file    Attends meetings of clubs or organizations: Not on file    Relationship status: Not on file  Other Topics Concern  . Not on file  Social History Narrative   RN at [employer]   Married, lives with spouse     Family History: The patient's family history includes Breast cancer in her brother and sister; Diabetes in her  sister; Heart attack in her father, mother, and sister; Heart disease in her brother, father, mother, and sister; Hypertension in her father, maternal grandmother, mother, paternal grandfather, paternal grandmother, and sister; Lymphoma in her mother; Other in her mother.  ROS:   Please see the history of present illness.    ROS  All other systems reviewed and negative.   EKGs/Labs/Other Studies Reviewed:    The following studies were reviewed today: none  EKG:  EKG is  ordered today.  The ekg ordered today demonstrates NSR at 64bpm with no ST changes  Recent Labs: 04/04/2017: ALT 26; BUN 22; Creatinine, Ser 0.77; Hemoglobin 14.8; Platelets 267.0; Potassium 4.5; Sodium 141; TSH 2.13   Recent Lipid Panel    Component Value Date/Time   CHOL 147 04/04/2017 0848   CHOL 189 11/07/2014 0933   TRIG 87.0 04/04/2017 0848   TRIG 159 (H) 11/07/2014 0933   HDL 67.60 04/04/2017 0848   HDL 62 11/07/2014 0933   CHOLHDL 2 04/04/2017 0848   VLDL 17.4 04/04/2017 0848   LDLCALC 62 04/04/2017 0848   LDLCALC 95 11/07/2014 0933    Physical Exam:    VS:  BP 130/70 (BP Location: Left Arm, Patient Position: Sitting, Cuff Size: Normal)   Pulse 66   Ht 5' 5"  (1.651 m)   Wt 154 lb 12.8 oz (70.2 kg)   SpO2 98%   BMI 25.76 kg/m     Wt Readings from Last 3 Encounters:  09/18/17 154 lb 12.8 oz (70.2 kg)  04/04/17 156 lb (70.8 kg)  07/20/16 161 lb (73 kg)     GEN:  Well nourished, well developed in no acute distress HEENT: Normal NECK: No JVD; No carotid bruits LYMPHATICS: No lymphadenopathy CARDIAC: thank you unloved there isRRR, no murmurs, rubs, gallops RESPIRATORY:  Clear to auscultation without rales, wheezing or rhonchi  ABDOMEN: Soft, non-tender, non-distended MUSCULOSKELETAL:  No edema; No deformity  SKIN: Warm and dry NEUROLOGIC:  Alert and oriented x 3 PSYCHIATRIC:  Normal affect   ASSESSMENT:    1. Family history of premature CAD   2. Dyslipidemia    PLAN:    In order of  problems listed above:  1.  Family hx of premature CAD - she has had multiple family members who have had CAD diagnosed in their 48s.  She had a calcium score done in 2010 and 2018 that was 0.  Her stress test a year  ago was normal.  She denies any anginal symptoms.  2.  Hyperlipidemia her LDL was 62 on 04/04/2017 and ALT was normal at 26. She will continue on atorvastatin 20 mg daily.   Medication Adjustments/Labs and Tests Ordered: Current medicines are reviewed at length with the patient today.  Concerns regarding medicines are outlined above.  Orders Placed This Encounter  Procedures  . EKG 12-Lead   No orders of the defined types were placed in this encounter.   Signed, Fransico Him, MD  09/18/2017 9:20 AM    [facility]

## 2017-09-18 NOTE — Patient Instructions (Signed)
Medication Instructions:  Your physician recommends that you continue on your current medications as directed. Please refer to the Current Medication list given to you today.  If you need a refill on your cardiac medications, please contact your pharmacy first.  Labwork: None ordered   Testing/Procedures: None ordered   Follow-Up: Your physician wants you to follow-up in: 1 year with Dr. Turner. You will receive a reminder letter in the mail two months in advance. If you don't receive a letter, please call our office to schedule the follow-up appointment.  Any Other Special Instructions Will Be Listed Below (If Applicable).   Thank you for choosing [facility]    Rena Lorice Lafave, RN  [phone]  If you need a refill on your cardiac medications before your next appointment, please call your pharmacy.   

## 2017-09-18 NOTE — Addendum Note (Signed)
Addended by: Fransico Him R on: 09/18/2017 04:41 PM   Modules accepted: Miquel Dunn

## 2017-10-29 NOTE — Patient Instructions (Addendum)
  Ms. Baynes , Thank you for taking time to come for your Medicare Wellness Visit. I appreciate your ongoing commitment to your health goals. Please review the following plan we discussed and let me know if I can assist you in the future.   These are the goals we discussed: Goals   Restart more regular exercise when you are able     This is a list of the screening recommended for you and due dates:  Health Maintenance  Topic Date Due  . Pneumonia vaccines (1 of 2 - PCV13) 10/08/2017  . Flu Shot  10/12/2017  . Mammogram  03/11/2018  . Tetanus Vaccine  12/05/2020  . Colon Cancer Screening  02/13/2021  . DEXA scan (bone density measurement)  03/11/2021  .  Hepatitis C: One time screening is recommended by Center for Disease Control  (CDC) for  adults born from [birth year] through 1963.   Completed  . HIV Screening  Completed      All other Health Maintenance issues reviewed.   All recommended immunizations and age-appropriate screenings are up-to-date or discussed.  prevnar immunization administered today.   Medications reviewed and updated.  No changes recommended at this time.  A referral was ordered for orthopedics.   Please followup in 6 months

## 2017-10-29 NOTE — Progress Notes (Signed)
Subjective:    Patient ID: Victoria Stone, female    DOB: June 18, 1950, 67 y.o.   MRN: [id number]  HPI Here for medicare wellness exam and follow up of her chronic medical problems.   I have personally reviewed and have noted 1.The patient's medical and social history 2.Their use of alcohol, tobacco or illicit drugs 3.Their current medications and supplements 4.The patient's functional ability including ADL's, fall risks, home                 safety risk and hearing or visual impairment. 5.Diet and physical activities 6.Evidence for depression or mood disorders 7.Care team reviewed  -  Cardio - Dr Radford Pax, derm - dr Wilhemina Bonito    GERD:  She is taking zantac twice daily and take the prilosec only as needed.  She has GERD about twice a week.    Hyperlipidemia: She is taking her medication daily. She is compliant with a low fat/cholesterol diet. She is exercising, but not as regular as she was. She denies myalgias.   Anxiety: She is taking her medication daily as prescribed. She denies any side effects from the medication. She feels her anxiety is overall controlled and she is happy with her current dose of medication.   Right carpal tunnel syndrome: She does use her brace intermittently when it flares up.  She is interested in seeing orthopedics and considering surgery.  Are there smokers in your home (other than you)? No  Risk Factors Exercise:   Walking some, gong to pool - not as much as she was previously due to a very stressful and busy summer. Dietary issues discussed:  Fair-overall she eats fairly healthy and well-balanced, but this summer because of increased stress and several things going on she has not been eating as healthy.  Vitamin and supplement use:   Calcium and vitamin D, multivitamin, fish oil  Opiod use: None  Cardiac risk factors: advanced age, hyperlipidemia  Depression Screen  Have you felt down,  depressed or hopeless? No  Have you felt little interest or pleasure in doing things?  No  Activities of Daily Living In your present state of health, do you have any difficulty performing the following activities?:  Driving? No Managing money?  No Feeding yourself? No Getting from bed to chair? No Climbing a flight of stairs? No Preparing food and eating?: No Bathing or showering? No Getting dressed: No Getting to/using the toilet? No Moving around from place to place: No In the past year have you fallen or had a near fall?: No   Are you sexually active?  yes  Do you have more than one partner?  No   Hearing Difficulties: No Do you often ask people to speak up or repeat themselves? No Do you experience ringing or noises in your ears? No Do you have difficulty understanding soft or whispered voices? No Vision:              Any change in vision: no             Up to date with eye exam:   Up to date   Memory:  Do you feel that you have a problem with memory? No  Do you often misplace items? No  Do you feel safe at home?  Yes  Cognitive Testing  Alert, Orientated? Yes  Normal Appearance? Yes  Recall of three objects?  Yes  Can perform simple calculations? Yes  Displays appropriate judgment? Yes  Can read  the correct time from a watch face? Yes    Advanced Directives have been discussed with the patient? Yes - in place, plans on revise     Medications and allergies reviewed with patient and updated if appropriate.  Patient Active Problem List   Diagnosis Date Noted  . Prediabetes 04/06/2017  . Carpal tunnel syndrome on right 04/04/2017  . Family history of premature CAD 07/20/2016  . GERD (gastroesophageal reflux disease) 02/08/2016  . Chest pain 07/15/2015  . Dyslipidemia   . Adjustment disorder with mixed anxiety and depressed mood 11/20/2008  . ALLERGIC RHINITIS 11/20/2008  . Ulcerative colitis ([place]) 11/20/2008  . Arthritis of knee 11/20/2008    Current  Outpatient Medications on File Prior to Visit  Medication Sig Dispense Refill  . atorvastatin (LIPITOR) 20 MG tablet Take 1 tablet (20 mg total) by mouth daily. 90 tablet 3  . Calcium Carbonate (CALCIUM 600 PO) Take by mouth daily.    Marland Kitchen FLUoxetine (PROZAC) 20 MG capsule Take 1 capsule (20 mg total) by mouth daily. 90 capsule 3  . meloxicam (MOBIC) 7.5 MG tablet Take 7.5 mg by mouth every other day.    . Multiple Vitamins-Minerals (MULTIVITAMIN) tablet Take 1 tablet by mouth daily.    . Omega-3 Fatty Acids (FISH OIL) 1200 MG CAPS Take by mouth 2 (two) times daily.      Marland Kitchen omeprazole (PRILOSEC) 20 MG capsule Take 1 capsule (20 mg total) by mouth daily. 90 capsule 3  . ranitidine (ZANTAC) 150 MG tablet Take 1 tablet (150 mg total) by mouth 2 (two) times daily. 180 tablet 0  . triamcinolone ointment (KENALOG) 0.5 % Apply 1 application topically 2 (two) times daily as needed (foot rash). 30 g 1   No current facility-administered medications on file prior to visit.     Past Medical History:  Diagnosis Date  . ALLERGIC RHINITIS   . ARTHRITIS   . Arthritis of knee 11/20/2008  . Carpal tunnel syndrome on right 04/04/2017  . Chest pain 07/15/2015  . Dyslipidemia   . Family history of premature coronary artery disease 07/15/2015  . GERD (gastroesophageal reflux disease) 02/08/2016  . Obese   . Prediabetes 04/06/2017  . ULCERATIVE COLITIS   . Ulcerative colitis ([place]) 11/20/2008    Past Surgical History:  Procedure Laterality Date  . ABDOMINAL HYSTERECTOMY  2001  . BONE SPUR    . BREAST SURGERY  1988   Reduction  . FOOT SURGERY  90, 04, 09   X'S 3  . HAMMER TOE SURGERY    . LAPAROSCOPIC GASTRIC BANDING  2005  . TUBAL LIGATION  1988    Social History   Socioeconomic History  . Marital status: Married    Spouse name: Not on file  . Number of children: 2  . Years of education: Not on file  . Highest education level: Not on file  Occupational History  . Occupation: Programmer, multimedia: [employer]  . Financial resource strain: Not on file  . Food insecurity:    Worry: Not on file    Inability: Not on file  . Transportation needs:    Medical: Not on file    Non-medical: Not on file  Tobacco Use  . Smoking status: Never Smoker  . Smokeless tobacco: Never Used  . Tobacco comment: Married, 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 Alcohol use: No    Comment: occasionally  . Drug use:  No  . Sexual activity: Not on file  Lifestyle  . Physical activity:    Days per week: Not on file    Minutes per session: Not on file  . Stress: Not on file  Relationships  . Social connections:    Talks on phone: Not on file    Gets together: Not on file    Attends religious service: Not on file    Active member of club or organization: Not on file    Attends meetings of clubs or organizations: Not on file    Relationship status: Not on file  Other Topics Concern  . 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 Hypertension Mother   . Lymphoma Mother   . Other Mother        PACEMAKER  . Heart disease Mother   . Heart attack Mother   . Hypertension Father   . Heart disease Father   . Heart attack Father   . Hypertension Sister   . Diabetes Sister   . Heart attack Sister   . Heart disease Sister   . Heart disease Brother   . Breast cancer Brother   . Breast cancer Sister   . Hypertension Maternal Grandmother   . Hypertension Paternal Grandmother   . Hypertension Paternal Grandfather     Review of Systems  Constitutional: Negative for chills and fever.  Eyes: Negative for visual disturbance.  Respiratory: Negative for cough, shortness of breath and wheezing.   Cardiovascular: Negative for chest pain, palpitations and leg swelling.  Gastrointestinal: Negative for abdominal pain, constipation, diarrhea, nausea and rectal pain.        Jerrye Bushy 2/week  Genitourinary: Negative for dysuria.  Musculoskeletal: Positive for arthralgias (knees).  Neurological: Negative for light-headedness and headaches.  Psychiatric/Behavioral: Negative for dysphoric mood. The patient is nervous/anxious.        Objective:   Vitals:   10/31/17 1105  BP: 134/84  Pulse: 62  Resp: 16  Temp: 97.6 F (36.4 C)  SpO2: 98%   Filed Weights   10/31/17 1105  Weight: 151 lb (68.5 kg)   Body mass index is 25.13 kg/m.  Wt Readings from Last 3 Encounters:  10/31/17 151 lb (68.5 kg)  09/18/17 154 lb 12.8 oz (70.2 kg)  04/04/17 156 lb (70.8 kg)     Visual Acuity Screening   Right eye Left eye Both eyes  Without correction:     With correction: 20/25 20/30 20/20      Physical Exam Constitutional: She appears well-developed and well-nourished. No distress.  HENT:  Head: Normocephalic and atraumatic.  Right Ear: External ear normal. Normal ear canal and TM Left Ear: External ear normal.  Normal ear canal and TM Mouth/Throat: Oropharynx is clear and moist.  Eyes: Conjunctivae and EOM are normal.  Neck: Neck supple. No tracheal deviation present. No thyromegaly present.  No carotid bruit  Cardiovascular: Normal rate, regular rhythm and normal heart sounds.   No murmur heard.  No edema. Pulmonary/Chest: Effort normal and breath sounds normal. No respiratory distress. She has no wheezes. She has no rales.  Breast: deferred to Gyn Abdominal: Soft. She exhibits no distension. There is no tenderness.  Lymphadenopathy: She has no cervical adenopathy.  Skin: Skin is warm and dry. She is not diaphoretic.  Psychiatric: She has a normal mood and affect. Her behavior is normal.  Assessment & Plan:   Wellness Exam: Will hold off on labs - recheck labs in 6 months Immunizations  Had shingrix, prevnar today, flu this fall Colonoscopy   Up to date  Mammogram   Up to date  Dexa   Up to date  Gyn  Up to date  EKG   Done 09/2017 - no  repeat EKG needed Eye exam   Up to date  Hearing loss   none Memory concerns/difficulties  none Independent of ADLs   Fully independent Stressed the importance of regular exercise   Patient received copy of preventative screening tests/immunizations recommended for the next 5-10 years.    See Problem List for Assessment and Plan of chronic medical problems.

## 2017-10-31 ENCOUNTER — Ambulatory Visit (INDEPENDENT_AMBULATORY_CARE_PROVIDER_SITE_OTHER): Payer: Medicare Other | Admitting: Internal Medicine

## 2017-10-31 ENCOUNTER — Encounter: Payer: Self-pay | Admitting: Internal Medicine

## 2017-10-31 VITALS — BP 134/84 | HR 62 | Temp 97.6°F | Resp 16 | Ht 65.0 in | Wt 151.0 lb

## 2017-10-31 DIAGNOSIS — Z23 Encounter for immunization: Secondary | ICD-10-CM | POA: Diagnosis not present

## 2017-10-31 DIAGNOSIS — Z0000 Encounter for general adult medical examination without abnormal findings: Secondary | ICD-10-CM | POA: Diagnosis not present

## 2017-10-31 DIAGNOSIS — E785 Hyperlipidemia, unspecified: Secondary | ICD-10-CM

## 2017-10-31 DIAGNOSIS — R7303 Prediabetes: Secondary | ICD-10-CM | POA: Diagnosis not present

## 2017-10-31 DIAGNOSIS — K219 Gastro-esophageal reflux disease without esophagitis: Secondary | ICD-10-CM

## 2017-10-31 DIAGNOSIS — F4323 Adjustment disorder with mixed anxiety and depressed mood: Secondary | ICD-10-CM

## 2017-10-31 DIAGNOSIS — G5601 Carpal tunnel syndrome, right upper limb: Secondary | ICD-10-CM | POA: Diagnosis not present

## 2017-10-31 NOTE — Assessment & Plan Note (Addendum)
Flares periodically flares Uses brace when it flares up Would like referral-referred today Dr. Fredna Dow

## 2017-10-31 NOTE — Addendum Note (Signed)
Addended by: Delice Bison E on: 10/31/2017 01:23 PM   Modules accepted: Orders

## 2017-10-31 NOTE — Assessment & Plan Note (Signed)
She is experiencing increased stress, but overall feels the dose of her medication is adequate Will continue Prozac 20 mg daily, but can increase in the near future if needed

## 2017-10-31 NOTE — Assessment & Plan Note (Signed)
6 months ago A1c was 5.7% She is doing some exercise, but not as regularly as she has been in the past-after the next month she will be able to restart more regular exercise She will work on improving her diet as well-there has been a number of stressors this summer and she has not been eating as healthy Follow-up in 6 months and we will recheck A1c at that time

## 2017-10-31 NOTE — Assessment & Plan Note (Signed)
Managed by cardiology Taking Lipitor Lipid panel well controlled

## 2017-10-31 NOTE — Assessment & Plan Note (Signed)
Not ideally controlled Taking zantac 150 mg twice daily She is taking the omeprazole only as needed, but is still having GERD twice a week.  We discussed concerns with taking the omeprazole daily, but at this point I think she needs to take it more regularly Try taking omeprazole every 2-3 days or daily for period of time to get heartburn a little bit better controlled Most likely heartburn will controlled with decreased stress and more healthy eating in the fall

## 2017-12-08 ENCOUNTER — Ambulatory Visit (INDEPENDENT_AMBULATORY_CARE_PROVIDER_SITE_OTHER): Payer: Medicare Other

## 2017-12-08 DIAGNOSIS — Z23 Encounter for immunization: Secondary | ICD-10-CM | POA: Diagnosis not present

## 2017-12-20 ENCOUNTER — Other Ambulatory Visit: Payer: Self-pay | Admitting: Internal Medicine

## 2017-12-20 DIAGNOSIS — R0789 Other chest pain: Secondary | ICD-10-CM

## 2017-12-20 MED ORDER — RANITIDINE HCL 150 MG PO TABS: 150.0000 mg | ORAL_TABLET | Freq: Two times a day (BID) | ORAL | 0 refills | Status: DC

## 2017-12-20 NOTE — Telephone Encounter (Signed)
Copied from [place] [phone] [phone]. Topic: Quick Communication - Rx Refill/Question >> Dec 20, 2017  8:20 AM Gardiner Ramus wrote: Medication: ranitidine (ZANTAC) 150 MG tablet [[id number]]   Has the patient contacted their pharmacy? Preferred Pharmacy (with phone number or street name): [id number] [phone](Phone) [phone](Fax)   Agent: Please be advised that RX refills may take up to 3 business days. We ask that you follow-up with your pharmacy.

## 2017-12-20 NOTE — Telephone Encounter (Signed)
Requested Prescriptions  Pending Prescriptions Disp Refills  . ranitidine (ZANTAC) 150 MG tablet 180 tablet 0    Sig: Take 1 tablet (150 mg total) by mouth 2 (two) times daily.     Gastroenterology:  H2 Antagonists Passed - 12/20/2017  8:27 AM      Passed - Valid encounter within last 12 months    Recent Outpatient Visits          1 month ago Wellness examination   [place], MD   8 months ago Preventative health care   [place], MD   1 year ago Preventative health care   [place], MD   2 years ago Routine general medical examination at a health care facility   [place], Jannifer Rodney, MD   3 years ago Chest tightness   [place], Darrick Penna, MD      Future Appointments            In 4 months Burns, Claudina Lick, MD [place], [facility]

## 2018-01-12 DIAGNOSIS — B078 Other viral warts: Secondary | ICD-10-CM | POA: Diagnosis not present

## 2018-01-15 DIAGNOSIS — G5603 Carpal tunnel syndrome, bilateral upper limbs: Secondary | ICD-10-CM | POA: Diagnosis not present

## 2018-02-26 ENCOUNTER — Other Ambulatory Visit: Payer: Self-pay | Admitting: Internal Medicine

## 2018-02-26 NOTE — Telephone Encounter (Signed)
Requested medication (s) are due for refill today: yes  Requested medication (s) are on the active medication list: yes  Last refill:  Last filled by historical provider  Future visit scheduled: yes  Notes to clinic:  Unable to refill per protocol. Medication last filled by historical provider.     Requested Prescriptions  Pending Prescriptions Disp Refills   meloxicam (MOBIC) 7.5 MG tablet      Sig: Take 1 tablet (7.5 mg total) by mouth every other day.     Analgesics:  COX2 Inhibitors Passed - 02/26/2018 12:32 PM      Passed - HGB in normal range and within 360 days    Hemoglobin  Date Value Ref Range Status  04/04/2017 14.8 12.0 - 15.0 g/dL Final         Passed - Cr in normal range and within 360 days    Creatinine, Ser  Date Value Ref Range Status  04/04/2017 0.77 0.40 - 1.20 mg/dL Final         Passed - Patient is not pregnant      Passed - Valid encounter within last 12 months    Recent Outpatient Visits          3 months ago Wellness examination   [place], MD   10 months ago Preventative health care   [place], MD   2 years ago Preventative health care   [place], MD   3 years ago Routine general medical examination at a health care facility   [place], Jannifer Rodney, MD   3 years ago Chest tightness   [place], Darrick Penna, MD      Future Appointments            In 2 months Burns, Claudina Lick, MD [place], [facility]

## 2018-02-26 NOTE — Telephone Encounter (Signed)
Copied from [place] [id number]. Topic: Quick Communication - Rx Refill/Question >> Feb 26, 2018 11:51 AM Selinda Flavin B, NT wrote: Medication: meloxicam (MOBIC) 7.5 MG tablet  Has the patient contacted their pharmacy? Yes.   (Agent: If no, request that the patient contact the pharmacy for the refill.) (Agent: If yes, when and what did the pharmacy advise?)  Preferred Pharmacy (with phone number or street name): [id number] [place] [place] Please be advised that RX refills may take up to 3 business days. We ask that you follow-up with your pharmacy.

## 2018-02-27 MED ORDER — MELOXICAM 7.5 MG PO TABS: 7.5000 mg | ORAL_TABLET | ORAL | 0 refills | Status: DC

## 2018-02-27 NOTE — Telephone Encounter (Signed)
You have not filled since 2018 please advise.

## 2018-03-21 DIAGNOSIS — G5603 Carpal tunnel syndrome, bilateral upper limbs: Secondary | ICD-10-CM | POA: Diagnosis not present

## 2018-03-26 ENCOUNTER — Other Ambulatory Visit: Payer: Self-pay | Admitting: Internal Medicine

## 2018-03-26 DIAGNOSIS — R0789 Other chest pain: Secondary | ICD-10-CM

## 2018-04-12 ENCOUNTER — Other Ambulatory Visit: Payer: Self-pay | Admitting: Internal Medicine

## 2018-04-21 ENCOUNTER — Other Ambulatory Visit: Payer: Self-pay | Admitting: Internal Medicine

## 2018-05-06 NOTE — Progress Notes (Signed)
Subjective:    Patient ID: Victoria Stone, female    DOB: 10/22/1950, 68 y.o.   MRN: [id number]  HPI The patient is here for follow up.  Prediabetes:  She is compliant with a low sugar/carbohydrate diet.  She is exercising irregularly - walking.  She has had a very busy last 2 months and has not been exercising as regularly.  GERD:  She is taking her omeprazole daily as needed and the zantac twice daily.  She feels GERD symptoms daily.  She feels she is fairly compliant with a GERD diet.  She does take meloxicam 3 times a week, but not daily.  She does not take any other NSAIDs.  Hyperlipidemia: She is taking her medication daily. She is compliant with a low fat/cholesterol diet. She is exercising irregularly.    Arthritis of knee: She takes the meloxicam 3 times a week.  She is walking for exercise, but somewhat irregularly because of increased stress over the past couple of months.  Medications and allergies reviewed with patient and updated if appropriate.  Patient Active Problem List   Diagnosis Date Noted  . Prediabetes 04/06/2017  . Carpal tunnel syndrome on right 04/04/2017  . Family history of premature CAD 07/20/2016  . GERD (gastroesophageal reflux disease) 02/08/2016  . Chest pain 07/15/2015  . Dyslipidemia   . Adjustment disorder with mixed anxiety and depressed mood 11/20/2008  . ALLERGIC RHINITIS 11/20/2008  . Ulcerative colitis ([place]) 11/20/2008  . Arthritis of knee 11/20/2008    Current Outpatient Medications on File Prior to Visit  Medication Sig Dispense Refill  . Calcium Carbonate (CALCIUM 600 PO) Take by mouth daily.    . Multiple Vitamins-Minerals (MULTIVITAMIN) tablet Take 1 tablet by mouth daily.    . Omega-3 Fatty Acids (FISH OIL) 1200 MG CAPS Take by mouth 2 (two) times daily.      Marland Kitchen omeprazole (PRILOSEC) 20 MG capsule Take 1 capsule (20 mg total) by mouth daily. 90 capsule 3  . triamcinolone ointment (KENALOG) 0.5 % Apply 1 application topically 2  (two) times daily as needed (foot rash). 30 g 1   No current facility-administered medications on file prior to visit.     Past Medical History:  Diagnosis Date  . ALLERGIC RHINITIS   . ARTHRITIS   . Arthritis of knee 11/20/2008  . Carpal tunnel syndrome on right 04/04/2017  . Chest pain 07/15/2015  . Dyslipidemia   . Family history of premature coronary artery disease 07/15/2015  . GERD (gastroesophageal reflux disease) 02/08/2016  . Obese   . Prediabetes 04/06/2017  . ULCERATIVE COLITIS   . Ulcerative colitis ([place]) 11/20/2008    Past Surgical History:  Procedure Laterality Date  . ABDOMINAL HYSTERECTOMY  2001  . BONE SPUR    . BREAST SURGERY  1988   Reduction  . FOOT SURGERY  90, 04, 09   X'S 3  . HAMMER TOE SURGERY    . LAPAROSCOPIC GASTRIC BANDING  2005  . TUBAL LIGATION  1988    Social History   Socioeconomic History  . Marital status: Married    Spouse name: Not on file  . Number of children: 2  . Years of education: Not on file  . Highest education level: Not on file  Occupational History  . Occupation: Programmer, multimedia: [employer]  . Financial resource strain: Not on file  . Food insecurity:    Worry: Not on file    Inability: Not  on file  . Transportation needs:    Medical: Not on file    Non-medical: Not on file  Tobacco Use  . Smoking status: Never Smoker  . Smokeless tobacco: Never Used  . Tobacco comment: Married, 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 Alcohol use: No    Comment: occasionally  . Drug use: No  . Sexual activity: Not on file  Lifestyle  . Physical activity:    Days per week: Not on file    Minutes per session: Not on file  . Stress: Not on file  Relationships  . Social connections:    Talks on phone: Not on file    Gets together: Not on file    Attends religious service: Not on file    Active member of club or organization: Not on file    Attends meetings of clubs or  organizations: Not on file    Relationship status: Not on file  Other Topics Concern  . 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 Hypertension Mother   . Lymphoma Mother   . Other Mother        PACEMAKER  . Heart disease Mother   . Heart attack Mother   . Hypertension Father   . Heart disease Father   . Heart attack Father   . Hypertension Sister   . Diabetes Sister   . Heart attack Sister   . Heart disease Sister   . Heart disease Brother   . Breast cancer Brother   . Breast cancer Sister   . Hypertension Maternal Grandmother   . Hypertension Paternal Grandmother   . Hypertension Paternal Grandfather     Review of Systems  Constitutional: Negative for chills and fever.  Respiratory: Positive for cough (with gerd). Negative for shortness of breath and wheezing.   Cardiovascular: Negative for chest pain, palpitations and leg swelling.  Gastrointestinal: Negative for abdominal pain and nausea.  Musculoskeletal: Negative for myalgias.  Neurological: Negative for light-headedness and headaches.       Objective:   Vitals:   05/08/18 0957  BP: 134/86  Pulse: 61  Resp: 16  Temp: 97.8 F (36.6 C)  SpO2: 96%   BP Readings from Last 3 Encounters:  05/08/18 134/86  10/31/17 134/84  09/18/17 130/70   Wt Readings from Last 3 Encounters:  05/08/18 166 lb (75.3 kg)  10/31/17 151 lb (68.5 kg)  09/18/17 154 lb 12.8 oz (70.2 kg)   Body mass index is 27.62 kg/m.   Physical Exam    Constitutional: Appears well-developed and well-nourished. No distress.  HENT:  Head: Normocephalic and atraumatic.  Neck: Neck supple. No tracheal deviation present. No thyromegaly present.  No cervical lymphadenopathy Cardiovascular: Normal rate, regular rhythm and normal heart sounds.   No murmur heard. No carotid bruit .  No edema Pulmonary/Chest: Effort normal and breath sounds  normal. No respiratory distress. No has no wheezes. No rales.  Skin: Skin is warm and dry. Not diaphoretic.  Psychiatric: Normal mood and affect. Behavior is normal.      Assessment & Plan:    See Problem List for Assessment and Plan of chronic medical problems.

## 2018-05-08 ENCOUNTER — Ambulatory Visit (INDEPENDENT_AMBULATORY_CARE_PROVIDER_SITE_OTHER): Payer: Medicare Other | Admitting: Internal Medicine

## 2018-05-08 ENCOUNTER — Other Ambulatory Visit (INDEPENDENT_AMBULATORY_CARE_PROVIDER_SITE_OTHER): Payer: Medicare Other

## 2018-05-08 ENCOUNTER — Encounter: Payer: Self-pay | Admitting: Internal Medicine

## 2018-05-08 VITALS — BP 134/86 | HR 61 | Temp 97.8°F | Resp 16 | Ht 65.0 in | Wt 166.0 lb

## 2018-05-08 DIAGNOSIS — R7303 Prediabetes: Secondary | ICD-10-CM | POA: Diagnosis not present

## 2018-05-08 DIAGNOSIS — K219 Gastro-esophageal reflux disease without esophagitis: Secondary | ICD-10-CM

## 2018-05-08 DIAGNOSIS — M1710 Unilateral primary osteoarthritis, unspecified knee: Secondary | ICD-10-CM

## 2018-05-08 DIAGNOSIS — E785 Hyperlipidemia, unspecified: Secondary | ICD-10-CM | POA: Diagnosis not present

## 2018-05-08 LAB — CBC WITH DIFFERENTIAL/PLATELET
Basophils Absolute: 0.1 10*3/uL (ref 0.0–0.1)
Basophils Relative: 1.3 % (ref 0.0–3.0)
Eosinophils Absolute: 0.2 10*3/uL (ref 0.0–0.7)
Eosinophils Relative: 3.3 % (ref 0.0–5.0)
HEMATOCRIT: 43.4 % (ref 36.0–46.0)
Hemoglobin: 15 g/dL (ref 12.0–15.0)
LYMPHS PCT: 19.7 % (ref 12.0–46.0)
Lymphs Abs: 1.2 10*3/uL (ref 0.7–4.0)
MCHC: 34.6 g/dL (ref 30.0–36.0)
MCV: 87.2 fl (ref 78.0–100.0)
MONOS PCT: 9.6 % (ref 3.0–12.0)
Monocytes Absolute: 0.6 10*3/uL (ref 0.1–1.0)
Neutro Abs: 4 10*3/uL (ref 1.4–7.7)
Neutrophils Relative %: 66.1 % (ref 43.0–77.0)
Platelets: 270 10*3/uL (ref 150.0–400.0)
RBC: 4.98 Mil/uL (ref 3.87–5.11)
RDW: 13.2 % (ref 11.5–15.5)
WBC: 6.1 10*3/uL (ref 4.0–10.5)

## 2018-05-08 LAB — TSH: TSH: 2.48 u[IU]/mL (ref 0.35–4.50)

## 2018-05-08 LAB — COMPREHENSIVE METABOLIC PANEL
ALT: 23 U/L (ref 0–35)
AST: 18 U/L (ref 0–37)
Albumin: 4.2 g/dL (ref 3.5–5.2)
Alkaline Phosphatase: 74 U/L (ref 39–117)
BUN: 21 mg/dL (ref 6–23)
CO2: 29 mEq/L (ref 19–32)
CREATININE: 0.84 mg/dL (ref 0.40–1.20)
Calcium: 9.3 mg/dL (ref 8.4–10.5)
Chloride: 103 mEq/L (ref 96–112)
GFR: 67.92 mL/min (ref >60.00)
Glucose, Bld: 97 mg/dL (ref 70–99)
Potassium: 4.2 mEq/L (ref 3.5–5.1)
Sodium: 141 mEq/L (ref 135–145)
Total Bilirubin: 0.8 mg/dL (ref 0.2–1.2)
Total Protein: 7 g/dL (ref 6.0–8.3)

## 2018-05-08 LAB — LIPID PANEL
Cholesterol: 147 mg/dL (ref 0–200)
HDL: 66.1 mg/dL (ref >39.00)
LDL Cholesterol: 60 mg/dL (ref 0–99)
NonHDL: 80.85
Total CHOL/HDL Ratio: 2
Triglycerides: 106 mg/dL (ref 0.0–149.0)
VLDL: 21.2 mg/dL (ref 0.0–40.0)

## 2018-05-08 LAB — HEMOGLOBIN A1C: Hgb A1c MFr Bld: 5.6 % (ref 4.6–6.5)

## 2018-05-08 MED ORDER — ATORVASTATIN CALCIUM 20 MG PO TABS: 20.0000 mg | ORAL_TABLET | Freq: Every day | ORAL | 1 refills | Status: DC

## 2018-05-08 MED ORDER — RANITIDINE HCL 150 MG PO TABS: 150.0000 mg | ORAL_TABLET | Freq: Two times a day (BID) | ORAL | 3 refills | Status: DC

## 2018-05-08 MED ORDER — MELOXICAM 7.5 MG PO TABS: 7.5000 mg | ORAL_TABLET | ORAL | 1 refills | Status: DC

## 2018-05-08 MED ORDER — FLUOXETINE HCL 20 MG PO CAPS: 20.0000 mg | ORAL_CAPSULE | Freq: Every day | ORAL | 3 refills | Status: DC

## 2018-05-08 NOTE — Assessment & Plan Note (Signed)
Check lipid panel  Continue daily statin Regular exercise and healthy diet encouraged  

## 2018-05-08 NOTE — Assessment & Plan Note (Addendum)
Taking meloxicam 7.5 mg three times a week Walking for exercise-continue CMP

## 2018-05-08 NOTE — Patient Instructions (Addendum)
  Tests ordered today. Your results will be released to [place] (or called to you) after review, usually within 72hours after test completion. If any changes need to be made, you will be notified at that same time.   Medications reviewed and updated.  Changes include :  Start the omeprazole daily.        Please followup in 6 months

## 2018-05-08 NOTE — Assessment & Plan Note (Signed)
Not ideally controlled Start omeprazole 30 minutes before meal once daily Continue ranitidine 1-3 times a day Once GERD symptoms very well controlled can try to taper slowly off omeprazole and continue ranitidine Call if no improvement

## 2018-05-08 NOTE — Assessment & Plan Note (Signed)
Check a1c Low sugar / carb diet Stressed regular exercise   

## 2018-05-09 ENCOUNTER — Encounter: Payer: Self-pay | Admitting: Internal Medicine

## 2018-05-17 DIAGNOSIS — Z124 Encounter for screening for malignant neoplasm of cervix: Secondary | ICD-10-CM | POA: Diagnosis not present

## 2018-05-17 DIAGNOSIS — Z1231 Encounter for screening mammogram for malignant neoplasm of breast: Secondary | ICD-10-CM | POA: Diagnosis not present

## 2018-05-17 DIAGNOSIS — Z6828 Body mass index (BMI) 28.0-28.9, adult: Secondary | ICD-10-CM | POA: Diagnosis not present

## 2018-05-21 ENCOUNTER — Other Ambulatory Visit: Payer: Self-pay | Admitting: Internal Medicine

## 2018-05-24 DIAGNOSIS — N958 Other specified menopausal and perimenopausal disorders: Secondary | ICD-10-CM | POA: Diagnosis not present

## 2018-06-04 ENCOUNTER — Telehealth: Payer: Self-pay | Admitting: Internal Medicine

## 2018-06-04 MED ORDER — OMEPRAZOLE 20 MG PO CPDR: 20.0000 mg | DELAYED_RELEASE_CAPSULE | Freq: Every day | ORAL | 3 refills | Status: DC

## 2018-06-04 NOTE — Telephone Encounter (Signed)
[facility]  Okay to take both medications.  (Omeprazole and Zantac)

## 2018-06-04 NOTE — Telephone Encounter (Signed)
Is this ok to take while she is on zantac.

## 2018-06-04 NOTE — Telephone Encounter (Signed)
Copied from [place] [phone]. Topic: Quick Communication - Rx Refill/Question >> Jun 04, 2018 11:40 AM Richardo Priest, NT wrote: Medication: omeprazole (PRILOSEC) 20 MG capsule, patient states she takes it off and on  Has the patient contacted their pharmacy? Yes, pharmacy advised they call and ask for refill/schedule an appointment for this medication. Last received script 04/04/2017  Preferred Pharmacy (with phone number or street name): [id number] [phone](Phone) [phone](Fax)  Agent: Please be advised that RX refills may take up to 3 business days. We ask that you follow-up with your pharmacy.

## 2018-06-04 NOTE — Addendum Note (Signed)
Addended by: Binnie Rail on: 06/04/2018 02:29 PM   Modules accepted: Orders

## 2018-07-11 ENCOUNTER — Telehealth: Payer: Self-pay | Admitting: Internal Medicine

## 2018-07-11 MED ORDER — MELOXICAM 7.5 MG PO TABS: 7.5000 mg | ORAL_TABLET | ORAL | 0 refills | Status: DC

## 2018-07-11 NOTE — Telephone Encounter (Signed)
pls advise if ok to send 90 day.Marland KitchenJohny Chess

## 2018-07-11 NOTE — Telephone Encounter (Signed)
Copied from [place] [phone]. Topic: Quick Communication - Rx Refill/Question >> Jul 11, 2018 10:59 AM Sheran Luz wrote: Medication: meloxicam (MOBIC) 7.5 MG tablet   Patient is requesting a 90 day supply of this medication. She states her last RX was only 15   Preferred Pharmacy (with phone number or street name): [id number] [phone](Phone) [phone](Fax)

## 2018-07-11 NOTE — Telephone Encounter (Signed)
ok 

## 2018-07-11 NOTE — Telephone Encounter (Signed)
[facility]

## 2018-08-08 DIAGNOSIS — G5603 Carpal tunnel syndrome, bilateral upper limbs: Secondary | ICD-10-CM | POA: Diagnosis not present

## 2018-09-05 ENCOUNTER — Telehealth: Payer: Self-pay | Admitting: Internal Medicine

## 2018-09-05 MED ORDER — MELOXICAM 7.5 MG PO TABS: 7.5000 mg | ORAL_TABLET | ORAL | 0 refills | Status: DC

## 2018-09-05 NOTE — Telephone Encounter (Signed)
Medication: meloxicam (MOBIC) 7.5 MG tablet    Patient is requesting refill of this medication. 90 day supply if possible.    Pharmacy:  [id number] [phone](Phone) [phone](Fax

## 2018-09-05 NOTE — Telephone Encounter (Signed)
Rx sent 

## 2018-09-12 ENCOUNTER — Encounter: Payer: Self-pay | Admitting: Internal Medicine

## 2018-09-12 ENCOUNTER — Other Ambulatory Visit: Payer: Self-pay

## 2018-09-12 ENCOUNTER — Ambulatory Visit (INDEPENDENT_AMBULATORY_CARE_PROVIDER_SITE_OTHER): Payer: Medicare Other | Admitting: Internal Medicine

## 2018-09-12 VITALS — BP 138/86 | HR 73 | Temp 98.0°F | Resp 16 | Ht 65.0 in

## 2018-09-12 DIAGNOSIS — H6122 Impacted cerumen, left ear: Secondary | ICD-10-CM

## 2018-09-12 DIAGNOSIS — S62001A Unspecified fracture of navicular [scaphoid] bone of right wrist, initial encounter for closed fracture: Secondary | ICD-10-CM | POA: Diagnosis not present

## 2018-09-12 DIAGNOSIS — M25531 Pain in right wrist: Secondary | ICD-10-CM | POA: Diagnosis not present

## 2018-09-12 DIAGNOSIS — S63501A Unspecified sprain of right wrist, initial encounter: Secondary | ICD-10-CM | POA: Diagnosis not present

## 2018-09-12 NOTE — Patient Instructions (Signed)
Your ear was cleaned out today.

## 2018-09-12 NOTE — Progress Notes (Signed)
Patient consent obtained. Irrigation with water and peroxide performed. Full view of tympanic membranes after procedure. Left ear.  Patient tolerated procedure well.

## 2018-09-12 NOTE — Progress Notes (Signed)
Subjective:    Patient ID: Victoria Stone, female    DOB: 02/13/1951, 68 y.o.   MRN: [id number]  HPI The patient is here for an acute visit.   Left ear clogged, decreased hearing: She is here today complaining that her left ear is clogged and she has decreased hearing.  She denies any pain.  She denies cold symptoms.    Medications and allergies reviewed with patient and updated if appropriate.  Patient Active Problem List   Diagnosis Date Noted  . Prediabetes 04/06/2017  . Carpal tunnel syndrome on right 04/04/2017  . Family history of premature CAD 07/20/2016  . GERD (gastroesophageal reflux disease) 02/08/2016  . Chest pain 07/15/2015  . Dyslipidemia   . Adjustment disorder with mixed anxiety and depressed mood 11/20/2008  . ALLERGIC RHINITIS 11/20/2008  . Ulcerative colitis ([place]) 11/20/2008  . Arthritis of knee 11/20/2008    Current Outpatient Medications on File Prior to Visit  Medication Sig Dispense Refill  . atorvastatin (LIPITOR) 20 MG tablet Take 1 tablet (20 mg total) by mouth daily. 90 tablet 1  . Calcium Carbonate (CALCIUM 600 PO) Take by mouth daily.    Marland Kitchen FLUoxetine (PROZAC) 20 MG capsule Take 1 capsule (20 mg total) by mouth daily. 90 capsule 3  . meloxicam (MOBIC) 7.5 MG tablet Take 1 tablet (7.5 mg total) by mouth every other day. 90 tablet 0  . Multiple Vitamins-Minerals (MULTIVITAMIN) tablet Take 1 tablet by mouth daily.    . Omega-3 Fatty Acids (FISH OIL) 1200 MG CAPS Take by mouth 2 (two) times daily.      Marland Kitchen omeprazole (PRILOSEC) 20 MG capsule Take 1 capsule (20 mg total) by mouth daily. 90 capsule 3  . ranitidine (ZANTAC) 150 MG tablet Take 1 tablet (150 mg total) by mouth 2 (two) times daily. 180 tablet 3  . triamcinolone ointment (KENALOG) 0.5 % Apply 1 application topically 2 (two) times daily as needed (foot rash). 30 g 1   No current facility-administered medications on file prior to visit.     Past Medical History:  Diagnosis Date  . ALLERGIC  RHINITIS   . ARTHRITIS   . Arthritis of knee 11/20/2008  . Carpal tunnel syndrome on right 04/04/2017  . Chest pain 07/15/2015  . Dyslipidemia   . Family history of premature coronary artery disease 07/15/2015  . GERD (gastroesophageal reflux disease) 02/08/2016  . Obese   . Prediabetes 04/06/2017  . ULCERATIVE COLITIS   . Ulcerative colitis ([place]) 11/20/2008    Past Surgical History:  Procedure Laterality Date  . ABDOMINAL HYSTERECTOMY  2001  . BONE SPUR    . BREAST SURGERY  1988   Reduction  . FOOT SURGERY  90, 04, 09   X'S 3  . HAMMER TOE SURGERY    . LAPAROSCOPIC GASTRIC BANDING  2005  . TUBAL LIGATION  1988    Social History   Socioeconomic History  . Marital status: Married    Spouse name: Not on file  . Number of children: 2  . Years of education: Not on file  . Highest education level: Not on file  Occupational History  . Occupation: Programmer, multimedia: [employer]  . Financial resource strain: Not on file  . Food insecurity    Worry: Not on file    Inability: Not on file  . Transportation needs    Medical: Not on file    Non-medical: Not on file  Tobacco Use  .  Smoking status: Never Smoker  . Smokeless tobacco: Never Used  . Tobacco comment: Married, 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 Alcohol use: No    Comment: occasionally  . Drug use: No  . Sexual activity: Not on file  Lifestyle  . Physical activity    Days per week: Not on file    Minutes per session: Not on file  . Stress: Not on file  Relationships  . Social Herbalist on phone: Not on file    Gets together: Not on file    Attends religious service: Not on file    Active member of club or organization: Not on file    Attends meetings of clubs or organizations: Not on file    Relationship status: Not on file  Other Topics Concern  . Not on file  Social History Narrative   RN at [employer]   Married, lives with spouse     Family History  Problem Relation Age of Onset  . Hypertension Mother   . Lymphoma Mother   . Other Mother        PACEMAKER  . Heart disease Mother   . Heart attack Mother   . Hypertension Father   . Heart disease Father   . Heart attack Father   . Hypertension Sister   . Diabetes Sister   . Heart attack Sister   . Heart disease Sister   . Heart disease Brother   . Breast cancer Brother   . Breast cancer Sister   . Hypertension Maternal Grandmother   . Hypertension Paternal Grandmother   . Hypertension Paternal Grandfather     Review of Systems  Constitutional: Negative for chills and fever.  HENT: Positive for hearing loss. Negative for congestion and sore throat.   Respiratory: Negative for cough, shortness of breath and wheezing.   Neurological: Negative for light-headedness and headaches.       Objective:   Vitals:   09/12/18 1413  BP: 138/86  Pulse: 73  Resp: 16  Temp: 98 F (36.7 C)  SpO2: 96%   BP Readings from Last 3 Encounters:  09/12/18 138/86  05/08/18 134/86  10/31/17 134/84   Wt Readings from Last 3 Encounters:  05/08/18 166 lb (75.3 kg)  10/31/17 151 lb (68.5 kg)  09/18/17 154 lb 12.8 oz (70.2 kg)   Body mass index is 27.62 kg/m.   Physical Exam Constitutional:      General: She is not in acute distress.    Appearance: Normal appearance.  HENT:     Head: Normocephalic and atraumatic.     Right Ear: Tympanic membrane, ear canal and external ear normal. There is no impacted cerumen.     Left Ear: Tympanic membrane, ear canal and external ear normal. There is no impacted cerumen.     Ears:     Comments: Ear exam was done after left ear was lavaged.  Hearing returned to normal after ear lavage    Nose: Nose normal.     Mouth/Throat:     Mouth: Mucous membranes are moist.  Skin:    General: Skin is warm and dry.  Neurological:     Mental Status: She is alert.            Assessment & Plan:    See Problem List for Assessment  and Plan of chronic medical problems.

## 2018-10-03 DIAGNOSIS — S63501A Unspecified sprain of right wrist, initial encounter: Secondary | ICD-10-CM | POA: Diagnosis not present

## 2018-10-03 DIAGNOSIS — G5601 Carpal tunnel syndrome, right upper limb: Secondary | ICD-10-CM | POA: Diagnosis not present

## 2018-10-03 DIAGNOSIS — S62001A Unspecified fracture of navicular [scaphoid] bone of right wrist, initial encounter for closed fracture: Secondary | ICD-10-CM | POA: Diagnosis not present

## 2018-10-15 ENCOUNTER — Other Ambulatory Visit: Payer: Self-pay

## 2018-10-15 DIAGNOSIS — R6889 Other general symptoms and signs: Secondary | ICD-10-CM | POA: Diagnosis not present

## 2018-10-15 DIAGNOSIS — Z20822 Contact with and (suspected) exposure to covid-19: Secondary | ICD-10-CM

## 2018-10-16 LAB — NOVEL CORONAVIRUS, NAA: SARS-CoV-2, NAA: NOT DETECTED

## 2018-11-05 NOTE — Progress Notes (Signed)
Virtual Visit via Video Note  I connected with Victoria Stone on 11/06/18 at 11:00 AM EDT by a video enabled telemedicine application and verified that I am speaking with the correct person using two identifiers.   I discussed the limitations of evaluation and management by telemedicine and the availability of in person appointments. The patient expressed understanding and agreed to proceed.  The patient is currently at home and I am in the office.    No referring provider.    History of Present Illness: She is here for follow up of her chronic medical conditions.   She is exercising regularly - walking.    Prediabetes:  She is compliant with a low sugar/carbohydrate diet.  She is exercising regularly.  GERD:  She is taking her medication daily as prescribed.  Has been having increased GERD symptoms, especially at night.  It does wake her up.  She is also had some coughing at night and it feels like she is almost aspirating.  She has been trying to not eat after 7.  Depression, Anxiety: She is taking her medication daily as prescribed. She denies any side effects from the medication. She feels her anxiety and depression are well controlled and she is happy with her current dose of medication.    Hyperlipidemia: She is taking her medication daily. She is compliant with a low fat/cholesterol diet. She denies myalgias.   Arthritis of knee:  She takes mobic three times a week.  She walks for exercise.       Review of Systems  Constitutional: Negative for chills and fever.  Respiratory: Negative for cough, shortness of breath and wheezing.   Cardiovascular: Negative for chest pain, palpitations and leg swelling.  Neurological: Negative for dizziness and headaches.       Social History   Socioeconomic History  . Marital status: Married    Spouse name: Not on file  . Number of children: 2  . Years of education: Not on file  . Highest education level: Not on file  Occupational  History  . Occupation: Programmer, multimedia: [employer]  . Financial resource strain: Not on file  . Food insecurity    Worry: Not on file    Inability: Not on file  . Transportation needs    Medical: Not on file    Non-medical: Not on file  Tobacco Use  . Smoking status: Never Smoker  . Smokeless tobacco: Never Used  . Tobacco comment: Married, 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 Alcohol use: No    Comment: occasionally  . Drug use: No  . Sexual activity: Not on file  Lifestyle  . Physical activity    Days per week: Not on file    Minutes per session: Not on file  . Stress: Not on file  Relationships  . Social Herbalist on phone: Not on file    Gets together: Not on file    Attends religious service: Not on file    Active member of club or organization: Not on file    Attends meetings of clubs or organizations: Not on file    Relationship status: Not on file  Other Topics Concern  . Not on file  Social History Narrative   RN at [employer]   Married, lives with spouse     Observations/Objective: Appears well in NAD   Assessment and Plan:  See Problem  List for Assessment and Plan of chronic medical problems.   Follow Up Instructions:    I discussed the assessment and treatment plan with the patient. The patient was provided an opportunity to ask questions and all were answered. The patient agreed with the plan and demonstrated an understanding of the instructions.   The patient was advised to call back or seek an in-person evaluation if the symptoms worsen or if the condition fails to improve as anticipated.  Fu in about 6 months  Binnie Rail, MD

## 2018-11-06 ENCOUNTER — Ambulatory Visit (INDEPENDENT_AMBULATORY_CARE_PROVIDER_SITE_OTHER): Payer: Medicare Other | Admitting: Internal Medicine

## 2018-11-06 ENCOUNTER — Encounter: Payer: Self-pay | Admitting: Internal Medicine

## 2018-11-06 DIAGNOSIS — K219 Gastro-esophageal reflux disease without esophagitis: Secondary | ICD-10-CM

## 2018-11-06 DIAGNOSIS — F4323 Adjustment disorder with mixed anxiety and depressed mood: Secondary | ICD-10-CM | POA: Diagnosis not present

## 2018-11-06 DIAGNOSIS — M1710 Unilateral primary osteoarthritis, unspecified knee: Secondary | ICD-10-CM

## 2018-11-06 DIAGNOSIS — R7303 Prediabetes: Secondary | ICD-10-CM

## 2018-11-06 DIAGNOSIS — E785 Hyperlipidemia, unspecified: Secondary | ICD-10-CM | POA: Diagnosis not present

## 2018-11-06 NOTE — Assessment & Plan Note (Signed)
Taking meloxicam every other day-it does help She is walking regularly Continue

## 2018-11-06 NOTE — Assessment & Plan Note (Signed)
Sugars have been well controlled We will defer blood work for now Continue regular exercise and healthy diet

## 2018-11-06 NOTE — Assessment & Plan Note (Signed)
Not ideally controlled She will continue to not eat after 7 PM Continue omeprazole 20 mg daily Advised adding Pepcid in the evening or at bedtime Call or message via my chart if GERD is not controlled

## 2018-11-06 NOTE — Assessment & Plan Note (Signed)
Depression and anxiety are well controlled Continue fluoxetine 20 mg daily

## 2018-11-06 NOTE — Assessment & Plan Note (Signed)
Cholesterol has been well controlled in the past We will defer blood work until 6 months from now Continue atorvastatin 20 mg daily Continue regular exercise and healthy diet

## 2018-11-14 ENCOUNTER — Ambulatory Visit: Payer: Medicare Other | Admitting: Cardiology

## 2018-11-16 DIAGNOSIS — H43811 Vitreous degeneration, right eye: Secondary | ICD-10-CM | POA: Diagnosis not present

## 2018-11-16 DIAGNOSIS — H5202 Hypermetropia, left eye: Secondary | ICD-10-CM | POA: Diagnosis not present

## 2018-11-16 DIAGNOSIS — H2513 Age-related nuclear cataract, bilateral: Secondary | ICD-10-CM | POA: Diagnosis not present

## 2018-11-16 DIAGNOSIS — H5213 Myopia, bilateral: Secondary | ICD-10-CM | POA: Diagnosis not present

## 2018-11-30 ENCOUNTER — Other Ambulatory Visit: Payer: Self-pay | Admitting: Internal Medicine

## 2018-12-05 DIAGNOSIS — S62001A Unspecified fracture of navicular [scaphoid] bone of right wrist, initial encounter for closed fracture: Secondary | ICD-10-CM | POA: Diagnosis not present

## 2018-12-05 DIAGNOSIS — G5603 Carpal tunnel syndrome, bilateral upper limbs: Secondary | ICD-10-CM | POA: Diagnosis not present

## 2018-12-05 DIAGNOSIS — S62024D Nondisplaced fracture of middle third of navicular [scaphoid] bone of right wrist, subsequent encounter for fracture with routine healing: Secondary | ICD-10-CM | POA: Diagnosis not present

## 2018-12-11 ENCOUNTER — Other Ambulatory Visit: Payer: Self-pay | Admitting: Orthopedic Surgery

## 2018-12-11 DIAGNOSIS — S62024D Nondisplaced fracture of middle third of navicular [scaphoid] bone of right wrist, subsequent encounter for fracture with routine healing: Secondary | ICD-10-CM

## 2018-12-14 ENCOUNTER — Ambulatory Visit (INDEPENDENT_AMBULATORY_CARE_PROVIDER_SITE_OTHER): Payer: Medicare Other

## 2018-12-14 ENCOUNTER — Other Ambulatory Visit: Payer: Self-pay

## 2018-12-14 DIAGNOSIS — Z23 Encounter for immunization: Secondary | ICD-10-CM | POA: Diagnosis not present

## 2018-12-17 ENCOUNTER — Ambulatory Visit
Admission: RE | Admit: 2018-12-17 | Discharge: 2018-12-17 | Disposition: A | Discharge status: Home / Self Care | Payer: Medicare Other | Source: Ambulatory Visit | Attending: Orthopedic Surgery | Admitting: Orthopedic Surgery

## 2018-12-17 ENCOUNTER — Other Ambulatory Visit: Payer: Self-pay

## 2018-12-17 DIAGNOSIS — R600 Localized edema: Secondary | ICD-10-CM | POA: Diagnosis not present

## 2018-12-17 DIAGNOSIS — S62024D Nondisplaced fracture of middle third of navicular [scaphoid] bone of right wrist, subsequent encounter for fracture with routine healing: Secondary | ICD-10-CM

## 2018-12-18 ENCOUNTER — Other Ambulatory Visit: Payer: Self-pay | Admitting: Orthopedic Surgery

## 2018-12-18 ENCOUNTER — Ambulatory Visit (INDEPENDENT_AMBULATORY_CARE_PROVIDER_SITE_OTHER): Payer: Medicare Other | Admitting: Cardiology

## 2018-12-18 ENCOUNTER — Encounter: Payer: Self-pay | Admitting: Cardiology

## 2018-12-18 ENCOUNTER — Other Ambulatory Visit: Payer: Self-pay

## 2018-12-18 VITALS — BP 116/76 | HR 68 | Ht 65.0 in | Wt 163.8 lb

## 2018-12-18 DIAGNOSIS — Z8249 Family history of ischemic heart disease and other diseases of the circulatory system: Secondary | ICD-10-CM | POA: Diagnosis not present

## 2018-12-18 DIAGNOSIS — S62024A Nondisplaced fracture of middle third of navicular [scaphoid] bone of right wrist, initial encounter for closed fracture: Secondary | ICD-10-CM

## 2018-12-18 DIAGNOSIS — E785 Hyperlipidemia, unspecified: Secondary | ICD-10-CM

## 2018-12-18 NOTE — Progress Notes (Addendum)
Cardiology Office Note:    Date:  12/18/2018   ID:  Victoria Stone, DOB 06/07/1950, MRN [id number]  PCP:  Binnie Rail, MD  Cardiologist:  No primary care provider on file.    Referring MD: Binnie Rail, MD   Chief Complaint  Patient presents with  . Follow-up    fm hx of CAD, HLD    History of Present Illness:    Victoria Stone is a 68 y.o. female with a hx of hyperlipidemia and a strong family history of coronary disease with her brother dying of a cardiac arrest in his late 15s and autopsy showed severe CAD and also a sister with PCI's and ultimately CABG at age 22.  She had a coronary calcium score done in 2010 which was 0 and a stress test in 2018 was normal. She is here today for followup and is doing well.  She denies any chest pain or pressure, SOB, DOE, PND, orthopnea, LE edema, dizziness, palpitations or syncope. She is compliant with her meds and is tolerating meds with no SE.  She is walking 40 minutes a few times weekly with no problems  Past Medical History:  Diagnosis Date  . ALLERGIC RHINITIS   . ARTHRITIS   . Arthritis of knee 11/20/2008  . Carpal tunnel syndrome on right 04/04/2017  . Chest pain 07/15/2015  . Dyslipidemia   . Family history of premature coronary artery disease 07/15/2015  . GERD (gastroesophageal reflux disease) 02/08/2016  . Obese   . Prediabetes 04/06/2017  . ULCERATIVE COLITIS   . Ulcerative colitis ([place]) 11/20/2008    Past Surgical History:  Procedure Laterality Date  . ABDOMINAL HYSTERECTOMY  2001  . BONE SPUR    . BREAST SURGERY  1988   Reduction  . FOOT SURGERY  90, 04, 09   X'S 3  . HAMMER TOE SURGERY    . LAPAROSCOPIC GASTRIC BANDING  2005  . TUBAL LIGATION  1988    Current Medications: Current Meds  Medication Sig  . atorvastatin (LIPITOR) 20 MG tablet TAKE 1 TABLET BY MOUTH EVERY DAY  . Calcium Carbonate (CALCIUM 600 PO) Take by mouth daily.  Marland Kitchen FLUoxetine (PROZAC) 20 MG capsule Take 1 capsule (20 mg total) by mouth daily.  .  meloxicam (MOBIC) 7.5 MG tablet Take 1 tablet (7.5 mg total) by mouth every other day.  . Multiple Vitamins-Minerals (MULTIVITAMIN) tablet Take 1 tablet by mouth daily.  . Omega-3 Fatty Acids (FISH OIL) 1200 MG CAPS Take by mouth daily.   Marland Kitchen omeprazole (PRILOSEC) 20 MG capsule Take 1 capsule (20 mg total) by mouth daily.  Marland Kitchen triamcinolone ointment (KENALOG) 0.5 % Apply 1 application topically 2 (two) times daily as needed (foot rash).     Allergies:   Penicillins   Social History   Socioeconomic History  . Marital status: Married    Spouse name: Not on file  . Number of children: 2  . Years of education: Not on file  . Highest education level: Not on file  Occupational History  . Occupation: Programmer, multimedia: [employer]  . Financial resource strain: Not on file  . Food insecurity    Worry: Not on file    Inability: Not on file  . Transportation needs    Medical: Not on file    Non-medical: Not on file  Tobacco Use  . Smoking status: Never Smoker  . Smokeless tobacco: Never Used  . Tobacco comment: Married,  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 Alcohol use: No    Comment: occasionally  . Drug use: No  . Sexual activity: Not on file  Lifestyle  . Physical activity    Days per week: Not on file    Minutes per session: Not on file  . Stress: Not on file  Relationships  . Social Herbalist on phone: Not on file    Gets together: Not on file    Attends religious service: Not on file    Active member of club or organization: Not on file    Attends meetings of clubs or organizations: Not on file    Relationship status: Not on file  Other Topics Concern  . Not on file  Social History Narrative   RN at [employer]   Married, lives with spouse     Family History: The patient's family history includes Breast cancer in her brother and sister; Diabetes in her sister; Heart attack in her father, mother, and  sister; Heart disease in her brother, father, mother, and sister; Hypertension in her father, maternal grandmother, mother, paternal grandfather, paternal grandmother, and sister; Lymphoma in her mother; Other in her mother.  ROS:   Please see the history of present illness.    ROS  All other systems reviewed and negative.   EKGs/Labs/Other Studies Reviewed:    The following studies were reviewed today: none  EKG:  EKG is  ordered today.  The ekg ordered today demonstrates NSR at 68bpm with no ST changes  Recent Labs: 05/08/2018: ALT 23; BUN 21; Creatinine, Ser 0.84; Hemoglobin 15.0; Platelets 270.0; Potassium 4.2; Sodium 141; TSH 2.48   Recent Lipid Panel    Component Value Date/Time   CHOL 147 05/08/2018 1047   CHOL 189 11/07/2014 0933   TRIG 106.0 05/08/2018 1047   TRIG 159 (H) 11/07/2014 0933   HDL 66.10 05/08/2018 1047   HDL 62 11/07/2014 0933   CHOLHDL 2 05/08/2018 1047   VLDL 21.2 05/08/2018 1047   LDLCALC 60 05/08/2018 1047   LDLCALC 95 11/07/2014 0933    Physical Exam:    VS:  BP 116/76   Pulse 68   Ht 5' 5"  (1.651 m)   Wt 163 lb 12.8 oz (74.3 kg)   BMI 27.26 kg/m     Wt Readings from Last 3 Encounters:  12/18/18 163 lb 12.8 oz (74.3 kg)  05/08/18 166 lb (75.3 kg)  10/31/17 151 lb (68.5 kg)     GEN:  Well nourished, well developed in no acute distress HEENT: Normal NECK: No JVD; No carotid bruits LYMPHATICS: No lymphadenopathy CARDIAC: RRR, no murmurs, rubs, gallops RESPIRATORY:  Clear to auscultation without rales, wheezing or rhonchi  ABDOMEN: Soft, non-tender, non-distended MUSCULOSKELETAL:  No edema; No deformity  SKIN: Warm and dry NEUROLOGIC:  Alert and oriented x 3 PSYCHIATRIC:  Normal affect   ASSESSMENT:    1. Family history of premature CAD   2. Dyslipidemia    PLAN:    In order of problems listed above:  1.  Family hx of premature CAD -she has had multiple family members who have had CAD diagnosed in their 24s.  -Calcium score  in 2010 and 2018 was 0 -stress test in 2018 normal -denies any anginal sx  2.  HLD -LDL 60 in Feb 2020 -continue on Atorvastatin 79m daily   Medication Adjustments/Labs and Tests Ordered: Current medicines are reviewed at length with the patient  today.  Concerns regarding medicines are outlined above.  Orders Placed This Encounter  Procedures  . EKG 12-Lead   No orders of the defined types were placed in this encounter.   Signed, Fransico Him, MD  12/18/2018 10:54 AM    [place]

## 2018-12-18 NOTE — Patient Instructions (Addendum)
Medication Instructions:   If you need a refill on your cardiac medications before your next appointment, please call your pharmacy.   Lab work:  If you have labs (blood work) drawn today and your tests are completely normal, you will receive your results only by: Marland Kitchen MyChart Message (if you have MyChart) OR . A paper copy in the mail If you have any lab test that is abnormal or we need to change your treatment, we will call you to review the results.  Testing/Procedures: None ordered today.  Follow-Up: At [facility], you and your health needs are our priority.  As part of our continuing mission to provide you with exceptional heart care, we have created designated Provider Care Teams.  These Care Teams include your primary Cardiologist (physician) and Advanced Practice Providers (APPs -  Physician Assistants and Nurse Practitioners) who all work together to provide you with the care you need, when you need it. You will need a follow up appointment in 12 months.  Please call our office 2 months in advance to schedule this appointment.  You may see Dr. Radford Pax or one of the following Advanced Practice Providers on your designated Care Team:   [place], PA-C Melina Copa, PA-C . Ermalinda Barrios, PA-C

## 2018-12-19 DIAGNOSIS — G5603 Carpal tunnel syndrome, bilateral upper limbs: Secondary | ICD-10-CM | POA: Diagnosis not present

## 2018-12-19 DIAGNOSIS — S62024D Nondisplaced fracture of middle third of navicular [scaphoid] bone of right wrist, subsequent encounter for fracture with routine healing: Secondary | ICD-10-CM | POA: Diagnosis not present

## 2018-12-20 ENCOUNTER — Other Ambulatory Visit: Payer: Self-pay | Admitting: Orthopedic Surgery

## 2018-12-24 ENCOUNTER — Encounter (HOSPITAL_BASED_OUTPATIENT_CLINIC_OR_DEPARTMENT_OTHER): Payer: Self-pay | Admitting: *Deleted

## 2018-12-24 ENCOUNTER — Other Ambulatory Visit: Payer: Self-pay

## 2018-12-24 NOTE — Progress Notes (Signed)

## 2018-12-27 ENCOUNTER — Other Ambulatory Visit (HOSPITAL_COMMUNITY)
Admission: RE | Admit: 2018-12-27 | Discharge: 2018-12-27 | Disposition: A | Discharge status: Home / Self Care | Payer: Medicare Other | Source: Ambulatory Visit | Attending: Orthopedic Surgery | Admitting: Orthopedic Surgery

## 2018-12-27 DIAGNOSIS — Z01812 Encounter for preprocedural laboratory examination: Secondary | ICD-10-CM | POA: Diagnosis not present

## 2018-12-27 DIAGNOSIS — Z20828 Contact with and (suspected) exposure to other viral communicable diseases: Secondary | ICD-10-CM | POA: Diagnosis not present

## 2018-12-29 LAB — NOVEL CORONAVIRUS, NAA (HOSP ORDER, SEND-OUT TO REF LAB; TAT 18-24 HRS): SARS-CoV-2, NAA: NOT DETECTED

## 2018-12-31 ENCOUNTER — Other Ambulatory Visit: Payer: Self-pay

## 2018-12-31 ENCOUNTER — Encounter (HOSPITAL_BASED_OUTPATIENT_CLINIC_OR_DEPARTMENT_OTHER)
Admission: RE | Disposition: A | Discharge status: Home / Self Care | Payer: Self-pay | Source: Home / Self Care | Attending: Orthopedic Surgery

## 2018-12-31 ENCOUNTER — Ambulatory Visit (HOSPITAL_BASED_OUTPATIENT_CLINIC_OR_DEPARTMENT_OTHER)
Admission: RE | Admit: 2018-12-31 | Discharge: 2018-12-31 | Disposition: A | Discharge status: Home / Self Care | Payer: Medicare Other | Attending: Orthopedic Surgery | Admitting: Orthopedic Surgery

## 2018-12-31 ENCOUNTER — Encounter (HOSPITAL_BASED_OUTPATIENT_CLINIC_OR_DEPARTMENT_OTHER): Payer: Self-pay | Admitting: *Deleted

## 2018-12-31 ENCOUNTER — Ambulatory Visit (HOSPITAL_BASED_OUTPATIENT_CLINIC_OR_DEPARTMENT_OTHER): Payer: Medicare Other | Admitting: Anesthesiology

## 2018-12-31 DIAGNOSIS — Z791 Long term (current) use of non-steroidal anti-inflammatories (NSAID): Secondary | ICD-10-CM | POA: Diagnosis not present

## 2018-12-31 DIAGNOSIS — K219 Gastro-esophageal reflux disease without esophagitis: Secondary | ICD-10-CM | POA: Insufficient documentation

## 2018-12-31 DIAGNOSIS — Z79899 Other long term (current) drug therapy: Secondary | ICD-10-CM | POA: Insufficient documentation

## 2018-12-31 DIAGNOSIS — R7303 Prediabetes: Secondary | ICD-10-CM | POA: Diagnosis not present

## 2018-12-31 DIAGNOSIS — K5190 Ulcerative colitis, unspecified, without complications: Secondary | ICD-10-CM | POA: Diagnosis not present

## 2018-12-31 DIAGNOSIS — E669 Obesity, unspecified: Secondary | ICD-10-CM | POA: Diagnosis not present

## 2018-12-31 DIAGNOSIS — G5601 Carpal tunnel syndrome, right upper limb: Secondary | ICD-10-CM | POA: Diagnosis not present

## 2018-12-31 DIAGNOSIS — Z880 Allergy status to penicillin: Secondary | ICD-10-CM | POA: Insufficient documentation

## 2018-12-31 DIAGNOSIS — E785 Hyperlipidemia, unspecified: Secondary | ICD-10-CM | POA: Insufficient documentation

## 2018-12-31 DIAGNOSIS — Z6826 Body mass index (BMI) 26.0-26.9, adult: Secondary | ICD-10-CM | POA: Insufficient documentation

## 2018-12-31 DIAGNOSIS — J309 Allergic rhinitis, unspecified: Secondary | ICD-10-CM | POA: Diagnosis not present

## 2018-12-31 DIAGNOSIS — F4323 Adjustment disorder with mixed anxiety and depressed mood: Secondary | ICD-10-CM | POA: Diagnosis not present

## 2018-12-31 DIAGNOSIS — M1710 Unilateral primary osteoarthritis, unspecified knee: Secondary | ICD-10-CM | POA: Diagnosis not present

## 2018-12-31 HISTORY — PX: CARPAL TUNNEL RELEASE: SHX101

## 2018-12-31 SURGERY — CARPAL TUNNEL RELEASE
Anesthesia: Regional | Site: Wrist | Laterality: Right

## 2018-12-31 MED ORDER — LIDOCAINE HCL (PF) 0.5 % IJ SOLN
INTRAMUSCULAR | Status: DC | PRN
  Administered 2018-12-31: 30 mL via INTRAVENOUS

## 2018-12-31 MED ORDER — SUCCINYLCHOLINE CHLORIDE 200 MG/10ML IV SOSY
PREFILLED_SYRINGE | INTRAVENOUS | Status: AC
  Filled 2018-12-31: qty 10

## 2018-12-31 MED ORDER — ONDANSETRON HCL 4 MG/2ML IJ SOLN: 4.0000 mg | Freq: Once | INTRAMUSCULAR | Status: DC | PRN

## 2018-12-31 MED ORDER — MIDAZOLAM HCL 2 MG/2ML IJ SOLN
INTRAMUSCULAR | Status: AC
  Filled 2018-12-31: qty 2

## 2018-12-31 MED ORDER — PROPOFOL 10 MG/ML IV BOLUS
INTRAVENOUS | Status: DC | PRN
  Administered 2018-12-31 (×2): 10 mg via INTRAVENOUS

## 2018-12-31 MED ORDER — ACETAMINOPHEN 325 MG PO TABS: 325.0000 mg | ORAL_TABLET | ORAL | Status: DC | PRN

## 2018-12-31 MED ORDER — OXYCODONE HCL 5 MG PO TABS: 5.0000 mg | ORAL_TABLET | Freq: Once | ORAL | Status: DC | PRN

## 2018-12-31 MED ORDER — MIDAZOLAM HCL 2 MG/2ML IJ SOLN
1.0000 mg | INTRAMUSCULAR | Status: DC | PRN
  Administered 2018-12-31: 2 mg via INTRAVENOUS

## 2018-12-31 MED ORDER — ONDANSETRON HCL 4 MG/2ML IJ SOLN
INTRAMUSCULAR | Status: DC | PRN
  Administered 2018-12-31: 4 mg via INTRAVENOUS

## 2018-12-31 MED ORDER — VANCOMYCIN HCL IN DEXTROSE 1-5 GM/200ML-% IV SOLN
1000.0000 mg | INTRAVENOUS | Status: AC
  Administered 2018-12-31: 1000 mg via INTRAVENOUS

## 2018-12-31 MED ORDER — FENTANYL CITRATE (PF) 100 MCG/2ML IJ SOLN: 25.0000 ug | INTRAMUSCULAR | Status: DC | PRN

## 2018-12-31 MED ORDER — HYDROCODONE-ACETAMINOPHEN 5-325 MG PO TABS: ORAL_TABLET | ORAL | 0 refills | Status: DC

## 2018-12-31 MED ORDER — MEPERIDINE HCL 25 MG/ML IJ SOLN: 6.2500 mg | INTRAMUSCULAR | Status: DC | PRN

## 2018-12-31 MED ORDER — EPHEDRINE 5 MG/ML INJ
INTRAVENOUS | Status: AC
  Filled 2018-12-31: qty 10

## 2018-12-31 MED ORDER — PHENYLEPHRINE 40 MCG/ML (10ML) SYRINGE FOR IV PUSH (FOR BLOOD PRESSURE SUPPORT)
PREFILLED_SYRINGE | INTRAVENOUS | Status: AC
  Filled 2018-12-31: qty 10

## 2018-12-31 MED ORDER — VANCOMYCIN HCL IN DEXTROSE 1-5 GM/200ML-% IV SOLN
INTRAVENOUS | Status: AC
  Filled 2018-12-31: qty 200

## 2018-12-31 MED ORDER — CHLORHEXIDINE GLUCONATE 4 % EX LIQD: 60.0000 mL | Freq: Once | CUTANEOUS | Status: DC

## 2018-12-31 MED ORDER — FENTANYL CITRATE (PF) 100 MCG/2ML IJ SOLN
50.0000 ug | INTRAMUSCULAR | Status: DC | PRN
  Administered 2018-12-31 (×2): 50 ug via INTRAVENOUS

## 2018-12-31 MED ORDER — FENTANYL CITRATE (PF) 100 MCG/2ML IJ SOLN
INTRAMUSCULAR | Status: AC
  Filled 2018-12-31: qty 2

## 2018-12-31 MED ORDER — LACTATED RINGERS IV SOLN
INTRAVENOUS | Status: DC
  Administered 2018-12-31: 12:00:00 via INTRAVENOUS

## 2018-12-31 MED ORDER — ACETAMINOPHEN 500 MG PO TABS: 1000.0000 mg | ORAL_TABLET | Freq: Once | ORAL | Status: DC

## 2018-12-31 MED ORDER — LIDOCAINE 2% (20 MG/ML) 5 ML SYRINGE
INTRAMUSCULAR | Status: AC
  Filled 2018-12-31: qty 5

## 2018-12-31 MED ORDER — ACETAMINOPHEN 160 MG/5ML PO SOLN: 325.0000 mg | ORAL | Status: DC | PRN

## 2018-12-31 MED ORDER — CEFAZOLIN SODIUM-DEXTROSE 2-3 GM-%(50ML) IV SOLR
INTRAVENOUS | Status: DC | PRN
  Administered 2018-12-31: 2 g via INTRAVENOUS

## 2018-12-31 MED ORDER — PROPOFOL 500 MG/50ML IV EMUL
INTRAVENOUS | Status: DC | PRN
  Administered 2018-12-31: 50 ug/kg/min via INTRAVENOUS

## 2018-12-31 MED ORDER — ONDANSETRON HCL 4 MG/2ML IJ SOLN
INTRAMUSCULAR | Status: AC
  Filled 2018-12-31: qty 2

## 2018-12-31 MED ORDER — BUPIVACAINE HCL (PF) 0.25 % IJ SOLN
INTRAMUSCULAR | Status: DC | PRN
  Administered 2018-12-31: 10 mL

## 2018-12-31 MED ORDER — OXYCODONE HCL 5 MG/5ML PO SOLN: 5.0000 mg | Freq: Once | ORAL | Status: DC | PRN

## 2018-12-31 SURGICAL SUPPLY — 34 items
BLADE SURG 15 STRL LF DISP TIS (BLADE) ×2 IMPLANT
BLADE SURG 15 STRL SS (BLADE) ×4
BNDG ELASTIC 3X5.8 VLCR STR LF (GAUZE/BANDAGES/DRESSINGS) ×3 IMPLANT
BNDG ESMARK 4X9 LF (GAUZE/BANDAGES/DRESSINGS) IMPLANT
BNDG GAUZE ELAST 4 BULKY (GAUZE/BANDAGES/DRESSINGS) ×3 IMPLANT
CHLORAPREP W/TINT 26 (MISCELLANEOUS) ×3 IMPLANT
CORD BIPOLAR FORCEPS 12FT (ELECTRODE) ×3 IMPLANT
COVER BACK TABLE REUSABLE LG (DRAPES) ×3 IMPLANT
COVER MAYO STAND REUSABLE (DRAPES) ×3 IMPLANT
COVER WAND RF STERILE (DRAPES) IMPLANT
CUFF TOURN SGL QUICK 18X4 (TOURNIQUET CUFF) ×3 IMPLANT
DRAPE EXTREMITY T 121X128X90 (DISPOSABLE) ×3 IMPLANT
DRAPE SURG 17X23 STRL (DRAPES) ×3 IMPLANT
DRSG PAD ABDOMINAL 8X10 ST (GAUZE/BANDAGES/DRESSINGS) ×3 IMPLANT
GAUZE SPONGE 4X4 12PLY STRL (GAUZE/BANDAGES/DRESSINGS) ×3 IMPLANT
GAUZE XEROFORM 1X8 LF (GAUZE/BANDAGES/DRESSINGS) ×3 IMPLANT
GLOVE BIO SURGEON STRL SZ7.5 (GLOVE) ×3 IMPLANT
GLOVE BIOGEL PI IND STRL 8 (GLOVE) ×1 IMPLANT
GLOVE BIOGEL PI INDICATOR 8 (GLOVE) ×2
GLOVE SURG SS PI 6.5 STRL IVOR (GLOVE) ×3 IMPLANT
GOWN STRL REUS W/ TWL LRG LVL3 (GOWN DISPOSABLE) ×2 IMPLANT
GOWN STRL REUS W/TWL LRG LVL3 (GOWN DISPOSABLE) ×4
GOWN STRL REUS W/TWL XL LVL3 (GOWN DISPOSABLE) ×3 IMPLANT
NEEDLE HYPO 25X1 1.5 SAFETY (NEEDLE) ×3 IMPLANT
NS IRRIG 1000ML POUR BTL (IV SOLUTION) ×3 IMPLANT
PACK BASIN DAY SURGERY FS (CUSTOM PROCEDURE TRAY) ×3 IMPLANT
PADDING CAST ABS 4INX4YD NS (CAST SUPPLIES) ×2
PADDING CAST ABS COTTON 4X4 ST (CAST SUPPLIES) ×1 IMPLANT
STOCKINETTE 4X48 STRL (DRAPES) ×3 IMPLANT
SUT ETHILON 4 0 PS 2 18 (SUTURE) ×3 IMPLANT
SYR BULB 3OZ (MISCELLANEOUS) ×3 IMPLANT
SYR CONTROL 10ML LL (SYRINGE) ×3 IMPLANT
TOWEL GREEN STERILE FF (TOWEL DISPOSABLE) ×6 IMPLANT
UNDERPAD 30X36 HEAVY ABSORB (UNDERPADS AND DIAPERS) ×3 IMPLANT

## 2018-12-31 NOTE — H&P (Signed)
Victoria Stone is an 69 y.o. female.   Chief Complaint: right carpal tunnel syndrome HPI: 68 yo female with numbness and tingling right hand.  Positive nerve conduction studies.  She wishes to have a carpal tunnel release.  Allergies:  Allergies  Allergen Reactions  . Penicillins Hives and Rash    Past Medical History:  Diagnosis Date  . ALLERGIC RHINITIS   . ARTHRITIS   . Arthritis of knee 11/20/2008  . Carpal tunnel syndrome on right 04/04/2017  . Chest pain 07/15/2015  . Dyslipidemia   . Family history of premature coronary artery disease 07/15/2015  . GERD (gastroesophageal reflux disease) 02/08/2016  . Obese   . Prediabetes 04/06/2017  . ULCERATIVE COLITIS   . Ulcerative colitis ([place]) 11/20/2008    Past Surgical History:  Procedure Laterality Date  . ABDOMINAL HYSTERECTOMY  2001  . BONE SPUR    . BREAST SURGERY  1988   Reduction  . FOOT SURGERY  90, 04, 09   X'S 3  . HAMMER TOE SURGERY    . LAPAROSCOPIC GASTRIC BANDING  2005  . TUBAL LIGATION  1988    Family History: Family History  Problem Relation Age of Onset  . Hypertension Mother   . Lymphoma Mother   . Other Mother        PACEMAKER  . Heart disease Mother   . Heart attack Mother   . Hypertension Father   . Heart disease Father   . Heart attack Father   . Hypertension Sister   . Diabetes Sister   . Heart attack Sister   . Heart disease Sister   . Heart disease Brother   . Breast cancer Brother   . Breast cancer Sister   . Hypertension Maternal Grandmother   . Hypertension Paternal Grandmother   . Hypertension Paternal Grandfather     Social History:   reports that she has never smoked. She has never used smokeless tobacco. She reports that she does not drink alcohol or use drugs.  Medications: Medications Prior to Admission  Medication Sig Dispense Refill  . atorvastatin (LIPITOR) 20 MG tablet TAKE 1 TABLET BY MOUTH EVERY DAY 90 tablet 1  . Calcium Carbonate (CALCIUM 600 PO) Take by mouth daily.     Marland Kitchen FLUoxetine (PROZAC) 20 MG capsule Take 1 capsule (20 mg total) by mouth daily. 90 capsule 3  . meloxicam (MOBIC) 7.5 MG tablet Take 1 tablet (7.5 mg total) by mouth every other day. 90 tablet 0  . Multiple Vitamins-Minerals (MULTIVITAMIN) tablet Take 1 tablet by mouth daily.    . Omega-3 Fatty Acids (FISH OIL) 1200 MG CAPS Take by mouth daily.     Marland Kitchen omeprazole (PRILOSEC) 20 MG capsule Take 1 capsule (20 mg total) by mouth daily. 90 capsule 3  . triamcinolone ointment (KENALOG) 0.5 % Apply 1 application topically 2 (two) times daily as needed (foot rash). 30 g 1    No results found for this or any previous visit (from the past 48 hour(s)).  No results found.   A comprehensive review of systems was negative.  Blood pressure (!) 119/59, pulse 65, temperature 97.7 F (36.5 C), temperature source Oral, resp. rate 16, height 5' 5"  (1.651 m), weight 72.2 kg, SpO2 99 %.  General appearance: alert, cooperative and appears stated age Head: Normocephalic, without obvious abnormality, atraumatic Neck: supple, symmetrical, trachea midline Cardio: regular rate and rhythm Resp: clear to auscultation bilaterally Extremities: Intact sensation and capillary refill all digits.  +epl/fpl/io.  No  wounds.  Pulses: 2+ and symmetric Skin: Skin color, texture, turgor normal. No rashes or lesions Neurologic: Grossly normal Incision/Wound: none  Assessment/Plan Right carpal tunnel syndrome.  Non operative and operative treatment options have been discussed with the patient and patient wishes to proceed with operative treatment. Risks, benefits, and alternatives of surgery have been discussed and the patient agrees with the plan of care.   Leanora Cover 12/31/2018, 12:33 PM

## 2018-12-31 NOTE — Anesthesia Procedure Notes (Signed)
Anesthesia Regional Block: Bier block (IV Regional)   Pre-Anesthetic Checklist: ,, timeout performed, Correct Patient, Correct Site, Correct Laterality, Correct Procedure,, site marked, surgical consent,, at surgeon's request  Laterality: Right     Needles:  Injection technique: Single-shot  Needle Type: Other      Needle Gauge: 22     Additional Needles:   Procedures:,,,,, intact distal pulses, Esmarch exsanguination, single tourniquet utilized,  Narrative:  Start time: 12/31/2018 2:32 PM End time: 12/31/2018 2:32 PM  Performed by: Personally

## 2018-12-31 NOTE — Anesthesia Postprocedure Evaluation (Signed)
Anesthesia Post Note  Patient: Victoria Stone  Procedure(s) Performed: RIGHT CARPAL TUNNEL RELEASE (Right Wrist)     Patient location during evaluation: PACU Anesthesia Type: Bier Block Level of consciousness: awake and alert Pain management: pain level controlled Vital Signs Assessment: post-procedure vital signs reviewed and stable Respiratory status: spontaneous breathing, nonlabored ventilation, respiratory function stable and patient connected to nasal cannula oxygen Cardiovascular status: stable and blood pressure returned to baseline Postop Assessment: no apparent nausea or vomiting Anesthetic complications: no    Last Vitals:  Vitals:   12/31/18 1506 12/31/18 1515  BP: 128/64 133/76  Pulse: (!) 54 (!) 54  Resp: (!) 7 10  Temp: 36.5 C   SpO2: 100% 95%    Last Pain:  Vitals:   12/31/18 1515  TempSrc:   PainSc: 0-No pain                 Maritta Kief

## 2018-12-31 NOTE — Discharge Instructions (Addendum)

## 2018-12-31 NOTE — Op Note (Signed)
12/31/2018 [facility]                              OPERATIVE REPORT   PREOPERATIVE DIAGNOSIS:  Right carpal tunnel syndrome.  POSTOPERATIVE DIAGNOSIS:  Right carpal tunnel syndrome.  PROCEDURE:  Right carpal tunnel release.  SURGEON:  Leanora Cover, MD  ASSISTANT:  none.  ANESTHESIA: Bier block with sedation  IV FLUIDS:  Per anesthesia flow sheet.  ESTIMATED BLOOD LOSS:  Minimal.  COMPLICATIONS:  None.  SPECIMENS:  None.  TOURNIQUET TIME:    Total Tourniquet Time Documented: Forearm (Right) - 27 minutes Total: Forearm (Right) - 27 minutes   DISPOSITION:  Stable to PACU.  LOCATION:  SURGERY CENTER  INDICATIONS: 68 year old female with decreased sensation in the right hand.  Positive nerve conduction studies. She wishes to have a carpal tunnel release for management of her symptoms.  Risks, benefits and alternatives of surgery were discussed including the risk of blood loss; infection; damage to nerves, vessels, tendons, ligaments, bone; failure of surgery; need for additional surgery; complications with wound healing; continued pain; recurrence of carpal tunnel syndrome; and damage to motor branch. She voiced understanding of these risks and elected to proceed.   OPERATIVE COURSE:  After being identified preoperatively by myself, the patient and I agreed upon the procedure and site of procedure.  The surgical site was marked.  The risks, benefits, and alternatives of the surgery were reviewed and she wished to proceed.  Surgical consent had been signed.  She was given IV antibiotics as preoperative antibiotic prophylaxis.  She was transferred to the operating room and placed on the operating room table in supine position with the Right upper extremity on an armboard.  Bier block anesthesia was induced by the anesthesiologist.  Care was taken during the exsanguination to not wrap at the wrist to protect the healing scaphoid fracture.  Right upper extremity  was prepped and draped in normal sterile orthopaedic fashion.  A surgical pause was performed between the surgeons, anesthesia, and operating room staff, and all were in agreement as to the patient, procedure, and site of procedure.  Tourniquet at the proximal aspect of the forearm had been inflated for the Bier block  Incision was made over the transverse carpal ligament and carried into the subcutaneous tissues by spreading technique.  Bipolar electrocautery was used to obtain hemostasis.  The palmar fascia was sharply incised.  The transverse carpal ligament was identified and sharply incised.  It was incised distally first.  Care was taken to ensure complete decompression distally.  It was then incised proximally.  Scissors were used to split the distal aspect of the volar antebrachial fascia.  A finger was placed into the wound to ensure complete decompression, which was the case.  The nerve was examined.  It was flattened and hyperemic.  The motor branch was identified and was intact.  The wound was copiously irrigated with sterile saline.  It was then closed with 4-0 nylon in a horizontal mattress fashion.  It was injected with 0.25% plain Marcaine to aid in postoperative analgesia.  It was dressed with sterile Xeroform, 4x4s, and wrapped with Kerlix bandage.  A thumb spica splint was placed and wrapped with Kerlix and an Ace bandage.  Tourniquet was deflated at 27 minutes.  Fingertips were pink with brisk capillary refill after deflation of the tourniquet.  Operative drapes were broken down.  The patient was awoken from anesthesia  safely.  She was transferred back to stretcher and taken to the PACU in stable condition.  I will see her back in the office in 1 week for postoperative followup.  I will give her a prescription for Norco 5/325 1-2 tabs PO q6 hours prn pain, dispense # 20.    Leanora Cover, MD Electronically signed, 12/31/18

## 2018-12-31 NOTE — Transfer of Care (Signed)
Immediate Anesthesia Transfer of Care Note  Patient: Victoria Stone  Procedure(s) Performed: RIGHT CARPAL TUNNEL RELEASE (Right Wrist)  Patient Location: PACU  Anesthesia Type:MAC and Bier block  Level of Consciousness: awake, alert  and oriented  Airway & Oxygen Therapy: Patient Spontanous Breathing  Post-op Assessment: Report given to RN and Post -op Vital signs reviewed and stable  Post vital signs: Reviewed and stable  Last Vitals:  Vitals Value Taken Time  BP    Temp    Pulse 54 12/31/18 1506  Resp 7 12/31/18 1506  SpO2 100 % 12/31/18 1506  Vitals shown include unvalidated device data.  Last Pain:  Vitals:   12/31/18 1132  TempSrc: Oral  PainSc: 0-No pain      Patients Stated Pain Goal: 3 (81/18/86 7737)  Complications: No apparent anesthesia complications

## 2018-12-31 NOTE — Anesthesia Preprocedure Evaluation (Signed)
Anesthesia Evaluation  Patient identified by MRN, date of birth, ID band Patient awake    Reviewed: Allergy & Precautions, NPO status , Patient's Chart, lab work & pertinent test results  Airway Mallampati: II  TM Distance: >3 FB Neck ROM: Full    Dental  (+) Teeth Intact, Dental Advisory Given   Pulmonary neg pulmonary ROS,    Pulmonary exam normal breath sounds clear to auscultation       Cardiovascular negative cardio ROS Normal cardiovascular exam Rhythm:Regular Rate:Normal     Neuro/Psych PSYCHIATRIC DISORDERS Anxiety Depression negative neurological ROS     GI/Hepatic Neg liver ROS, PUD, GERD  Medicated,UC   Endo/Other  negative endocrine ROS  Renal/GU negative Renal ROS     Musculoskeletal  (+) Arthritis , RIGHT CARPAL TUNNEL SYNDROME   Abdominal   Peds  Hematology negative hematology ROS (+)   Anesthesia Other Findings Day of surgery medications reviewed with the patient.  Reproductive/Obstetrics                             Anesthesia Physical Anesthesia Plan  ASA: II  Anesthesia Plan: Bier Block and Bier Block-LIDOCAINE ONLY   Post-op Pain Management:    Induction: Intravenous  PONV Risk Score and Plan: 2 and Propofol infusion and Treatment may vary due to age or medical condition  Airway Management Planned: Nasal Cannula and Natural Airway  Additional Equipment:   Intra-op Plan:   Post-operative Plan:   Informed Consent: I have reviewed the patients History and Physical, chart, labs and discussed the procedure including the risks, benefits and alternatives for the proposed anesthesia with the patient or authorized representative who has indicated his/her understanding and acceptance.     Dental advisory given  Plan Discussed with: CRNA and Anesthesiologist  Anesthesia Plan Comments:         Anesthesia Quick Evaluation

## 2018-12-31 NOTE — Addendum Note (Signed)
Addendum  created 12/31/18 1546 by Maryella Shivers, CRNA   Intraprocedure Meds edited

## 2019-01-02 ENCOUNTER — Encounter (HOSPITAL_BASED_OUTPATIENT_CLINIC_OR_DEPARTMENT_OTHER): Payer: Self-pay | Admitting: Orthopedic Surgery

## 2019-01-09 DIAGNOSIS — C44519 Basal cell carcinoma of skin of other part of trunk: Secondary | ICD-10-CM | POA: Diagnosis not present

## 2019-01-09 DIAGNOSIS — D485 Neoplasm of uncertain behavior of skin: Secondary | ICD-10-CM | POA: Diagnosis not present

## 2019-01-09 DIAGNOSIS — L570 Actinic keratosis: Secondary | ICD-10-CM | POA: Diagnosis not present

## 2019-01-09 DIAGNOSIS — L821 Other seborrheic keratosis: Secondary | ICD-10-CM | POA: Diagnosis not present

## 2019-01-24 ENCOUNTER — Encounter: Payer: Self-pay | Admitting: Internal Medicine

## 2019-01-24 DIAGNOSIS — Z1159 Encounter for screening for other viral diseases: Secondary | ICD-10-CM

## 2019-01-28 ENCOUNTER — Other Ambulatory Visit: Payer: Self-pay

## 2019-01-28 DIAGNOSIS — Z20828 Contact with and (suspected) exposure to other viral communicable diseases: Secondary | ICD-10-CM | POA: Diagnosis not present

## 2019-01-28 DIAGNOSIS — Z20822 Contact with and (suspected) exposure to covid-19: Secondary | ICD-10-CM

## 2019-01-30 LAB — NOVEL CORONAVIRUS, NAA: SARS-CoV-2, NAA: NOT DETECTED

## 2019-02-11 DIAGNOSIS — S62024D Nondisplaced fracture of middle third of navicular [scaphoid] bone of right wrist, subsequent encounter for fracture with routine healing: Secondary | ICD-10-CM | POA: Diagnosis not present

## 2019-04-03 ENCOUNTER — Ambulatory Visit: Payer: Medicare Other | Attending: Internal Medicine

## 2019-04-03 DIAGNOSIS — Z23 Encounter for immunization: Secondary | ICD-10-CM | POA: Diagnosis not present

## 2019-04-03 NOTE — Progress Notes (Signed)
   Covid-19 Vaccination Clinic  Name:  Victoria Stone    MRN: [id number] DOB: 03/07/1951  04/03/2019  [name] was observed post Covid-19 immunization for 15 minutes without incidence. She was provided with Vaccine Information Sheet and instruction to access the V-Safe system.   [name] was instructed to call 911 with any severe reactions post vaccine: Marland Kitchen Difficulty breathing  . Swelling of your face and throat  . A fast heartbeat  . A bad rash all over your body  . Dizziness and weakness    Immunizations Administered    Name Date Dose VIS Date Route   Pfizer COVID-19 Vaccine 04/03/2019  3:14 PM 0.3 mL 02/22/2019 Intramuscular   Manufacturer: [place]   Lot: [id number]   [place]: [address]-7

## 2019-04-05 ENCOUNTER — Telehealth: Payer: Self-pay | Admitting: Internal Medicine

## 2019-04-05 NOTE — Telephone Encounter (Signed)
Done and set up front. Pt aware

## 2019-04-05 NOTE — Telephone Encounter (Signed)
Patient is requesting a copy of her vaccinations.

## 2019-04-24 ENCOUNTER — Ambulatory Visit: Payer: Medicare Other | Attending: Internal Medicine

## 2019-04-24 DIAGNOSIS — Z23 Encounter for immunization: Secondary | ICD-10-CM | POA: Insufficient documentation

## 2019-04-24 NOTE — Progress Notes (Signed)
   Covid-19 Vaccination Clinic  Name:  ANJELIQUE MAKAR    MRN: [id number] DOB: Feb 18, 1951  04/24/2019  Ms. Gudino was observed post Covid-19 immunization for 15 minutes without incidence. She was provided with Vaccine Information Sheet and instruction to access the V-Safe system.   Ms. Boehm was instructed to call 911 with any severe reactions post vaccine: Marland Kitchen Difficulty breathing  . Swelling of your face and throat  . A fast heartbeat  . A bad rash all over your body  . Dizziness and weakness    Immunizations Administered    Name Date Dose VIS Date Route   Pfizer COVID-19 Vaccine 04/24/2019  9:01 AM 0.3 mL 02/22/2019 Intramuscular   Manufacturer: [place]   Lot: [id number]   [place]: [id number]

## 2019-05-06 NOTE — Patient Instructions (Addendum)
  Blood work was ordered.     Medications reviewed and updated.  Changes include :   Try trazodone for your sleep.  Update via mychart.  Your prescription(s) have been submitted to your pharmacy. Please take as directed and contact our office if you believe you are having problem(s) with the medication(s).   Please followup in 6 months

## 2019-05-06 NOTE — Progress Notes (Signed)
Subjective:    Patient ID: Victoria Stone, female    DOB: Jul 02, 1950, 69 y.o.   MRN: [id number]  HPI The patient is here for follow up of their chronic medical problems, including prediabetes, GERD, depression, anxiety, hyperlipidemia and arthritis of her knee.    She is taking all of her medications as prescribed.   She is exercising regularly - walking.      Taking omeprazole daily and pepcid 20 mg at night.  She feels GERD 2-3 times a week.  She is compliant with a GERD diet.   She would like to come off of the omeprazole when possible.  Difficulty sleeping: She is experiencing difficulty falling asleep.  Once she gets to sleep she is okay.  She has tried melatonin and it does not help that much.  She wondered about a prescription medication for sleep.  Medications and allergies reviewed with patient and updated if appropriate.  Patient Active Problem List   Diagnosis Date Noted  . Prediabetes 04/06/2017  . Carpal tunnel syndrome on right 04/04/2017  . Family history of premature CAD 07/20/2016  . GERD (gastroesophageal reflux disease) 02/08/2016  . Chest pain 07/15/2015  . Dyslipidemia   . Adjustment disorder with mixed anxiety and depressed mood 11/20/2008  . ALLERGIC RHINITIS 11/20/2008  . Ulcerative colitis ([place]) 11/20/2008  . Arthritis of knee 11/20/2008    Current Outpatient Medications on File Prior to Visit  Medication Sig Dispense Refill  . Calcium Carbonate (CALCIUM 600 PO) Take by mouth daily.    . meloxicam (MOBIC) 7.5 MG tablet Take 1 tablet (7.5 mg total) by mouth every other day. 90 tablet 0  . Multiple Vitamins-Minerals (MULTIVITAMIN) tablet Take 1 tablet by mouth daily.    . Omega-3 Fatty Acids (FISH OIL) 1200 MG CAPS Take by mouth daily.     Marland Kitchen triamcinolone ointment (KENALOG) 0.5 % Apply 1 application topically 2 (two) times daily as needed (foot rash). 30 g 1   No current facility-administered medications on file prior to visit.    Past Medical  History:  Diagnosis Date  . ALLERGIC RHINITIS   . ARTHRITIS   . Arthritis of knee 11/20/2008  . Carpal tunnel syndrome on right 04/04/2017  . Chest pain 07/15/2015  . Dyslipidemia   . Family history of premature coronary artery disease 07/15/2015  . GERD (gastroesophageal reflux disease) 02/08/2016  . Obese   . Prediabetes 04/06/2017  . ULCERATIVE COLITIS   . Ulcerative colitis ([place]) 11/20/2008    Past Surgical History:  Procedure Laterality Date  . ABDOMINAL HYSTERECTOMY  2001  . BONE SPUR    . BREAST SURGERY  1988   Reduction  . CARPAL TUNNEL RELEASE Right 12/31/2018   Procedure: RIGHT CARPAL TUNNEL RELEASE;  Surgeon: Leanora Cover, MD;  Location: [facility];  Service: Orthopedics;  Laterality: Right;  . FOOT SURGERY  90, 04, 09   X'S 3  . HAMMER TOE SURGERY    . LAPAROSCOPIC GASTRIC BANDING  2005  . TUBAL LIGATION  1988    Social History   Socioeconomic History  . Marital status: Married    Spouse name: Not on file  . Number of children: 2  . Years of education: Not on file  . Highest education level: Not on file  Occupational History  . Occupation: Programmer, multimedia: [employer]  Tobacco Use  . Smoking status: Never Smoker  . Smokeless tobacco: Never Used  . Tobacco comment: Married, lives with  spouse. 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 Alcohol use: No    Comment: occasionally  . Drug use: No  . Sexual activity: Not on file  Other Topics Concern  . Not on file  Social History Narrative   RN at [employer]   Married, lives with spouse   Social Determinants of Health   Financial Resource Strain:   . Difficulty of Paying Living Expenses: Not on file  Food Insecurity:   . Worried About Charity fundraiser in the Last Year: Not on file  . Ran Out of Food in the Last Year: Not on file  Transportation Needs:   . Lack of Transportation (Medical): Not on file  . Lack of Transportation (Non-Medical): Not on file  Physical  Activity:   . Days of Exercise per Week: Not on file  . Minutes of Exercise per Session: Not on file  Stress:   . Feeling of Stress : Not on file  Social Connections:   . Frequency of Communication with Friends and Family: Not on file  . Frequency of Social Gatherings with Friends and Family: Not on file  . Attends Religious Services: Not on file  . Active Member of Clubs or Organizations: Not on file  . Attends Archivist Meetings: Not on file  . Marital Status: Not on file    Family History  Problem Relation Age of Onset  . Hypertension Mother   . Lymphoma Mother   . Other Mother        PACEMAKER  . Heart disease Mother   . Heart attack Mother   . Hypertension Father   . Heart disease Father   . Heart attack Father   . Hypertension Sister   . Diabetes Sister   . Heart attack Sister   . Heart disease Sister   . Heart disease Brother   . Breast cancer Brother   . Breast cancer Sister   . Hypertension Maternal Grandmother   . Hypertension Paternal Grandmother   . Hypertension Paternal Grandfather     Review of Systems  Constitutional: Negative for chills and fever.  Respiratory: Negative for cough, shortness of breath and wheezing.   Cardiovascular: Negative for chest pain, palpitations and leg swelling.  Gastrointestinal: Negative for abdominal pain and nausea.  Musculoskeletal: Negative for myalgias.  Neurological: Negative for dizziness, light-headedness and headaches.       Objective:   Vitals:   05/07/19 1056  BP: 130/84  Pulse: 72  Resp: 16  Temp: 98.1 F (36.7 C)  SpO2: 99%   BP Readings from Last 3 Encounters:  05/07/19 130/84  12/31/18 (!) 150/76  12/18/18 116/76   Wt Readings from Last 3 Encounters:  05/07/19 143 lb (64.9 kg)  12/31/18 159 lb 3 oz (72.2 kg)  12/18/18 163 lb 12.8 oz (74.3 kg)   Body mass index is 23.8 kg/m.   Physical Exam    Constitutional: Appears well-developed and well-nourished. No distress.  HENT:   Head: Normocephalic and atraumatic.  Neck: Neck supple. No tracheal deviation present. No thyromegaly present.  No cervical lymphadenopathy Cardiovascular: Normal rate, regular rhythm and normal heart sounds.   No murmur heard. No carotid bruit .  No edema Pulmonary/Chest: Effort normal and breath sounds normal. No respiratory distress. No has no wheezes. No rales. Abdomen: Soft, nontender, nondistended Skin: Skin is warm and dry. Not diaphoretic.  Psychiatric: Normal mood and affect. Behavior is normal.      Assessment &  Plan:    See Problem List for Assessment and Plan of chronic medical problems.    This visit occurred during the SARS-CoV-2 public health emergency.  Safety protocols were in place, including screening questions prior to the visit, additional usage of staff PPE, and extensive cleaning of exam room while observing appropriate contact time as indicated for disinfecting solutions.

## 2019-05-07 ENCOUNTER — Other Ambulatory Visit: Payer: Self-pay

## 2019-05-07 ENCOUNTER — Encounter: Payer: Self-pay | Admitting: Internal Medicine

## 2019-05-07 ENCOUNTER — Ambulatory Visit (INDEPENDENT_AMBULATORY_CARE_PROVIDER_SITE_OTHER): Payer: Medicare Other | Admitting: Internal Medicine

## 2019-05-07 VITALS — BP 130/84 | HR 72 | Temp 98.1°F | Resp 16 | Ht 65.0 in | Wt 143.0 lb

## 2019-05-07 DIAGNOSIS — R7303 Prediabetes: Secondary | ICD-10-CM | POA: Diagnosis not present

## 2019-05-07 DIAGNOSIS — E785 Hyperlipidemia, unspecified: Secondary | ICD-10-CM

## 2019-05-07 DIAGNOSIS — M1710 Unilateral primary osteoarthritis, unspecified knee: Secondary | ICD-10-CM

## 2019-05-07 DIAGNOSIS — K219 Gastro-esophageal reflux disease without esophagitis: Secondary | ICD-10-CM

## 2019-05-07 DIAGNOSIS — F4323 Adjustment disorder with mixed anxiety and depressed mood: Secondary | ICD-10-CM | POA: Diagnosis not present

## 2019-05-07 LAB — CBC WITH DIFFERENTIAL/PLATELET
Basophils Absolute: 0.1 10*3/uL (ref 0.0–0.1)
Basophils Relative: 1.2 % (ref 0.0–3.0)
Eosinophils Absolute: 0.2 10*3/uL (ref 0.0–0.7)
Eosinophils Relative: 2.6 % (ref 0.0–5.0)
HCT: 37.2 % (ref 36.0–46.0)
Hemoglobin: 12.5 g/dL (ref 12.0–15.0)
Lymphocytes Relative: 22.2 % (ref 12.0–46.0)
Lymphs Abs: 1.3 10*3/uL (ref 0.7–4.0)
MCHC: 33.7 g/dL (ref 30.0–36.0)
MCV: 82.5 fl (ref 78.0–100.0)
Monocytes Absolute: 0.5 10*3/uL (ref 0.1–1.0)
Monocytes Relative: 8.4 % (ref 3.0–12.0)
Neutro Abs: 3.8 10*3/uL (ref 1.4–7.7)
Neutrophils Relative %: 65.6 % (ref 43.0–77.0)
Platelets: 295 10*3/uL (ref 150.0–400.0)
RBC: 4.51 Mil/uL (ref 3.87–5.11)
RDW: 14 % (ref 11.5–15.5)
WBC: 5.8 10*3/uL (ref 4.0–10.5)

## 2019-05-07 LAB — COMPREHENSIVE METABOLIC PANEL
ALT: 22 U/L (ref 0–35)
AST: 19 U/L (ref 0–37)
Albumin: 4.2 g/dL (ref 3.5–5.2)
Alkaline Phosphatase: 74 U/L (ref 39–117)
BUN: 27 mg/dL — ABNORMAL HIGH (ref 6–23)
CO2: 31 mEq/L (ref 19–32)
Calcium: 9.5 mg/dL (ref 8.4–10.5)
Chloride: 101 mEq/L (ref 96–112)
Creatinine, Ser: 0.85 mg/dL (ref 0.40–1.20)
GFR: 66.8 mL/min (ref >60.00)
Glucose, Bld: 95 mg/dL (ref 70–99)
Potassium: 4 mEq/L (ref 3.5–5.1)
Sodium: 138 mEq/L (ref 135–145)
Total Bilirubin: 0.6 mg/dL (ref 0.2–1.2)
Total Protein: 6.9 g/dL (ref 6.0–8.3)

## 2019-05-07 LAB — LIPID PANEL
Cholesterol: 144 mg/dL (ref 0–200)
HDL: 58.8 mg/dL (ref >39.00)
LDL Cholesterol: 74 mg/dL (ref 0–99)
NonHDL: 85.33
Total CHOL/HDL Ratio: 2
Triglycerides: 58 mg/dL (ref 0.0–149.0)
VLDL: 11.6 mg/dL (ref 0.0–40.0)

## 2019-05-07 LAB — TSH: TSH: 1.94 u[IU]/mL (ref 0.35–4.50)

## 2019-05-07 MED ORDER — OMEPRAZOLE 20 MG PO CPDR: 20.0000 mg | DELAYED_RELEASE_CAPSULE | Freq: Every day | ORAL | 1 refills | Status: DC

## 2019-05-07 MED ORDER — FLUOXETINE HCL 20 MG PO CAPS: 20.0000 mg | ORAL_CAPSULE | Freq: Every day | ORAL | 1 refills | Status: DC

## 2019-05-07 MED ORDER — TRAZODONE HCL 50 MG PO TABS: 25.0000 mg | ORAL_TABLET | Freq: Every evening | ORAL | 3 refills | Status: DC | PRN

## 2019-05-07 MED ORDER — ATORVASTATIN CALCIUM 20 MG PO TABS: 20.0000 mg | ORAL_TABLET | Freq: Every day | ORAL | 1 refills | Status: DC

## 2019-05-07 NOTE — Assessment & Plan Note (Signed)
Chronic Controlled, stable Continue current dose of medication-she wonders if she still needs the medication. At some point over the next month or so she will try taking medication every other day and possibly weaning herself off to see if she still needs it or not She will contact me with any questions or concerns

## 2019-05-07 NOTE — Assessment & Plan Note (Signed)
Chronic Check lipid panel, TSH, CMP Continue daily statin Regular exercise and healthy diet encouraged

## 2019-05-07 NOTE — Assessment & Plan Note (Signed)
Chronic Walking regularly Taking meloxicam 7.5 mg every other day, which helps Continue

## 2019-05-07 NOTE — Assessment & Plan Note (Signed)
Chronic Taking omeprazole 20 mg in the morning and Pepcid 20 mg in the evening Still having GERD 2-3 times a day Continue GERD diet Discussed that she can increase the Pepcid to twice daily to see if she is able to get off of the omeprazole-we will taper off omeprazole slowly Discussed that she may need to increase the omeprazole if her GERD increases

## 2019-05-07 NOTE — Assessment & Plan Note (Signed)
Chronic Check a1c Low sugar / carb diet Stressed regular exercise  

## 2019-05-09 LAB — HEMOGLOBIN A1C: Hgb A1c MFr Bld: 5.5 % (ref 4.6–6.5)

## 2019-05-12 ENCOUNTER — Encounter: Payer: Self-pay | Admitting: Internal Medicine

## 2019-05-25 ENCOUNTER — Other Ambulatory Visit: Payer: Self-pay | Admitting: Internal Medicine

## 2019-05-30 ENCOUNTER — Other Ambulatory Visit: Payer: Self-pay | Admitting: Internal Medicine

## 2019-06-02 ENCOUNTER — Encounter: Payer: Self-pay | Admitting: Internal Medicine

## 2019-06-15 ENCOUNTER — Ambulatory Visit: Payer: Medicare Other

## 2019-06-20 DIAGNOSIS — Z01419 Encounter for gynecological examination (general) (routine) without abnormal findings: Secondary | ICD-10-CM | POA: Diagnosis not present

## 2019-06-20 DIAGNOSIS — Z1231 Encounter for screening mammogram for malignant neoplasm of breast: Secondary | ICD-10-CM | POA: Diagnosis not present

## 2019-06-20 DIAGNOSIS — Z6825 Body mass index (BMI) 25.0-25.9, adult: Secondary | ICD-10-CM | POA: Diagnosis not present

## 2019-07-18 DIAGNOSIS — L820 Inflamed seborrheic keratosis: Secondary | ICD-10-CM | POA: Diagnosis not present

## 2019-07-18 DIAGNOSIS — Z85828 Personal history of other malignant neoplasm of skin: Secondary | ICD-10-CM | POA: Diagnosis not present

## 2019-07-31 DIAGNOSIS — M67912 Unspecified disorder of synovium and tendon, left shoulder: Secondary | ICD-10-CM | POA: Diagnosis not present

## 2019-09-05 ENCOUNTER — Ambulatory Visit: Payer: Medicare Other

## 2019-09-11 ENCOUNTER — Ambulatory Visit (INDEPENDENT_AMBULATORY_CARE_PROVIDER_SITE_OTHER): Payer: Medicare Other

## 2019-09-11 DIAGNOSIS — Z0000 Encounter for general adult medical examination without abnormal findings: Secondary | ICD-10-CM | POA: Diagnosis not present

## 2019-09-11 NOTE — Patient Instructions (Signed)
Victoria Stone , Thank you for taking time to come for your Medicare Wellness Visit. I appreciate your ongoing commitment to your health goals. Please review the following plan we discussed and let me know if I can assist you in the future.   Screening recommendations/referrals: Colonoscopy: last done 02/14/2011; due every 10 years Mammogram: last done 03/11/2016; due every year Bone Density: last done 03/11/2016; due every 5 years Recommended yearly ophthalmology/optometry visit for glaucoma screening and checkup Recommended yearly dental visit for hygiene and checkup  Vaccinations: Influenza vaccine: 12/14/2018 Pneumococcal vaccine: 10/31/2017 Prevnar13; need Pneumovax23 Tdap vaccine: 12/06/2010; due every 10 years Shingles vaccine: completed   Covid-19: completed  Advanced directives: Please bring a copy of your health care power of attorney and living will to the office at your convenience.  Conditions/risks identified: Please continue to do your personal lifestyle choices by: daily care of teeth and gums, regular physical activity (goal should be 5 days a week for 30 minutes), eat a healthy diet, avoid tobacco and drug use, limiting any alcohol intake, taking a low-dose aspirin (if not allergic or have been advised by your provider otherwise) and taking vitamins and minerals as recommended by your provider. Continue doing brain stimulating activities (puzzles, reading, adult coloring books, staying active) to keep memory sharp. Continue to eat heart healthy diet (full of fruits, vegetables, whole grains, lean protein, water--limit salt, fat, and sugar intake) and increase physical activity as tolerated.  Next appointment: Please schedule your next Medicare Wellness Visit with your Nurse Health Advisor in 1 year.  Preventive Care 75 Years and Older, Female Preventive care refers to lifestyle choices and visits with your health care provider that can promote health and wellness. What does  preventive care include?  A yearly physical exam. This is also called an annual well check.  Dental exams once or twice a year.  Routine eye exams. Ask your health care provider how often you should have your eyes checked.  Personal lifestyle choices, including:  Daily care of your teeth and gums.  Regular physical activity.  Eating a healthy diet.  Avoiding tobacco and drug use.  Limiting alcohol use.  Practicing safe sex.  Taking low-dose aspirin every day.  Taking vitamin and mineral supplements as recommended by your health care provider. What happens during an annual well check? The services and screenings done by your health care provider during your annual well check will depend on your age, overall health, lifestyle risk factors, and family history of disease. Counseling  Your health care provider may ask you questions about your:  Alcohol use.  Tobacco use.  Drug use.  Emotional well-being.  Home and relationship well-being.  Sexual activity.  Eating habits.  History of falls.  Memory and ability to understand (cognition).  Work and work Statistician.  Reproductive health. Screening  You may have the following tests or measurements:  Height, weight, and BMI.  Blood pressure.  Lipid and cholesterol levels. These may be checked every 5 years, or more frequently if you are over 6 years old.  Skin check.  Lung cancer screening. You may have this screening every year starting at age 7 if you have a 30-pack-year history of smoking and currently smoke or have quit within the past 15 years.  Fecal occult blood test (FOBT) of the stool. You may have this test every year starting at age 48.  Flexible sigmoidoscopy or colonoscopy. You may have a sigmoidoscopy every 5 years or a colonoscopy every 10 years starting at  age 33.  Hepatitis C blood test.  Hepatitis B blood test.  Sexually transmitted disease (STD) testing.  Diabetes screening. This  is done by checking your blood sugar (glucose) after you have not eaten for a while (fasting). You may have this done every 1-3 years.  Bone density scan. This is done to screen for osteoporosis. You may have this done starting at age 22.  Mammogram. This may be done every 1-2 years. Talk to your health care provider about how often you should have regular mammograms. Talk with your health care provider about your test results, treatment options, and if necessary, the need for more tests. Vaccines  Your health care provider may recommend certain vaccines, such as:  Influenza vaccine. This is recommended every year.  Tetanus, diphtheria, and acellular pertussis (Tdap, Td) vaccine. You may need a Td booster every 10 years.  Zoster vaccine. You may need this after age 20.  Pneumococcal 13-valent conjugate (PCV13) vaccine. One dose is recommended after age 76.  Pneumococcal polysaccharide (PPSV23) vaccine. One dose is recommended after age 55. Talk to your health care provider about which screenings and vaccines you need and how often you need them. This information is not intended to replace advice given to you by your health care provider. Make sure you discuss any questions you have with your health care provider. Document Released: 03/27/2015 Document Revised: 11/18/2015 Document Reviewed: 12/30/2014 Elsevier Interactive Patient Education  2017 [place] Prevention in the Home Falls can cause injuries. They can happen to people of all ages. There are many things you can do to make your home safe and to help prevent falls. What can I do on the outside of my home?  Regularly fix the edges of walkways and driveways and fix any cracks.  Remove anything that might make you trip as you walk through a door, such as a raised step or threshold.  Trim any bushes or trees on the path to your home.  Use bright outdoor lighting.  Clear any walking paths of anything that might make  someone trip, such as rocks or tools.  Regularly check to see if handrails are loose or broken. Make sure that both sides of any steps have handrails.  Any raised decks and porches should have guardrails on the edges.  Have any leaves, snow, or ice cleared regularly.  Use sand or salt on walking paths during winter.  Clean up any spills in your garage right away. This includes oil or grease spills. What can I do in the bathroom?  Use night lights.  Install grab bars by the toilet and in the tub and shower. Do not use towel bars as grab bars.  Use non-skid mats or decals in the tub or shower.  If you need to sit down in the shower, use a plastic, non-slip stool.  Keep the floor dry. Clean up any water that spills on the floor as soon as it happens.  Remove soap buildup in the tub or shower regularly.  Attach bath mats securely with double-sided non-slip rug tape.  Do not have throw rugs and other things on the floor that can make you trip. What can I do in the bedroom?  Use night lights.  Make sure that you have a light by your bed that is easy to reach.  Do not use any sheets or blankets that are too big for your bed. They should not hang down onto the floor.  Have a firm chair  that has side arms. You can use this for support while you get dressed.  Do not have throw rugs and other things on the floor that can make you trip. What can I do in the kitchen?  Clean up any spills right away.  Avoid walking on wet floors.  Keep items that you use a lot in easy-to-reach places.  If you need to reach something above you, use a strong step stool that has a grab bar.  Keep electrical cords out of the way.  Do not use floor polish or wax that makes floors slippery. If you must use wax, use non-skid floor wax.  Do not have throw rugs and other things on the floor that can make you trip. What can I do with my stairs?  Do not leave any items on the stairs.  Make sure that  there are handrails on both sides of the stairs and use them. Fix handrails that are broken or loose. Make sure that handrails are as long as the stairways.  Check any carpeting to make sure that it is firmly attached to the stairs. Fix any carpet that is loose or worn.  Avoid having throw rugs at the top or bottom of the stairs. If you do have throw rugs, attach them to the floor with carpet tape.  Make sure that you have a light switch at the top of the stairs and the bottom of the stairs. If you do not have them, ask someone to add them for you. What else can I do to help prevent falls?  Wear shoes that:  Do not have high heels.  Have rubber bottoms.  Are comfortable and fit you well.  Are closed at the toe. Do not wear sandals.  If you use a stepladder:  Make sure that it is fully opened. Do not climb a closed stepladder.  Make sure that both sides of the stepladder are locked into place.  Ask someone to hold it for you, if possible.  Clearly mark and make sure that you can see:  Any grab bars or handrails.  First and last steps.  Where the edge of each step is.  Use tools that help you move around (mobility aids) if they are needed. These include:  Canes.  Walkers.  Scooters.  Crutches.  Turn on the lights when you go into a dark area. Replace any light bulbs as soon as they burn out.  Set up your furniture so you have a clear path. Avoid moving your furniture around.  If any of your floors are uneven, fix them.  If there are any pets around you, be aware of where they are.  Review your medicines with your doctor. Some medicines can make you feel dizzy. This can increase your chance of falling. Ask your doctor what other things that you can do to help prevent falls. This information is not intended to replace advice given to you by your health care provider. Make sure you discuss any questions you have with your health care provider. Document Released:  12/25/2008 Document Revised: 08/06/2015 Document Reviewed: 04/04/2014 Elsevier Interactive Patient Education  2017 [employer].

## 2019-09-11 NOTE — Progress Notes (Signed)
I connected with [name] today by telephone and verified that I am speaking with the correct person using two identifiers. Location patient: home Location provider: work Persons participating in the virtual visit: Anner [employer] and [employer]. Sammy Douthitt, LPN  I discussed the limitations, risks, security and privacy concerns of performing an evaluation and management service by telephone and the availability of in person appointments. I also discussed with the patient that there may be a patient responsible charge related to this service. The patient expressed understanding and verbally consented to this telephonic visit.    Interactive audio and video telecommunications were attempted between this provider and patient, however failed, due to patient having technical difficulties OR patient did not have access to video capability.  We continued and completed visit with audio only.  Some vital signs may be absent or patient reported.   Time Spent with patient on telephone encounter: 25 minutes  Subjective:   Victoria Stone is a 69 y.o. female who presents for Medicare Annual (Subsequent) preventive examination.  Review of Systems    No ROS. Medicare Wellness Virtual Visit. Additional risk factors are reflected in social history. Cardiac Risk Factors include: advanced age (>69mn, >>72women);dyslipidemia;family history of premature cardiovascular disease;hypertension     Objective:    There were no vitals filed for this visit. There is no height or weight on file to calculate BMI.  Advanced Directives 09/11/2019 12/31/2018 12/24/2018 09/24/2015  Does Patient Have a Medical Advance Directive? Yes Yes Yes Yes  Type of AParamed[place]of [place] will [place] will [place] of facility DNR (pink MOST or yellow form) -  Does patient want to make changes to medical advance directive? No - Patient declined No -  Patient declined - -  Copy of [place] Chart? No - copy requested - - -    Current Medications (verified) Outpatient Encounter Medications as of 09/11/2019  Medication Sig  . atorvastatin (LIPITOR) 20 MG tablet Take 1 tablet (20 mg total) by mouth daily.  . Calcium Carbonate (CALCIUM 600 PO) Take by mouth daily.  .Marland KitchenFLUoxetine (PROZAC) 20 MG capsule Take 1 capsule (20 mg total) by mouth daily.  . meloxicam (MOBIC) 7.5 MG tablet TAKE 1 TABLET (7.5 MG TOTAL) BY MOUTH EVERY OTHER DAY.  . Multiple Vitamins-Minerals (MULTIVITAMIN) tablet Take 1 tablet by mouth daily.  .Marland Kitchenomeprazole (PRILOSEC) 20 MG capsule Take 1 capsule (20 mg total) by mouth daily.  . traZODone (DESYREL) 50 MG tablet TAKE 0.5-1 TABLETS (25-50 MG TOTAL) BY MOUTH AT BEDTIME AS NEEDED FOR SLEEP.  .Marland Kitchentriamcinolone ointment (KENALOG) 0.5 % Apply 1 application topically 2 (two) times daily as needed (foot rash).  . Omega-3 Fatty Acids (FISH OIL) 1200 MG CAPS Take by mouth daily.  (Patient not taking: Reported on 09/11/2019)   No facility-administered encounter medications on file as of 09/11/2019.    Allergies (verified) Penicillins   History: Past Medical History:  Diagnosis Date  . ALLERGIC RHINITIS   . ARTHRITIS   . Arthritis of knee 11/20/2008  . Carpal tunnel syndrome on right 04/04/2017  . Chest pain 07/15/2015  . Dyslipidemia   . Family history of premature coronary artery disease 07/15/2015  . GERD (gastroesophageal reflux disease) 02/08/2016  . Obese   . Prediabetes 04/06/2017  . ULCERATIVE COLITIS   . [place]   Past Surgical History:  Procedure Laterality Date  . ABDOMINAL HYSTERECTOMY  2001  . BONE SPUR    . BREAST SURGERY  1988   Reduction  . CARPAL TUNNEL RELEASE Right 12/31/2018   Procedure: RIGHT CARPAL TUNNEL RELEASE;  Surgeon: Leanora Cover, MD;  Location: [facility];  Service: Orthopedics;  Laterality: Right;  . FOOT SURGERY  90, 04, 09   X'S 3    . HAMMER TOE SURGERY    . LAPAROSCOPIC GASTRIC BANDING  2005  . TUBAL LIGATION  1988   Family History  Problem Relation Age of Onset  . Hypertension Mother   . Lymphoma Mother   . Other Mother        PACEMAKER  . Heart disease Mother   . Heart attack Mother   . Hypertension Father   . Heart disease Father   . Heart attack Father   . Hypertension Sister   . Diabetes Sister   . Heart attack Sister   . Heart disease Sister   . Heart disease Brother   . Breast cancer Brother   . Breast cancer Sister   . Hypertension Maternal Grandmother   . Hypertension Paternal Grandmother   . Hypertension Paternal Grandfather    Social History   Socioeconomic History  . Marital status: Married    Spouse name: Not on file  . Number of children: 2  . Years of education: Not on file  . Highest education level: Not on file  Occupational History  . Occupation: Programmer, multimedia: [employer]  Tobacco Use  . Smoking status: Never Smoker  . Smokeless tobacco: Never Used  . Tobacco comment: Married, 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[facility]  Vaping Use  . Vaping Use: Never used  Substance and Sexual Activity  . Alcohol use: No    Comment: occasionally  . Drug use: No  . Sexual activity: Not on file  Other Topics Concern  . Not on file  Social History Narrative   RN at [employer]   Married, lives with spouse   Social Determinants of Health   Financial Resource Strain: Low Risk   . Difficulty of Paying Living Expenses: Not hard at all  Food Insecurity: No Food Insecurity  . Worried About Charity fundraiser in the Last Year: Never true  . Ran Out of Food in the Last Year: Never true  Transportation Needs: No Transportation Needs  . Lack of Transportation (Medical): No  . Lack of Transportation (Non-Medical): No  Physical Activity: Sufficiently Active  . Days of Exercise per Week: 5 days  . Minutes of Exercise per Session: 30 min  Stress: No Stress Concern Present  . Feeling  of Stress : Not at all  Social Connections: Socially Integrated  . Frequency of Communication with Friends and Family: More than three times a week  . Frequency of Social Gatherings with Friends and Family: More than three times a week  . Attends Religious Services: More than 4 times per year  . Active Member of Clubs or Organizations: Yes  . Attends Archivist Meetings: More than 4 times per year  . Marital Status: Married    Tobacco Counseling Counseling given: Not Answered Comment: Married, 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[employer]   Clinical Intake:  Pre-visit preparation completed: Yes  Pain : No/denies pain     Nutritional Risks: None Diabetes: No  How often do you need to have someone help you when you read instructions, pamphlets, or other written materials from your doctor or pharmacy?:  1 - Never What is the last grade level you completed in school?: Associates Degree in RN  Diabetic? no  Interpreter Needed?: No  Information entered by :: Keatin Benham N. Taesha Goodell, LPN   Activities of Daily Living In your present state of health, do you have any difficulty performing the following activities: 09/11/2019 12/31/2018  Hearing? N N  Vision? N N  Difficulty concentrating or making decisions? N N  Walking or climbing stairs? N N  Dressing or bathing? N N  Doing errands, shopping? N -  Preparing Food and eating ? N -  Using the Toilet? N -  In the past six months, have you accidently leaked urine? N -  Do you have problems with loss of bowel control? N -  Managing your Medications? N -  Managing your Finances? N -  Housekeeping or managing your Housekeeping? N -  Some recent data might be hidden    Patient Care Team: Binnie Rail, MD as PCP - General (Internal Medicine) Larey Dresser, MD as Consulting Physician (Cardiology) Marylynn Pearson, MD as Consulting Physician (Obstetrics and Gynecology) Sable Feil, MD (Gastroenterology) Kennith Center,  RD as Dietitian (Family Medicine)  Indicate any recent Medical Services you may have received from other than [facility] providers in the past year (date may be approximate).     Assessment:   This is a routine wellness examination for Aren.  Hearing/Vision screen No exam data present  Dietary issues and exercise activities discussed: Current Exercise Habits: Home exercise routine, Type of exercise: treadmill;walking;strength training/weights;stretching (TV Exercises and yard work), Time (Minutes): 30, Frequency (Times/Week): 5, Weekly Exercise (Minutes/Week): 150, Intensity: Moderate, Exercise limited by: None identified  Goals    .  Client understands the importance of follow-up with providers by attending scheduled visits (pt-stated)      To maintain my current health status by continuing to eat healthy and stay physically active & socially active.      Depression Screen PHQ 2/9 Scores 09/11/2019 05/08/2018 04/04/2017 09/24/2015  PHQ - 2 Score 0 0 0 0  PHQ- 9 Score - 0 - -    Fall Risk Fall Risk  09/11/2019 05/08/2018 04/04/2017 09/24/2015  Falls in the past year? 0 0 No No  Number falls in past yr: 0 - - -  Injury with Fall? 0 - - -  Risk for fall due to : No Fall Risks - - -  Follow up Falls evaluation completed - - -    Any stairs in or around the home? Yes  If so, are there any without handrails? No  Home free of loose throw rugs in walkways, pet beds, electrical cords, etc? Yes  Adequate lighting in your home to reduce risk of falls? Yes   ASSISTIVE DEVICES UTILIZED TO PREVENT FALLS:  Life alert? No  Use of a cane, walker or w/c? No  Grab bars in the bathroom? Yes  Shower chair or bench in shower? Yes  Elevated toilet seat or a handicapped toilet? Yes   TIMED UP AND GO:  Was the test performed? No .  Length of time to ambulate 10 feet: 0 sec.   Gait steady and fast without use of assistive device (per patient) Cognitive Function: MMSE - Mini Mental State Exam  09/11/2019  Not completed: Refused     6CIT Screen 09/11/2019  What Year? 0 points  What month? 0 points  What time? 0 points  Count back from 20 0 points  Months in reverse  0 points  Repeat phrase 0 points  Total Score 0    Immunizations Immunization History  Administered Date(s) Administered  . Fluad Quad(high Dose 65+) 12/14/2018  . Influenza Split 12/13/2011, 12/13/2014  . Influenza, High Dose Seasonal PF 12/08/2017  . Influenza-Unspecified 12/13/2015, 12/29/2016  . PFIZER SARS-COV-2 Vaccination 04/03/2019, 04/24/2019  . Pneumococcal Conjugate-13 10/31/2017  . Tdap 12/06/2010  . Zoster 10/26/2012  . Zoster Recombinat (Shingrix) 04/05/2017, 06/08/2017    TDAP status: Up to date Flu Vaccine status: Up to date Pneumococcal vaccine status: Up to date; need Pneumovax23 Covid-19 vaccine status: Completed vaccines  Qualifies for Shingles Vaccine? Yes   Zostavax completed Yes   Shingrix Completed?: Yes  Screening Tests Health Maintenance  Topic Date Due  . MAMMOGRAM  03/11/2018  . PNA vac Low Risk Adult (2 of 2 - PPSV23) 05/06/2020 (Originally 11/01/2018)  . INFLUENZA VACCINE  10/13/2019  . TETANUS/TDAP  12/05/2020  . COLONOSCOPY  02/13/2021  . DEXA SCAN  03/11/2021  . COVID-19 Vaccine  Completed  . Hepatitis C Screening  Completed    Health Maintenance  Health Maintenance Due  Topic Date Due  . MAMMOGRAM  03/11/2018    Colorectal cancer screening: Completed 02/14/2011. Repeat every 10 years Mammogram status: Completed 03/11/2016. Repeat every year Bone Density status: Completed 03/11/2016. Results reflect: Bone density results: NORMAL. Repeat every 5 years.  Lung Cancer Screening: (Low Dose CT Chest recommended if Age 48-80 years, 30 pack-year currently smoking OR have quit w/in 15years.) does not qualify.   Lung Cancer Screening Referral: no  Additional Screening:  Hepatitis C Screening: does qualify; Completed yes  Vision Screening: Recommended annual  ophthalmology exams for early detection of glaucoma and other disorders of the eye. Is the patient up to date with their annual eye exam?  Yes  Who is the provider or what is the name of the office in which the patient attends annual eye exams? Katy Apo, MD If pt is not established with a provider, would they like to be referred to a provider to establish care? No .   Dental Screening: Recommended annual dental exams for proper oral hygiene  Community Resource Referral / Chronic Care Management: CRR required this visit?  No   CCM required this visit?  No      Plan:    Reviewed health maintenance screenings with patient today and relevant education, vaccines, and/or referrals were provided.    Continue doing brain stimulating activities (puzzles, reading, adult coloring books, staying active) to keep memory sharp.    Continue to eat heart healthy diet (full of fruits, vegetables, whole grains, lean protein, water--limit salt, fat, and sugar intake) and increase physical activity as tolerated.  I have personally reviewed and noted the following in the patient's chart:   . Medical and social history . Use of alcohol, tobacco or illicit drugs  . Current medications and supplements . Functional ability and status . Nutritional status . Physical activity . Advanced directives . List of other physicians . Hospitalizations, surgeries, and ER visits in previous 12 months . Vitals . Screenings to include cognitive, depression, and falls . Referrals and appointments  In addition, I have reviewed and discussed with patient certain preventive protocols, quality metrics, and best practice recommendations. A written personalized care plan for preventive services as well as general preventive health recommendations were provided to patient.     Sheral Flow, LPN   [id number]   Nurse Notes:  There were no vitals filed for this visit. There  is no height or weight on file to  calculate BMI.

## 2019-10-07 ENCOUNTER — Other Ambulatory Visit: Payer: Self-pay | Admitting: Internal Medicine

## 2019-10-09 DIAGNOSIS — S62024K Nondisplaced fracture of middle third of navicular [scaphoid] bone of right wrist, subsequent encounter for fracture with nonunion: Secondary | ICD-10-CM | POA: Diagnosis not present

## 2019-10-09 DIAGNOSIS — M65311 Trigger thumb, right thumb: Secondary | ICD-10-CM | POA: Insufficient documentation

## 2019-10-16 ENCOUNTER — Ambulatory Visit: Payer: Medicare Other

## 2019-11-03 DIAGNOSIS — Z20822 Contact with and (suspected) exposure to covid-19: Secondary | ICD-10-CM | POA: Diagnosis not present

## 2019-11-06 DIAGNOSIS — G479 Sleep disorder, unspecified: Secondary | ICD-10-CM | POA: Insufficient documentation

## 2019-11-06 NOTE — Progress Notes (Signed)
Subjective:    Patient ID: MASSA PE, female    DOB: [birth year], 69 y.o.   MRN: [id number]  HPI The patient is here for follow up of their chronic medical problems, including prediabetes, GERD, depression, anxiety, hyperlipidemia, knee arthritis, sleep difficulties  She is exercising regularly.   She walks.   She stopped the trazodone.  It affected her memory.     taking omeprazole 3-4 / week.  GERD is controlled.    Medications and allergies reviewed with patient and updated if appropriate.  Patient Active Problem List   Diagnosis Date Noted  . Sleep difficulties 11/06/2019  . Prediabetes 04/06/2017  . Carpal tunnel syndrome on right 04/04/2017  . Family history of premature CAD 07/20/2016  . GERD (gastroesophageal reflux disease) 02/08/2016  . Chest pain 07/15/2015  . Dyslipidemia   . Adjustment disorder with mixed anxiety and depressed mood 11/20/2008  . ALLERGIC RHINITIS 11/20/2008  . Ulcerative colitis ([place]) 11/20/2008  . Arthritis of knee 11/20/2008    Current Outpatient Medications on File Prior to Visit  Medication Sig Dispense Refill  . atorvastatin (LIPITOR) 20 MG tablet Take 1 tablet (20 mg total) by mouth daily. 90 tablet 1  . Calcium Carbonate (CALCIUM 600 PO) Take by mouth daily.    Marland Kitchen FLUoxetine (PROZAC) 20 MG capsule TAKE 1 CAPSULE BY MOUTH EVERY DAY 90 capsule 1  . meloxicam (MOBIC) 7.5 MG tablet TAKE 1 TABLET (7.5 MG TOTAL) BY MOUTH EVERY OTHER DAY. 45 tablet 1  . Multiple Vitamins-Minerals (MULTIVITAMIN) tablet Take 1 tablet by mouth daily.    Marland Kitchen omeprazole (PRILOSEC) 20 MG capsule Take 1 capsule (20 mg total) by mouth daily. 90 capsule 1  . triamcinolone ointment (KENALOG) 0.5 % Apply 1 application topically 2 (two) times daily as needed (foot rash). 30 g 1   No current facility-administered medications on file prior to visit.    Past Medical History:  Diagnosis Date  . ALLERGIC RHINITIS   . ARTHRITIS   . Arthritis of knee 11/20/2008  . Carpal  tunnel syndrome on right 04/04/2017  . Chest pain 07/15/2015  . Dyslipidemia   . Family history of premature coronary artery disease 07/15/2015  . GERD (gastroesophageal reflux disease) 02/08/2016  . Obese   . Prediabetes 04/06/2017  . ULCERATIVE COLITIS   . Ulcerative colitis ([place]) 11/20/2008    Past Surgical History:  Procedure Laterality Date  . ABDOMINAL HYSTERECTOMY  2001  . BONE SPUR    . BREAST SURGERY  1988   Reduction  . CARPAL TUNNEL RELEASE Right 12/31/2018   Procedure: RIGHT CARPAL TUNNEL RELEASE;  Surgeon: Leanora Cover, MD;  Location: [facility];  Service: Orthopedics;  Laterality: Right;  . FOOT SURGERY  90, 04, 09   X'S 3  . HAMMER TOE SURGERY    . LAPAROSCOPIC GASTRIC BANDING  2005  . TUBAL LIGATION  1988    Social History   Socioeconomic History  . Marital status: Married    Spouse name: Not on file  . Number of children: 2  . Years of education: Not on file  . Highest education level: Not on file  Occupational History  . Occupation: Programmer, multimedia: [employer]  Tobacco Use  . Smoking status: Never Smoker  . Smokeless tobacco: Never Used  . Tobacco comment: Married, 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[facility] [employer]  Vaping Use  . Vaping Use: Never used  Substance and Sexual Activity  . Alcohol use: No  Comment: occasionally  . Drug use: No  . Sexual activity: Not on file  Other Topics Concern  . Not on file  Social History Narrative   RN at [employer]   Married, lives with spouse   Social Determinants of Health   Financial Resource Strain: Low Risk   . Difficulty of Paying Living Expenses: Not hard at all  Food Insecurity: No Food Insecurity  . Worried About Charity fundraiser in the Last Year: Never true  . Ran Out of Food in the Last Year: Never true  Transportation Needs: No Transportation Needs  . Lack of Transportation (Medical): No  . Lack of Transportation (Non-Medical): No  Physical Activity: Sufficiently Active  . Days  of Exercise per Week: 5 days  . Minutes of Exercise per Session: 30 min  Stress: No Stress Concern Present  . Feeling of Stress : Not at all  Social Connections: Socially Integrated  . Frequency of Communication with Friends and Family: More than three times a week  . Frequency of Social Gatherings with Friends and Family: More than three times a week  . Attends Religious Services: More than 4 times per year  . Active Member of Clubs or Organizations: Yes  . Attends Archivist Meetings: More than 4 times per year  . Marital Status: Married    Family History  Problem Relation Age of Onset  . Hypertension Mother   . Lymphoma Mother   . Other Mother        PACEMAKER  . Heart disease Mother   . Heart attack Mother   . Hypertension Father   . Heart disease Father   . Heart attack Father   . Hypertension Sister   . Diabetes Sister   . Heart attack Sister   . Heart disease Sister   . Heart disease Brother   . Breast cancer Brother   . Breast cancer Sister   . Hypertension Maternal Grandmother   . Hypertension Paternal Grandmother   . Hypertension Paternal Grandfather     Review of Systems  Constitutional: Negative for chills and fever.  Respiratory: Positive for cough (occ from GERD). Negative for shortness of breath and wheezing.   Cardiovascular: Negative for chest pain, palpitations and leg swelling.  Gastrointestinal:       Jerrye Bushy controlled  Neurological: Negative for light-headedness and headaches.       Objective:   Vitals:   11/07/19 1043  BP: 126/76  Pulse: 76  Temp: 98.3 F (36.8 C)  SpO2: 97%   BP Readings from Last 3 Encounters:  11/07/19 126/76  05/07/19 130/84  12/31/18 (!) 150/76   Wt Readings from Last 3 Encounters:  11/07/19 142 lb (64.4 kg)  05/07/19 143 lb (64.9 kg)  12/31/18 159 lb 3 oz (72.2 kg)   Body mass index is 23.63 kg/m.   Physical Exam    Constitutional: Appears well-developed and well-nourished. No distress.  HENT:   Head: Normocephalic and atraumatic.  Neck: Neck supple. No tracheal deviation present. No thyromegaly present.  No cervical lymphadenopathy Cardiovascular: Normal rate, regular rhythm and normal heart sounds.   No murmur heard. No carotid bruit .  No edema Pulmonary/Chest: Effort normal and breath sounds normal. No respiratory distress. No has no wheezes. No rales.  Skin: Skin is warm and dry. Not diaphoretic.  Psychiatric: Normal mood and affect. Behavior is normal.      Assessment & Plan:    See Problem List for Assessment and Plan of chronic  medical problems.    This visit occurred during the SARS-CoV-2 public health emergency.  Safety protocols were in place, including screening questions prior to the visit, additional usage of staff PPE, and extensive cleaning of exam room while observing appropriate contact time as indicated for disinfecting solutions.

## 2019-11-06 NOTE — Patient Instructions (Addendum)
  Medications reviewed and updated.  Changes include :   none    Please followup in 6 months   

## 2019-11-07 ENCOUNTER — Other Ambulatory Visit: Payer: Self-pay

## 2019-11-07 ENCOUNTER — Ambulatory Visit (INDEPENDENT_AMBULATORY_CARE_PROVIDER_SITE_OTHER): Payer: Medicare Other | Admitting: Internal Medicine

## 2019-11-07 ENCOUNTER — Encounter: Payer: Self-pay | Admitting: Internal Medicine

## 2019-11-07 VITALS — BP 126/76 | HR 76 | Temp 98.3°F | Ht 65.0 in | Wt 142.0 lb

## 2019-11-07 DIAGNOSIS — R7303 Prediabetes: Secondary | ICD-10-CM | POA: Diagnosis not present

## 2019-11-07 DIAGNOSIS — E785 Hyperlipidemia, unspecified: Secondary | ICD-10-CM | POA: Diagnosis not present

## 2019-11-07 DIAGNOSIS — K219 Gastro-esophageal reflux disease without esophagitis: Secondary | ICD-10-CM

## 2019-11-07 DIAGNOSIS — G479 Sleep disorder, unspecified: Secondary | ICD-10-CM | POA: Diagnosis not present

## 2019-11-07 DIAGNOSIS — M1710 Unilateral primary osteoarthritis, unspecified knee: Secondary | ICD-10-CM | POA: Diagnosis not present

## 2019-11-07 DIAGNOSIS — F4323 Adjustment disorder with mixed anxiety and depressed mood: Secondary | ICD-10-CM

## 2019-11-07 NOTE — Assessment & Plan Note (Signed)
Chronic GERD controlled Continue daily medication  

## 2019-11-07 NOTE — Assessment & Plan Note (Signed)
Chronic Low sugar / carb diet Stressed regular exercise

## 2019-11-07 NOTE — Assessment & Plan Note (Signed)
Chronic Sleep is not great, but tolerable Had side effects from trazodone - affected memory Take benadryl prn only

## 2019-11-07 NOTE — Assessment & Plan Note (Signed)
Chronic Controlled, stable Continue current dose of medication prozac 20 mg

## 2019-11-07 NOTE — Assessment & Plan Note (Signed)
Chronic Walking regularly Taking meloxicam QOD continue

## 2019-11-07 NOTE — Assessment & Plan Note (Addendum)
Chronic Lipids have been controlled  Continue daily statin Regular exercise and healthy diet encouraged

## 2019-11-17 ENCOUNTER — Other Ambulatory Visit: Payer: Self-pay | Admitting: Internal Medicine

## 2019-11-25 ENCOUNTER — Telehealth: Payer: Self-pay | Admitting: Internal Medicine

## 2019-11-25 DIAGNOSIS — H52203 Unspecified astigmatism, bilateral: Secondary | ICD-10-CM | POA: Diagnosis not present

## 2019-11-25 DIAGNOSIS — H5213 Myopia, bilateral: Secondary | ICD-10-CM | POA: Diagnosis not present

## 2019-11-25 DIAGNOSIS — H2513 Age-related nuclear cataract, bilateral: Secondary | ICD-10-CM | POA: Diagnosis not present

## 2019-11-25 NOTE — Progress Notes (Signed)
  Chronic Care Management   Outreach Note  11/25/2019 Name: Victoria Stone MRN: [id number] DOB: [birth year]  Referred by: Binnie Rail, MD Reason for referral : No chief complaint on file.   An unsuccessful telephone outreach was attempted today. The patient was referred to the pharmacist for assistance with care management and care coordination.   Follow Up Plan:   Carley Perdue [employer] Scheduler

## 2019-11-26 ENCOUNTER — Ambulatory Visit: Payer: Medicare Other | Attending: Internal Medicine

## 2019-11-26 DIAGNOSIS — Z23 Encounter for immunization: Secondary | ICD-10-CM

## 2019-11-26 NOTE — Progress Notes (Signed)
   Covid-19 Vaccination Clinic  Name:  Victoria Stone    MRN: [id number] DOB: 05-01-1950  11/26/2019  [name] was observed post Covid-19 immunization for 15 minutes without incident. She was provided with Vaccine Information Sheet and instruction to access the V-Safe system.   [name] was instructed to call 911 with any severe reactions post vaccine: Marland Kitchen Difficulty breathing  . Swelling of face and throat  . A fast heartbeat  . A bad rash all over body  . Dizziness and weakness

## 2019-12-09 ENCOUNTER — Telehealth: Payer: Self-pay | Admitting: Internal Medicine

## 2019-12-09 NOTE — Progress Notes (Signed)
  Chronic Care Management   Note  12/09/2019 Name: ZAMARA COZAD MRN: [id number] DOB: Oct 27, 1950  KRISSY OREBAUGH is a 69 y.o. year old female who is a primary care patient of Burns, Claudina Lick, MD. I reached out to Tera Helper by phone today in response to a referral sent by Ms. Edgardo Roys PCP, Binnie Rail, MD.   Ms. Blanchard was given information about Chronic Care Management services today including:  1. CCM service includes personalized support from designated clinical staff supervised by her physician, including individualized plan of care and coordination with other care providers 2. 24/7 contact phone numbers for assistance for urgent and routine care needs. 3. Service will only be billed when office clinical staff spend 20 minutes or more in a month to coordinate care. 4. Only one practitioner may furnish and bill the service in a calendar month. 5. The patient may stop CCM services at any time (effective at the end of the month) by phone call to the office staff.   Patient agreed to services and verbal consent obtained.   Follow up plan:   Carley Perdue [employer] Scheduler

## 2019-12-19 DIAGNOSIS — Z23 Encounter for immunization: Secondary | ICD-10-CM | POA: Diagnosis not present

## 2019-12-23 ENCOUNTER — Ambulatory Visit: Payer: Medicare Other

## 2019-12-25 ENCOUNTER — Ambulatory Visit: Payer: Medicare Other | Admitting: Cardiology

## 2020-01-17 ENCOUNTER — Encounter: Payer: Self-pay | Admitting: Cardiology

## 2020-01-17 ENCOUNTER — Ambulatory Visit (INDEPENDENT_AMBULATORY_CARE_PROVIDER_SITE_OTHER): Payer: Medicare Other | Admitting: Cardiology

## 2020-01-17 ENCOUNTER — Other Ambulatory Visit: Payer: Self-pay

## 2020-01-17 VITALS — BP 118/60 | HR 67 | Ht 65.0 in | Wt 147.8 lb

## 2020-01-17 DIAGNOSIS — E785 Hyperlipidemia, unspecified: Secondary | ICD-10-CM | POA: Diagnosis not present

## 2020-01-17 DIAGNOSIS — Z8249 Family history of ischemic heart disease and other diseases of the circulatory system: Secondary | ICD-10-CM | POA: Diagnosis not present

## 2020-01-17 NOTE — Progress Notes (Signed)
Cardiology Office Note:    Date:  01/17/2020   ID:  Victoria Stone, DOB [birth year], MRN [id number]  PCP:  Binnie Rail, MD  Cardiologist:  Fransico Him, MD    Referring MD: Binnie Rail, MD   Chief Complaint  Patient presents with  . Follow-up    Fm hx premature CAD, HLD    History of Present Illness:    Victoria Stone is a 69 y.o. female with a hx of hyperlipidemia and a strong family history of coronary disease with her brother dying of a cardiac arrest in his late 33s and autopsy showed severe CAD and also a sister with PCI's and ultimately CABG at age 67.  She had a coronary calcium score done in 2010 which was 0 and a stress test in 2018 was normal.   She is here today for followup and is doing well.  She denies any chest pain or pressure, SOB, DOE, PND, orthopnea, LE edema, dizziness, palpitations or syncope. She is compliant with her meds and is tolerating meds with no SE.    Past Medical History:  Diagnosis Date  . ALLERGIC RHINITIS   . ARTHRITIS   . Arthritis of knee 11/20/2008  . Carpal tunnel syndrome on right 04/04/2017  . Chest pain 07/15/2015  . Dyslipidemia   . Family history of premature coronary artery disease 07/15/2015  . GERD (gastroesophageal reflux disease) 02/08/2016  . Obese   . Prediabetes 04/06/2017  . ULCERATIVE COLITIS   . Ulcerative colitis ([place]) 11/20/2008    Past Surgical History:  Procedure Laterality Date  . ABDOMINAL HYSTERECTOMY  2001  . BONE SPUR    . BREAST SURGERY  1988   Reduction  . CARPAL TUNNEL RELEASE Right 12/31/2018   Procedure: RIGHT CARPAL TUNNEL RELEASE;  Surgeon: Leanora Cover, MD;  Location: [facility];  Service: Orthopedics;  Laterality: Right;  . FOOT SURGERY  90, 04, 09   X'S 3  . HAMMER TOE SURGERY    . LAPAROSCOPIC GASTRIC BANDING  2005  . TUBAL LIGATION  1988    Current Medications: No outpatient medications have been marked as taking for the 01/17/20 encounter (Office Visit) with Sueanne Margarita, MD.       Allergies:   Penicillins and Trazodone and nefazodone   Social History   Socioeconomic History  . Marital status: Married    Spouse name: Not on file  . Number of children: 2  . Years of education: Not on file  . Highest education level: Not on file  Occupational History  . Occupation: Programmer, multimedia: [employer]  Tobacco Use  . Smoking status: Never Smoker  . Smokeless tobacco: Never Used  . Tobacco comment: Married, 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[employer] [employer]  Vaping Use  . Vaping Use: Never used  Substance and Sexual Activity  . Alcohol use: No    Comment: occasionally  . Drug use: No  . Sexual activity: Not on file  Other Topics Concern  . Not on file  Social History Narrative   RN at [employer]   Married, lives with spouse   Social Determinants of Health   Financial Resource Strain: Low Risk   . Difficulty of Paying Living Expenses: Not hard at all  Food Insecurity: No Food Insecurity  . Worried About Charity fundraiser in the Last Year: Never true  . Ran Out of Food in the Last Year: Never true  Transportation Needs: No Transportation  Needs  . Lack of Transportation (Medical): No  . Lack of Transportation (Non-Medical): No  Physical Activity: Sufficiently Active  . Days of Exercise per Week: 5 days  . Minutes of Exercise per Session: 30 min  Stress: No Stress Concern Present  . Feeling of Stress : Not at all  Social Connections: Socially Integrated  . Frequency of Communication with Friends and Family: More than three times a week  . Frequency of Social Gatherings with Friends and Family: More than three times a week  . Attends Religious Services: More than 4 times per year  . Active Member of Clubs or Organizations: Yes  . Attends Archivist Meetings: More than 4 times per year  . Marital Status: Married     Family History: The patient's family history includes Breast cancer in her brother and sister; Diabetes in her sister; Heart  attack in her father, mother, and sister; Heart disease in her brother, father, mother, and sister; Hypertension in her father, maternal grandmother, mother, paternal grandfather, paternal grandmother, and sister; Lymphoma in her mother; Other in her mother.  ROS:   Please see the history of present illness.    ROS  All other systems reviewed and negative.   EKGs/Labs/Other Studies Reviewed:    The following studies were reviewed today: none  EKG:  EKG is  ordered today.  The ekg ordered today demonstrates NSR at 67bpm with no ST changes  Recent Labs: 05/07/2019: ALT 22; BUN 27; Creatinine, Ser 0.85; Hemoglobin 12.5; Platelets 295.0; Potassium 4.0; Sodium 138; TSH 1.94   Recent Lipid Panel    Component Value Date/Time   CHOL 144 05/07/2019 1132   CHOL 189 11/07/2014 0933   TRIG 58.0 05/07/2019 1132   TRIG 159 (H) 11/07/2014 0933   HDL 58.80 05/07/2019 1132   HDL 62 11/07/2014 0933   CHOLHDL 2 05/07/2019 1132   VLDL 11.6 05/07/2019 1132   LDLCALC 74 05/07/2019 1132   LDLCALC 95 11/07/2014 0933    Physical Exam:    VS:  There were no vitals taken for this visit.    Wt Readings from Last 3 Encounters:  11/07/19 142 lb (64.4 kg)  05/07/19 143 lb (64.9 kg)  12/31/18 159 lb 3 oz (72.2 kg)     GEN: Well nourished, well developed in no acute distress HEENT: Normal NECK: No JVD; No carotid bruits LYMPHATICS: No lymphadenopathy CARDIAC:RRR, no murmurs, rubs, gallops RESPIRATORY:  Clear to auscultation without rales, wheezing or rhonchi  ABDOMEN: Soft, non-tender, non-distended MUSCULOSKELETAL:  No edema; No deformity  SKIN: Warm and dry NEUROLOGIC:  Alert and oriented x 3 PSYCHIATRIC:  Normal affect    ASSESSMENT:    1. Family history of premature CAD   2. Dyslipidemia    PLAN:    In order of problems listed above:  1.  Family hx of premature CAD -she has had multiple family members who have had CAD diagnosed in their 73s.  -Calcium score in 2010 and 2018 was  0 -stress test in 2018 normal -she has not had any anginal symptoms.   -no further testing at this time unless she becomes symptomatic until next year and we will repeat a coronary Ca score and ETT since it will be 4 years out from last assessment  2.  HLD -LDL goal < 100 -LDL was 74 in Feb 2021 -Repeat FLp and ALT in Feb 2022 -continue on Atorvastatin 41m daily   Medication Adjustments/Labs and Tests Ordered: Current medicines are reviewed at length  with the patient today.  Concerns regarding medicines are outlined above.  No orders of the defined types were placed in this encounter.  No orders of the defined types were placed in this encounter.   Signed, Fransico Him, MD  01/17/2020 10:55 AM    [place]

## 2020-01-17 NOTE — Patient Instructions (Signed)
Medication Instructions:  Your physician recommends that you continue on your current medications as directed. Please refer to the Current Medication list given to you today.  Labwork: Your physician recommends that you return for lab work in:   [place] and ALT in Feb 2022   Testing/Procedures: None ordered.  Follow-Up: Your physician recommends that you schedule a follow-up appointment in: 12 months with Dr. Radford Pax  Any Other Special Instructions Will Be Listed Below (If Applicable).     If you need a refill on your cardiac medications before your next appointment, please call your pharmacy.

## 2020-01-29 ENCOUNTER — Ambulatory Visit: Payer: Medicare Other | Admitting: Pharmacist

## 2020-01-29 ENCOUNTER — Other Ambulatory Visit: Payer: Self-pay

## 2020-01-29 DIAGNOSIS — E785 Hyperlipidemia, unspecified: Secondary | ICD-10-CM

## 2020-01-29 DIAGNOSIS — K219 Gastro-esophageal reflux disease without esophagitis: Secondary | ICD-10-CM

## 2020-01-29 DIAGNOSIS — F4323 Adjustment disorder with mixed anxiety and depressed mood: Secondary | ICD-10-CM

## 2020-01-29 DIAGNOSIS — R7303 Prediabetes: Secondary | ICD-10-CM

## 2020-01-29 NOTE — Chronic Care Management (AMB) (Signed)
Chronic Care Management Pharmacy  Name: Victoria Stone  MRN: [id number] DOB: [birth year]   Chief Complaint/ HPI  Victoria Stone,  69 y.o. , female presents for their Initial CCM visit with the clinical pharmacist In office.  PCP : Binnie Rail, MD Patient Care Team: Binnie Rail, MD as PCP - General (Internal Medicine) Sueanne Margarita, MD as PCP - Cardiology (Cardiology) Larey Dresser, MD as Consulting Physician (Cardiology) Marylynn Pearson, MD as Consulting Physician (Obstetrics and Gynecology) Sable Feil, MD (Gastroenterology) Kennith Center, RD as Dietitian (Family Medicine) Charlton Haws, [facility] as Pharmacist (Pharmacist)  Their chronic conditions include: Hyperlipidemia, GERD, Depression, Anxiety and Allergic Rhinitis, Ulcerative colitis, hx skin cancer  Office Visits: 11/08/19 Dr Quay Burow OV: chronic f/u. Stopped trazodone due to side effects (memory). Taking Benadryl PRN.  Consult Visit: 01/17/20 Dr Radford Pax (cardiology): f/u for HLD and strong family hx CAD. CAC 2010 was 0, 2018 stress test normal. Continue statin.  10/09/19 Dr Fredna Dow [facility]): f/u for wrist pain and trigger finger  07/31/19 Dr Tamera Punt (orhto): shoulder injection given.  Allergies  Allergen Reactions  . Penicillins Hives and Rash  . Trazodone And Nefazodone Other (See Comments)    Affected memory    Medications: Outpatient Encounter Medications as of 01/29/2020  Medication Sig  . atorvastatin (LIPITOR) 20 MG tablet Take 1 tablet (20 mg total) by mouth daily.  . Calcium Carbonate (CALCIUM 600 PO) Take by mouth daily.  Marland Kitchen FLUoxetine (PROZAC) 20 MG capsule TAKE 1 CAPSULE BY MOUTH EVERY DAY  . meloxicam (MOBIC) 7.5 MG tablet TAKE 1 TABLET (7.5 MG TOTAL) BY MOUTH EVERY OTHER DAY.  . Multiple Vitamins-Minerals (MULTIVITAMIN) tablet Take 1 tablet by mouth daily.  Marland Kitchen omeprazole (PRILOSEC) 20 MG capsule Take 1 capsule (20 mg total) by mouth daily.  Marland Kitchen triamcinolone ointment (KENALOG) 0.5 %  Apply 1 application topically 2 (two) times daily as needed (foot rash).   No facility-administered encounter medications on file as of 01/29/2020.    Wt Readings from Last 3 Encounters:  01/17/20 147 lb 12.8 oz (67 kg)  11/07/19 142 lb (64.4 kg)  05/07/19 143 lb (64.9 kg)   Lab Results  Component Value Date   CREATININE 0.85 05/07/2019   BUN 27 (H) 05/07/2019   GFR 66.80 05/07/2019   NA 138 05/07/2019   K 4.0 05/07/2019   CALCIUM 9.5 05/07/2019   CO2 31 05/07/2019    Current Diagnosis/Assessment:    Goals Addressed            This Visit's Progress   . Pharmacy Care Plan       CARE PLAN ENTRY (see longitudinal plan of care for additional care plan information)  Current Barriers:  . Chronic Disease Management support, education, and care coordination needs related to Hyperlipidemia, Depression, and Anxiety  Hyperlipidemia Lab Results  Component Value Date/Time   LDLCALC 74 05/07/2019 11:32 AM   LDLCALC 95 11/07/2014 09:33 AM .  Pharmacist Clinical Goal(s): o Over the next 180 days, patient will work with PharmD and providers to maintain LDL goal < 100 . Current regimen:  o Atorvastatin 20 mg daily . Interventions: o Discussed cholesterol goals and benefits of medications for prevention of heart attack / stroke . Patient self care activities - Over the next 180 days, patient will: o Continue medication as prescribed  Prediabetes Lab Results  Component Value Date/Time   HGBA1C 5.5 05/07/2019 11:32 AM   HGBA1C 5.6 05/08/2018 10:47 AM .  Pharmacist Clinical Goal(s): o Over the next 180 days, patient will work with PharmD and providers to maintain A1c goal <6.5% . Current regimen:  o No medication . Interventions: o Discussed importance of maintaining sugars at goal to prevent complications of diabetes including kidney damage, retinal damage, and cardiovascular disease . Patient self care activities - Over the next 180 days, patient will: o Continue control  with diet and exercise  Depression / Anxiety / Sleep issues . Pharmacist Clinical Goal(s) o Over the next 180 days, patient will work with PharmD and providers to optimize therapy . Current regimen:  o Fluoxetine 20 mg daily . Interventions: o Discussed possibility of trial off SSRI if symptoms are well controlled; pt will continue medication for now . Patient self care activities - Over the next 180 days, patient will: o Continue medication as prescribed  GERD . Pharmacist Clinical Goal(s) o Over the next 180 days, patient will work with PharmD and providers to optimize therapy . Current regimen:  o Omeprazole 20 mg daily as needed o OTC Pepcid complete at bedtime . Interventions: o Discussed long term risk associated with PPIs including bone loss . Patient self care activities - Over the next 180 days, patient will: o Continue medication as directed  Medication management . Pharmacist Clinical Goal(s): o Over the next 180 days, patient will work with PharmD and providers to maintain optimal medication adherence . [facility] . Interventions o Comprehensive medication review performed. o Continue current medication management strategy . Patient self care activities - Over the next 180 days, patient will: o [facility] o Take medications as prescribed o Report any questions or concerns to PharmD and/or provider(s)  Initial goal documentation       Hyperlipidemia   LDL goal < 100  Last lipids Lab Results  Component Value Date   CHOL 144 05/07/2019   HDL 58.80 05/07/2019   LDLCALC 74 05/07/2019   TRIG 58.0 05/07/2019   CHOLHDL 2 05/07/2019   Hepatic Function Latest Ref Rng & Units 05/07/2019 05/08/2018 04/04/2017  Total Protein 6.0 - 8.3 g/dL 6.9 7.0 6.7  Albumin 3.5 - 5.2 g/dL 4.2 4.2 4.1  AST 0 - 37 U/L 19 18 20  ALT 0 - 35 U/L 22 23 26  Alk Phosphatase 39 - 117 U/L 74 74 59  Total Bilirubin 0.2 - 1.2 mg/dL 0.6 0.8 0.7    Bilirubin, Direct 0.0 - 0.3 mg/dL - - -     The 10-year ASCVD risk score (Goff DC Jr., et al., 2013) is: 5%   Values used to calculate the score:     Age: 69 years     Sex: Female     Is Non-Hispanic African American: No     Diabetic: No     Tobacco smoker: No     Systolic Blood Pressure: 118 mmHg     Is BP treated: No     HDL Cholesterol: 58.8 mg/dL     Total Cholesterol: 144 mg/dL   Patient has failed these meds in past: n/a Patient is currently controlled on the following medications:  . Atorvastatin 20 mg daily  -not on dispense report  We discussed:  diet and exercise extensively ; Cholesterol goals; benefits of statin for ASCVD risk reduction; pt has strong family hx for CAD, but normal stress tests and calcium scores, overall relatively low risk for ASCVD. She previously had issues with muscle aches, but tolerates daily dosing now.  Plan    Continue current medications and control with diet and exercise  Depression   Depression screen PHQ 2/9 09/11/2019 05/08/2018 04/04/2017  Decreased Interest 0 0 0  Down, Depressed, Hopeless 0 0 0  PHQ - 2 Score 0 0 0  Altered sleeping - 0 -  Tired, decreased energy - 0 -  Change in appetite - 0 -  Feeling bad or failure about yourself  - 0 -  Trouble concentrating - 0 -  Moving slowly or fidgety/restless - 0 -  Suicidal thoughts - 0 -  PHQ-9 Score - 0 -   Patient has failed these meds in past: trazodone (memory issue) Patient is currently controlled on the following medications:  . Fluoxetine 20 mg daily . Benadryl PRN  We discussed:  Pt has been on fluoxetine for ~20 years since her daughter was in a car accident, she reports it was mainly for anxiety and reports minimal issues now. Discussed possibility of trial off fluoxetine, pt would like to continue medication.  Plan  Continue current medications  GERD   Patient has failed these meds in past: n/a Patient is currently controlled on the following medications:   . Omeprazole 20 mg daily PRN . OTC Pepcid Complete HS  We discussed:  Pt typically takes PPI 3-4 days per week; discussed long term risks associated with PPIs including bone loss; pt had a DEXA scan in 2017 that was normal  Plan  Continue current medications  Continue to monitor bone density as needed  Carpal tunnel   Patient has failed these meds in past: n/a Patient is currently controlled on the following medications:  . Meloxicam 7.5 mg QOD   We discussed:  Patient is satisfied with current regimen and denies issues  Plan  Continue current medications  Prediabetes   A1c goal <6.5%  Recent Relevant Labs: Lab Results  Component Value Date/Time   HGBA1C 5.5 05/07/2019 11:32 AM   HGBA1C 5.6 05/08/2018 10:47 AM   GFR 66.80 05/07/2019 11:32 AM   GFR 67.92 05/08/2018 10:47 AM    Last diabetic Eye exam: No results found for: HMDIABEYEEXA  Last diabetic Foot exam: No results found for: HMDIABFOOTEX   No medication indicated.  We discussed: diet and exercise extensively  Plan  Continue control with diet and exercise  Health Maintenance   Patient is currently controlled on the following medications:  . Multivitamin . Calcium carbonate . Vitamin D  We discussed:  Patient is satisfied with current regimen and denies issues   Plan  Continue current medications   Medication Management   [facility] Uses pill box? Yes Pt endorses 100% compliance  We discussed: Current pharmacy is preferred with insurance plan and patient is satisfied with pharmacy services; pt will be switching to [employer] Part D insurance in January, [facility] still [facility].  Plan  Continue current medication management strategy    Follow up: 6 month phone visit   , PharmD, BCACP Clinical Pharmacist [facility] [phone]     

## 2020-01-29 NOTE — Patient Instructions (Addendum)
Visit Information  Phone number for Pharmacist: [phone]  Thank you for meeting with me to discuss your medications! I look forward to working with you to achieve your health care goals. Below is a summary of what we talked about during the visit:  Goals Addressed            This Visit's Progress   . Pharmacy Care Plan       CARE PLAN ENTRY (see longitudinal plan of care for additional care plan information)  Current Barriers:  . Chronic Disease Management support, education, and care coordination needs related to Hyperlipidemia, Depression, and Anxiety, GERD, Prediabetes  Hyperlipidemia Lab Results  Component Value Date/Time   LDLCALC 74 05/07/2019 11:32 AM   LDLCALC 95 11/07/2014 09:33 AM .  Pharmacist Clinical Goal(s): o Over the next 180 days, patient will work with PharmD and providers to maintain LDL goal < 100 . Current regimen:  o Atorvastatin 20 mg daily . Interventions: o Discussed cholesterol goals and benefits of medications for prevention of heart attack / stroke . Patient self care activities - Over the next 180 days, patient will: o Continue medication as prescribed  Prediabetes Lab Results  Component Value Date/Time   HGBA1C 5.5 05/07/2019 11:32 AM   HGBA1C 5.6 05/08/2018 10:47 AM .  Pharmacist Clinical Goal(s): o Over the next 180 days, patient will work with PharmD and providers to maintain A1c goal <6.5% . Current regimen:  o No medication . Interventions: o Discussed importance of maintaining sugars at goal to prevent complications of diabetes including kidney damage, retinal damage, and cardiovascular disease . Patient self care activities - Over the next 180 days, patient will: o Continue control with diet and exercise  Depression / Anxiety / Sleep issues . Pharmacist Clinical Goal(s) o Over the next 180 days, patient will work with PharmD and providers to optimize therapy . Current regimen:  o Fluoxetine 20 mg  daily . Interventions: o Discussed possibility of trial off SSRI if symptoms are well controlled; pt will continue medication for now . Patient self care activities - Over the next 180 days, patient will: o Continue medication as prescribed  GERD . Pharmacist Clinical Goal(s) o Over the next 180 days, patient will work with PharmD and providers to optimize therapy . Current regimen:  o Omeprazole 20 mg daily as needed o OTC Pepcid complete at bedtime . Interventions: o Discussed long term risk associated with PPIs including bone loss . Patient self care activities - Over the next 180 days, patient will: o Continue medication as directed  Medication management . Pharmacist Clinical Goal(s): o Over the next 180 days, patient will work with PharmD and providers to maintain optimal medication adherence . [facility] . Interventions o Comprehensive medication review performed. o Continue current medication management strategy . Patient self care activities - Over the next 180 days, patient will: o [facility] o Take medications as prescribed o Report any questions or concerns to PharmD and/or provider(s)  Initial goal documentation      Ms. Virrueta was given information about Chronic Care Management services today including:  1. CCM service includes personalized support from designated clinical staff supervised by her physician, including individualized plan of care and coordination with other care providers 2. 24/7 contact phone numbers for assistance for urgent and routine care needs. 3. Standard insurance, coinsurance, copays and deductibles apply for chronic care management only during months in which we provide at least 20 minutes of these  services. Most insurances cover these services at 100%, however patients may be responsible for any copay, coinsurance and/or deductible if applicable. This service may help you avoid the need for more expensive  face-to-face services. 4. Only one practitioner may furnish and bill the service in a calendar month. 5. The patient may stop CCM services at any time (effective at the end of the month) by phone call to the office staff.  Patient agreed to services and verbal consent obtained.   The patient verbalized understanding of instructions, educational materials, and care plan provided today and agreed to receive a mailed copy of patient instructions, educational materials, and care plan.  Telephone follow up appointment with pharmacy team member scheduled for: 6 months  Charlene Brooke, PharmD, BCACP Clinical Pharmacist [facility] [phone]  [place] stands for "Dietary Approaches to Stop Hypertension." The DASH eating plan is a healthy eating plan that has been shown to reduce high blood pressure (hypertension). It may also reduce your risk for type 2 diabetes, heart disease, and stroke. The DASH eating plan may also help with weight loss. What are tips for following this plan?  General guidelines  Avoid eating more than 2,300 mg (milligrams) of salt (sodium) a day. If you have hypertension, you may need to reduce your sodium intake to 1,500 mg a day.  Limit alcohol intake to no more than 1 drink a day for nonpregnant women and 2 drinks a day for men. One drink equals 12 oz of beer, 5 oz of wine, or 1 oz of hard liquor.  Work with your health care provider to maintain a healthy body weight or to lose weight. Ask what an ideal weight is for you.  Get at least 30 minutes of exercise that causes your heart to beat faster (aerobic exercise) most days of the week. Activities may include walking, swimming, or biking.  Work with your health care provider or diet and nutrition specialist (dietitian) to adjust your eating plan to your individual calorie needs. Reading food labels   Check food labels for the amount of sodium per serving. Choose foods with  less than 5 percent of the Daily Value of sodium. Generally, foods with less than 300 mg of sodium per serving fit into this eating plan.  To find whole grains, look for the word "whole" as the first word in the ingredient list. Shopping  Buy products labeled as "low-sodium" or "no salt added."  Buy fresh foods. Avoid canned foods and premade or frozen meals. Cooking  Avoid adding salt when cooking. Use salt-free seasonings or herbs instead of table salt or sea salt. Check with your health care provider or pharmacist before using salt substitutes.  Do not fry foods. Cook foods using healthy methods such as baking, boiling, grilling, and broiling instead.  Cook with heart-healthy oils, such as olive, canola, soybean, or sunflower oil. Meal planning  Eat a balanced diet that includes: ? 5 or more servings of fruits and vegetables each day. At each meal, try to fill half of your plate with fruits and vegetables. ? Up to 6-8 servings of whole grains each day. ? Less than 6 oz of lean meat, poultry, or fish each day. A 3-oz serving of meat is about the same size as a deck of cards. One egg equals 1 oz. ? 2 servings of low-fat dairy each day. ? A serving of nuts, seeds, or beans 5 times each week. ? Heart-healthy fats. Healthy fats  called Omega-3 fatty acids are found in foods such as flaxseeds and coldwater fish, like sardines, salmon, and mackerel.  Limit how much you eat of the following: ? Canned or prepackaged foods. ? Food that is high in trans fat, such as fried foods. ? Food that is high in saturated fat, such as fatty meat. ? Sweets, desserts, sugary drinks, and other foods with added sugar. ? Full-fat dairy products.  Do not salt foods before eating.  Try to eat at least 2 vegetarian meals each week.  Eat more home-cooked food and less restaurant, buffet, and fast food.  When eating at a restaurant, ask that your food be prepared with less salt or no salt, if possible. What  foods are recommended? The items listed may not be a complete list. Talk with your dietitian about what dietary choices are best for you. Grains Whole-grain or whole-wheat bread. Whole-grain or whole-wheat pasta. Brown rice. [place]. Bulgur. Whole-grain and low-sodium cereals. Pita bread. Low-fat, low-sodium crackers. Whole-wheat flour tortillas. Vegetables Fresh or frozen vegetables (raw, steamed, roasted, or grilled). Low-sodium or reduced-sodium tomato and vegetable juice. Low-sodium or reduced-sodium tomato sauce and tomato paste. Low-sodium or reduced-sodium canned vegetables. Fruits All fresh, dried, or frozen fruit. Canned fruit in natural juice (without added sugar). Meat and other protein foods Skinless chicken or [place]. Ground chicken or [place]. Pork with fat trimmed off. Fish and seafood. Egg whites. Dried beans, peas, or lentils. Unsalted nuts, nut butters, and seeds. Unsalted canned beans. Lean cuts of beef with fat trimmed off. Low-sodium, lean deli meat. Dairy Low-fat (1%) or fat-free (skim) milk. Fat-free, low-fat, or reduced-fat cheeses. Nonfat, low-sodium ricotta or cottage cheese. Low-fat or nonfat yogurt. Low-fat, low-sodium cheese. Fats and oils Soft margarine without trans fats. Vegetable oil. Low-fat, reduced-fat, or light mayonnaise and salad dressings (reduced-sodium). Canola, safflower, olive, soybean, and sunflower oils. Avocado. Seasoning and other foods Herbs. Spices. Seasoning mixes without salt. Unsalted popcorn and pretzels. Fat-free sweets. What foods are not recommended? The items listed may not be a complete list. Talk with your dietitian about what dietary choices are best for you. Grains Baked goods made with fat, such as croissants, muffins, or some breads. Dry pasta or rice meal packs. Vegetables Creamed or fried vegetables. Vegetables in a cheese sauce. Regular canned vegetables (not low-sodium or reduced-sodium). Regular canned tomato sauce and  paste (not low-sodium or reduced-sodium). Regular tomato and vegetable juice (not low-sodium or reduced-sodium). Angie Fava. Olives. Fruits Canned fruit in a light or heavy syrup. Fried fruit. Fruit in cream or butter sauce. Meat and other protein foods Fatty cuts of meat. Ribs. Fried meat. Berniece Salines. Sausage. Bologna and other processed lunch meats. Salami. Fatback. Hotdogs. Bratwurst. Salted nuts and seeds. Canned beans with added salt. Canned or smoked fish. Whole eggs or egg yolks. Chicken or [place] with skin. Dairy Whole or 2% milk, cream, and half-and-half. Whole or full-fat cream cheese. Whole-fat or sweetened yogurt. Full-fat cheese. Nondairy creamers. Whipped toppings. Processed cheese and cheese spreads. Fats and oils Butter. Stick margarine. Lard. Shortening. Ghee. Bacon fat. Tropical oils, such as coconut, palm kernel, or palm oil. Seasoning and other foods Salted popcorn and pretzels. Onion salt, garlic salt, seasoned salt, table salt, and sea salt. Worcestershire sauce. Tartar sauce. Barbecue sauce. Teriyaki sauce. Soy sauce, including reduced-sodium. Steak sauce. Canned and packaged gravies. Fish sauce. Oyster sauce. Cocktail sauce. Horseradish that you find on the shelf. Ketchup. Mustard. Meat flavorings and tenderizers. Bouillon cubes. Hot sauce and Tabasco sauce. Premade or packaged marinades.  Premade or packaged taco seasonings. Relishes. Regular salad dressings. Where to find more information:  National Heart, Lung, and [place]: https://wilson-eaton.com/  American Heart Association: www.heart.org Summary  The DASH eating plan is a healthy eating plan that has been shown to reduce high blood pressure (hypertension). It may also reduce your risk for type 2 diabetes, heart disease, and stroke.  With the DASH eating plan, you should limit salt (sodium) intake to 2,300 mg a day. If you have hypertension, you may need to reduce your sodium intake to 1,500 mg a day.  When on the DASH  eating plan, aim to eat more fresh fruits and vegetables, whole grains, lean proteins, low-fat dairy, and heart-healthy fats.  Work with your health care provider or diet and nutrition specialist (dietitian) to adjust your eating plan to your individual calorie needs. This information is not intended to replace advice given to you by your health care provider. Make sure you discuss any questions you have with your health care provider. Document Revised: 02/10/2017 Document Reviewed: 02/22/2016 Elsevier Patient Education  2020 [employer].

## 2020-01-31 ENCOUNTER — Other Ambulatory Visit: Payer: Self-pay

## 2020-01-31 DIAGNOSIS — R7303 Prediabetes: Secondary | ICD-10-CM

## 2020-02-11 ENCOUNTER — Telehealth: Payer: Self-pay | Admitting: Pharmacist

## 2020-02-11 NOTE — Progress Notes (Addendum)
Chart review for chronic care management  Reviewed chart and insurance data for medication adherence. Patient does not have any gaps in adherence and is not in danger of failing any Medicare adherence measures. No further action required.  Wendy Poet, Clinical Pharmacist [facility]

## 2020-04-20 ENCOUNTER — Other Ambulatory Visit: Payer: Medicare Other | Admitting: *Deleted

## 2020-04-20 ENCOUNTER — Other Ambulatory Visit: Payer: Self-pay

## 2020-04-20 DIAGNOSIS — E785 Hyperlipidemia, unspecified: Secondary | ICD-10-CM | POA: Diagnosis not present

## 2020-04-20 LAB — LIPID PANEL
Chol/HDL Ratio: 2.3 ratio (ref 0.0–4.4)
Cholesterol, Total: 126 mg/dL (ref 100–199)
HDL: 56 mg/dL (ref >39)
LDL Chol Calc (NIH): 53 mg/dL (ref 0–99)
Triglycerides: 92 mg/dL (ref 0–149)
VLDL Cholesterol Cal: 17 mg/dL (ref 5–40)

## 2020-04-20 LAB — ALT: ALT: 26 IU/L (ref 0–32)

## 2020-05-12 NOTE — Patient Instructions (Addendum)
Pneumovax immunization administered today.     Medications changes include :   none    Please followup in 6 months

## 2020-05-12 NOTE — Progress Notes (Signed)
Subjective:    Patient ID: Victoria Stone, female    DOB: 01/15/1951, 70 y.o.   MRN: [id number]  HPI The patient is here for follow up of their chronic medical problems, including prediabetes, GERD, depression, anxiety, hyperlipidemia, knee arthritis, sleep difficulties  She is exercising regularly-walking.  Overall she feels well and has no concerns.  Medications and allergies reviewed with patient and updated if appropriate.  Patient Active Problem List   Diagnosis Date Noted  . Sleep difficulties 11/06/2019  . Prediabetes 04/06/2017  . Carpal tunnel syndrome on right 04/04/2017  . Family history of premature CAD 07/20/2016  . GERD (gastroesophageal reflux disease) 02/08/2016  . Chest pain 07/15/2015  . Dyslipidemia   . Adjustment disorder with mixed anxiety and depressed mood 11/20/2008  . ALLERGIC RHINITIS 11/20/2008  . Ulcerative colitis ([place]) 11/20/2008  . Arthritis of knee 11/20/2008    Current Outpatient Medications on File Prior to Visit  Medication Sig Dispense Refill  . atorvastatin (LIPITOR) 20 MG tablet Take 1 tablet (20 mg total) by mouth daily. 90 tablet 1  . Calcium Carbonate (CALCIUM 600 PO) Take by mouth daily.    Marland Kitchen FLUoxetine (PROZAC) 20 MG capsule TAKE 1 CAPSULE BY MOUTH EVERY DAY 90 capsule 1  . meloxicam (MOBIC) 7.5 MG tablet TAKE 1 TABLET (7.5 MG TOTAL) BY MOUTH EVERY OTHER DAY. 45 tablet 1  . Multiple Vitamins-Minerals (MULTIVITAMIN) tablet Take 1 tablet by mouth daily.    Marland Kitchen omeprazole (PRILOSEC) 20 MG capsule Take 1 capsule (20 mg total) by mouth daily. 90 capsule 1  . triamcinolone ointment (KENALOG) 0.5 % Apply 1 application topically 2 (two) times daily as needed (foot rash). 30 g 1   No current facility-administered medications on file prior to visit.    Past Medical History:  Diagnosis Date  . ALLERGIC RHINITIS   . ARTHRITIS   . Arthritis of knee 11/20/2008  . Carpal tunnel syndrome on right 04/04/2017  . Chest pain 07/15/2015  .  Dyslipidemia   . Family history of premature coronary artery disease 07/15/2015  . GERD (gastroesophageal reflux disease) 02/08/2016  . Obese   . Prediabetes 04/06/2017  . ULCERATIVE COLITIS   . Ulcerative colitis ([place]) 11/20/2008    Past Surgical History:  Procedure Laterality Date  . ABDOMINAL HYSTERECTOMY  2001  . BONE SPUR    . BREAST SURGERY  1988   Reduction  . CARPAL TUNNEL RELEASE Right 12/31/2018   Procedure: RIGHT CARPAL TUNNEL RELEASE;  Surgeon: Leanora Cover, MD;  Location: [facility] [facility] [facility];  Service: Orthopedics;  Laterality: Right;  . FOOT SURGERY  90, 04, 09   X'S 3  . HAMMER TOE SURGERY    . LAPAROSCOPIC GASTRIC BANDING  2005  . TUBAL LIGATION  1988    Social History   Socioeconomic History  . Marital status: Married    Spouse name: Not on file  . Number of children: 2  . Years of education: Not on file  . Highest education level: Not on file  Occupational History  . Occupation: Programmer, multimedia: [employer]  Tobacco Use  . Smoking status: Never Smoker  . Smokeless tobacco: Never Used  . Tobacco comment: Married, 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[employer]  Vaping Use  . Vaping Use: Never used  Substance and Sexual Activity  . Alcohol use: No    Comment: occasionally  . Drug use: No  . Sexual activity: Not on file  Other Topics Concern  .  Not on file  Social History Narrative   RN at [employer]   Married, lives with spouse   Social Determinants of Health   Financial Resource Strain: Low Risk   . Difficulty of Paying Living Expenses: Not hard at all  Food Insecurity: No Food Insecurity  . Worried About Charity fundraiser in the Last Year: Never true  . Ran Out of Food in the Last Year: Never true  Transportation Needs: No Transportation Needs  . Lack of Transportation (Medical): No  . Lack of Transportation (Non-Medical): No  Physical Activity: Sufficiently Active  . Days of Exercise per Week: 5 days  . Minutes of Exercise per  Session: 30 min  Stress: No Stress Concern Present  . Feeling of Stress : Not at all  Social Connections: Socially Integrated  . Frequency of Communication with Friends and Family: More than three times a week  . Frequency of Social Gatherings with Friends and Family: More than three times a week  . Attends Religious Services: More than 4 times per year  . Active Member of Clubs or Organizations: Yes  . Attends Archivist Meetings: More than 4 times per year  . Marital Status: Married    Family History  Problem Relation Age of Onset  . Hypertension Mother   . Lymphoma Mother   . Other Mother        PACEMAKER  . Heart disease Mother   . Heart attack Mother   . Hypertension Father   . Heart disease Father   . Heart attack Father   . Hypertension Sister   . Diabetes Sister   . Heart attack Sister   . Heart disease Sister   . Heart disease Brother   . Breast cancer Brother   . Breast cancer Sister   . Hypertension Maternal Grandmother   . Hypertension Paternal Grandmother   . Hypertension Paternal Grandfather     Review of Systems  Constitutional: Negative for chills and fever.  HENT: Negative for trouble swallowing.   Respiratory: Negative for cough, shortness of breath and wheezing.   Cardiovascular: Negative for chest pain, palpitations and leg swelling.  Gastrointestinal: Negative for abdominal pain and nausea.  Neurological: Negative for light-headedness and headaches.       Objective:   Vitals:   05/13/20 1000  BP: 126/80  Pulse: 82  Temp: 98.1 F (36.7 C)  SpO2: 98%   BP Readings from Last 3 Encounters:  05/13/20 126/80  01/17/20 118/60  11/07/19 126/76   Wt Readings from Last 3 Encounters:  05/13/20 148 lb (67.1 kg)  01/17/20 147 lb 12.8 oz (67 kg)  11/07/19 142 lb (64.4 kg)   Body mass index is 24.63 kg/m.   Physical Exam    Constitutional: Appears well-developed and well-nourished. No distress.  HENT:  Head: Normocephalic and  atraumatic.  Neck: Neck supple. No tracheal deviation present. No thyromegaly present.  No cervical lymphadenopathy Cardiovascular: Normal rate, regular rhythm and normal heart sounds.   No murmur heard. No carotid bruit .  No edema Pulmonary/Chest: Effort normal and breath sounds normal. No respiratory distress. No has no wheezes. No rales.  Skin: Skin is warm and dry. Not diaphoretic.  Psychiatric: Normal mood and affect. Behavior is normal.      Assessment & Plan:    See Problem List for Assessment and Plan of chronic medical problems.    This visit occurred during the SARS-CoV-2 public health emergency.  Safety protocols were in place, including  screening questions prior to the visit, additional usage of staff PPE, and extensive cleaning of exam room while observing appropriate contact time as indicated for disinfecting solutions.

## 2020-05-13 ENCOUNTER — Encounter: Payer: Self-pay | Admitting: Internal Medicine

## 2020-05-13 ENCOUNTER — Ambulatory Visit (INDEPENDENT_AMBULATORY_CARE_PROVIDER_SITE_OTHER): Payer: Medicare Other | Admitting: Internal Medicine

## 2020-05-13 ENCOUNTER — Other Ambulatory Visit: Payer: Self-pay

## 2020-05-13 VITALS — BP 126/80 | HR 82 | Temp 98.1°F | Ht 65.0 in | Wt 148.0 lb

## 2020-05-13 DIAGNOSIS — K219 Gastro-esophageal reflux disease without esophagitis: Secondary | ICD-10-CM

## 2020-05-13 DIAGNOSIS — Z23 Encounter for immunization: Secondary | ICD-10-CM | POA: Diagnosis not present

## 2020-05-13 DIAGNOSIS — E785 Hyperlipidemia, unspecified: Secondary | ICD-10-CM | POA: Diagnosis not present

## 2020-05-13 DIAGNOSIS — G479 Sleep disorder, unspecified: Secondary | ICD-10-CM | POA: Diagnosis not present

## 2020-05-13 DIAGNOSIS — R7303 Prediabetes: Secondary | ICD-10-CM

## 2020-05-13 DIAGNOSIS — F4323 Adjustment disorder with mixed anxiety and depressed mood: Secondary | ICD-10-CM

## 2020-05-13 DIAGNOSIS — M1710 Unilateral primary osteoarthritis, unspecified knee: Secondary | ICD-10-CM

## 2020-05-13 NOTE — Assessment & Plan Note (Addendum)
Chronic Controlled, stable Continue mobic 7.5 mg every other day Continue walking

## 2020-05-13 NOTE — Addendum Note (Signed)
Addended by: Marcina Millard on: 05/13/2020 04:21 PM   Modules accepted: Orders

## 2020-05-13 NOTE — Assessment & Plan Note (Addendum)
Chronic Low sugar / carb diet Stressed regular exercise

## 2020-05-13 NOTE — Assessment & Plan Note (Addendum)
Chronic Lipid panel has been well controlled Continue atorvastatin 20 mg daily Regular exercise and healthy diet encouraged'

## 2020-05-13 NOTE — Assessment & Plan Note (Signed)
Chronic, intermittent Manageable Not currently taking any medication

## 2020-05-13 NOTE — Assessment & Plan Note (Addendum)
Chronic GERD controlled Continue omeprazole 20 mg every other day

## 2020-05-13 NOTE — Assessment & Plan Note (Signed)
Chronic Controlled, stable Continue prozac 20 mg daily

## 2020-05-17 ENCOUNTER — Other Ambulatory Visit: Payer: Self-pay | Admitting: Internal Medicine

## 2020-06-08 ENCOUNTER — Other Ambulatory Visit: Payer: Self-pay | Admitting: Internal Medicine

## 2020-06-12 ENCOUNTER — Other Ambulatory Visit: Payer: Self-pay

## 2020-06-13 DIAGNOSIS — Z23 Encounter for immunization: Secondary | ICD-10-CM | POA: Diagnosis not present

## 2020-07-20 DIAGNOSIS — U071 COVID-19: Secondary | ICD-10-CM | POA: Diagnosis not present

## 2020-07-22 ENCOUNTER — Telehealth: Payer: Self-pay | Admitting: Internal Medicine

## 2020-07-22 NOTE — Telephone Encounter (Signed)
Yes, she is due this year for tetanus.  Please schedule to get Tdap

## 2020-07-22 NOTE — Telephone Encounter (Signed)
   Patient calling to report she recently had a injury to her arm with a drill. Drill fell off shelf, drill bit went into arm.  The area is healing well Should patient get tetanus?  Please advise

## 2020-07-23 ENCOUNTER — Other Ambulatory Visit: Payer: Self-pay

## 2020-07-23 ENCOUNTER — Ambulatory Visit (INDEPENDENT_AMBULATORY_CARE_PROVIDER_SITE_OTHER): Payer: Medicare Other

## 2020-07-23 DIAGNOSIS — Z23 Encounter for immunization: Secondary | ICD-10-CM | POA: Diagnosis not present

## 2020-07-23 DIAGNOSIS — T1490XA Injury, unspecified, initial encounter: Secondary | ICD-10-CM | POA: Diagnosis not present

## 2020-07-23 NOTE — Progress Notes (Signed)
Per PCP, pt getting Tdap due to injury sustained with a drill in the arm.   Tdap given in left deltoid & pt tolerated well.

## 2020-07-28 ENCOUNTER — Telehealth: Payer: Medicare Other

## 2020-09-02 DIAGNOSIS — E785 Hyperlipidemia, unspecified: Secondary | ICD-10-CM

## 2020-09-02 DIAGNOSIS — R7303 Prediabetes: Secondary | ICD-10-CM

## 2020-09-11 ENCOUNTER — Ambulatory Visit: Payer: Medicare Other

## 2020-09-17 ENCOUNTER — Telehealth: Payer: Self-pay | Admitting: Pharmacist

## 2020-09-18 NOTE — Progress Notes (Signed)
    Chronic Care Management Pharmacy Assistant   Name: Victoria Stone  MRN: [id number] DOB: 01-24-1951   Reason for Encounter: Disease State   Conditions to be addressed/monitored: General   Recent office visits:  05/13/20 Dr. Quay Burow Internal Medicine (Reflux) no medication changes  Recent consult visits:  None ID  Hospital visits:  None in previous 6 months  Medications: Outpatient Encounter Medications as of 09/17/2020  Medication Sig   atorvastatin (LIPITOR) 20 MG tablet Take 1 tablet (20 mg total) by mouth daily.   Calcium Carbonate (CALCIUM 600 PO) Take by mouth daily.   FLUoxetine (PROZAC) 20 MG capsule TAKE 1 CAPSULE BY MOUTH EVERY DAY   meloxicam (MOBIC) 7.5 MG tablet TAKE 1 TABLET (7.5 MG TOTAL) BY MOUTH EVERY OTHER DAY.   Multiple Vitamins-Minerals (MULTIVITAMIN) tablet Take 1 tablet by mouth daily.   omeprazole (PRILOSEC) 20 MG capsule TAKE 1 CAPSULE BY MOUTH EVERY DAY   triamcinolone ointment (KENALOG) 0.5 % Apply 1 application topically 2 (two) times daily as needed (foot rash).   No facility-administered encounter medications on file as of 09/17/2020.   Pharmacist Review  Have you had any problems recently with your health? Patient states that she has not had any new issues since last seeing Dr. Quay Burow. Also has not seen any outside providers  Have you had any problems with your pharmacy? Patient states that she is not having any problems with getting her medications or the cost of medications from the pharmacy  What issues or side effects are you having with your medications? Patient states that she is not having any side effects from any medications  What would you like me to pass along to [facility] for them to help you with?  Patient states that she is doing well and does not have any concerns about her health or medications at this time  What can we do to take care of you better?   Patient states that she is well and if any changes she will  contact Dr. Quay Burow office   Ethelene Hal Clinical Pharmacist Assistant [phone]   Time spent:29

## 2020-09-24 ENCOUNTER — Ambulatory Visit (INDEPENDENT_AMBULATORY_CARE_PROVIDER_SITE_OTHER): Payer: Medicare Other

## 2020-09-24 VITALS — BP 120/78 | HR 70 | Temp 98.3°F | Ht 65.0 in | Wt 150.2 lb

## 2020-09-24 DIAGNOSIS — Z0000 Encounter for general adult medical examination without abnormal findings: Secondary | ICD-10-CM | POA: Diagnosis not present

## 2020-09-24 NOTE — Patient Instructions (Signed)
Victoria Stone , Thank you for taking time to come for your Medicare Wellness Visit. I appreciate your ongoing commitment to your health goals. Please review the following plan we discussed and let me know if I can assist you in the future.   Screening recommendations/referrals: Colonoscopy: last done 02/14/2011; due every 10 years Mammogram: last done 07/02/2019; due every year Bone Density: last done 05/24/2018; due every 5 years Recommended yearly ophthalmology/optometry visit for glaucoma screening and checkup Recommended yearly dental visit for hygiene and checkup  Vaccinations: Influenza vaccine: 12/19/2019 Pneumococcal vaccine: 10/31/2017, 05/13/2020 Tdap vaccine: 07/23/2020; due every 10 years Shingles vaccine: 06/08/2017, 04/05/2017   Covid-19: 04/03/2019, 04/24/2019, 11/26/2019, 06/13/2020  Advanced directives: Please bring a copy of your health care power of attorney and living will to the office at your convenience.  Conditions/risks identified: Client understands the importance of follow-up with providers by attending scheduled visits and discussed goals to eat healthier, increase physical activity, exercise the brain, socialize more,  get enough rest and make time for laughter.  Next appointment: Please schedule your next Medicare Wellness Visit with your Nurse Health Advisor in 1 year by calling [phone].   Preventive Care 58 Years and Older, Female Preventive care refers to lifestyle choices and visits with your health care provider that can promote health and wellness. What does preventive care include? A yearly physical exam. This is also called an annual well check. Dental exams once or twice a year. Routine eye exams. Ask your health care provider how often you should have your eyes checked. Personal lifestyle choices, including: Daily care of your teeth and gums. Regular physical activity. Eating a healthy diet. Avoiding tobacco and drug use. Limiting alcohol use. Practicing  safe sex. Taking low-dose aspirin every day. Taking vitamin and mineral supplements as recommended by your health care provider. What happens during an annual well check? The services and screenings done by your health care provider during your annual well check will depend on your age, overall health, lifestyle risk factors, and family history of disease. Counseling  Your health care provider may ask you questions about your: Alcohol use. Tobacco use. Drug use. Emotional well-being. Home and relationship well-being. Sexual activity. Eating habits. History of falls. Memory and ability to understand (cognition). Work and work Statistician. Reproductive health. Screening  You may have the following tests or measurements: Height, weight, and BMI. Blood pressure. Lipid and cholesterol levels. These may be checked every 5 years, or more frequently if you are over 73 years old. Skin check. Lung cancer screening. You may have this screening every year starting at age 26 if you have a 30-pack-year history of smoking and currently smoke or have quit within the past 15 years. Fecal occult blood test (FOBT) of the stool. You may have this test every year starting at age 77. Flexible sigmoidoscopy or colonoscopy. You may have a sigmoidoscopy every 5 years or a colonoscopy every 10 years starting at age 63. Hepatitis C blood test. Hepatitis B blood test. Sexually transmitted disease (STD) testing. Diabetes screening. This is done by checking your blood sugar (glucose) after you have not eaten for a while (fasting). You may have this done every 1-3 years. Bone density scan. This is done to screen for osteoporosis. You may have this done starting at age 38. Mammogram. This may be done every 1-2 years. Talk to your health care provider about how often you should have regular mammograms. Talk with your health care provider about your test results, treatment options, and  if necessary, the need for more  tests. Vaccines  Your health care provider may recommend certain vaccines, such as: Influenza vaccine. This is recommended every year. Tetanus, diphtheria, and acellular pertussis (Tdap, Td) vaccine. You may need a Td booster every 10 years. Zoster vaccine. You may need this after age 47. Pneumococcal 13-valent conjugate (PCV13) vaccine. One dose is recommended after age 11. Pneumococcal polysaccharide (PPSV23) vaccine. One dose is recommended after age 40. Talk to your health care provider about which screenings and vaccines you need and how often you need them. This information is not intended to replace advice given to you by your health care provider. Make sure you discuss any questions you have with your health care provider. Document Released: 03/27/2015 Document Revised: 11/18/2015 Document Reviewed: 12/30/2014 Elsevier Interactive Patient Education  2017 [place] Prevention in the Home Falls can cause injuries. They can happen to people of all ages. There are many things you can do to make your home safe and to help prevent falls. What can I do on the outside of my home? Regularly fix the edges of walkways and driveways and fix any cracks. Remove anything that might make you trip as you walk through a door, such as a raised step or threshold. Trim any bushes or trees on the path to your home. Use bright outdoor lighting. Clear any walking paths of anything that might make someone trip, such as rocks or tools. Regularly check to see if handrails are loose or broken. Make sure that both sides of any steps have handrails. Any raised decks and porches should have guardrails on the edges. Have any leaves, snow, or ice cleared regularly. Use sand or salt on walking paths during winter. Clean up any spills in your garage right away. This includes oil or grease spills. What can I do in the bathroom? Use night lights. Install grab bars by the toilet and in the tub and shower.  Do not use towel bars as grab bars. Use non-skid mats or decals in the tub or shower. If you need to sit down in the shower, use a plastic, non-slip stool. Keep the floor dry. Clean up any water that spills on the floor as soon as it happens. Remove soap buildup in the tub or shower regularly. Attach bath mats securely with double-sided non-slip rug tape. Do not have throw rugs and other things on the floor that can make you trip. What can I do in the bedroom? Use night lights. Make sure that you have a light by your bed that is easy to reach. Do not use any sheets or blankets that are too big for your bed. They should not hang down onto the floor. Have a firm chair that has side arms. You can use this for support while you get dressed. Do not have throw rugs and other things on the floor that can make you trip. What can I do in the kitchen? Clean up any spills right away. Avoid walking on wet floors. Keep items that you use a lot in easy-to-reach places. If you need to reach something above you, use a strong step stool that has a grab bar. Keep electrical cords out of the way. Do not use floor polish or wax that makes floors slippery. If you must use wax, use non-skid floor wax. Do not have throw rugs and other things on the floor that can make you trip. What can I do with my stairs? Do not leave any  items on the stairs. Make sure that there are handrails on both sides of the stairs and use them. Fix handrails that are broken or loose. Make sure that handrails are as long as the stairways. Check any carpeting to make sure that it is firmly attached to the stairs. Fix any carpet that is loose or worn. Avoid having throw rugs at the top or bottom of the stairs. If you do have throw rugs, attach them to the floor with carpet tape. Make sure that you have a light switch at the top of the stairs and the bottom of the stairs. If you do not have them, ask someone to add them for you. What else  can I do to help prevent falls? Wear shoes that: Do not have high heels. Have rubber bottoms. Are comfortable and fit you well. Are closed at the toe. Do not wear sandals. If you use a stepladder: Make sure that it is fully opened. Do not climb a closed stepladder. Make sure that both sides of the stepladder are locked into place. Ask someone to hold it for you, if possible. Clearly mark and make sure that you can see: Any grab bars or handrails. First and last steps. Where the edge of each step is. Use tools that help you move around (mobility aids) if they are needed. These include: Canes. Walkers. Scooters. Crutches. Turn on the lights when you go into a dark area. Replace any light bulbs as soon as they burn out. Set up your furniture so you have a clear path. Avoid moving your furniture around. If any of your floors are uneven, fix them. If there are any pets around you, be aware of where they are. Review your medicines with your doctor. Some medicines can make you feel dizzy. This can increase your chance of falling. Ask your doctor what other things that you can do to help prevent falls. This information is not intended to replace advice given to you by your health care provider. Make sure you discuss any questions you have with your health care provider. Document Released: 12/25/2008 Document Revised: 08/06/2015 Document Reviewed: 04/04/2014 Elsevier Interactive Patient Education  2017 [employer].

## 2020-09-24 NOTE — Progress Notes (Signed)
Subjective:   Victoria Stone is a 70 y.o. female who presents for Medicare Annual (Subsequent) preventive examination.  Review of Systems     Cardiac Risk Factors include: advanced age (>75mn, >>59women);dyslipidemia;family history of premature cardiovascular disease     Objective:    Today's Vitals   09/24/20 1030 09/24/20 1031  BP:  120/78  Pulse:  70  Temp:  98.3 F (36.8 C)  SpO2:  97%  Weight:  150 lb 3.2 oz (68.1 kg)  Height:  5' 5"  (1.651 m)  PainSc: 0-No pain 0-No pain   Body mass index is 24.99 kg/m.  Advanced Directives 09/24/2020 09/11/2019 12/31/2018 12/24/2018 09/24/2015  Does Patient Have a Medical Advance Directive? Yes Yes Yes Yes Yes  Type of AParamedicof [place]oraLiving will [place]on[place]Living will HH[place]Living will HWakefieldOut of facility DNR (pink MOST or yellow form) -  Does patient want to make changes to medical advance directive? No - Patient declined No - Patient declined No - Patient declined - -  Copy of [place] Chart? No - copy requested No - copy requested - - -    Current Medications (verified) Outpatient Encounter Medications as of 09/24/2020  Medication Sig   atorvastatin (LIPITOR) 20 MG tablet Take 1 tablet (20 mg total) by mouth daily.   Calcium Carbonate (CALCIUM 600 PO) Take by mouth daily.   FLUoxetine (PROZAC) 20 MG capsule TAKE 1 CAPSULE BY MOUTH EVERY DAY   meloxicam (MOBIC) 7.5 MG tablet TAKE 1 TABLET (7.5 MG TOTAL) BY MOUTH EVERY OTHER DAY.   Multiple Vitamins-Minerals (MULTIVITAMIN) tablet Take 1 tablet by mouth daily.   omeprazole (PRILOSEC) 20 MG capsule TAKE 1 CAPSULE BY MOUTH EVERY DAY   triamcinolone ointment (KENALOG) 0.5 % Apply 1 application topically 2 (two) times daily as needed (foot rash).   No facility-administered encounter medications on file as of 09/24/2020.    Allergies (verified) Penicillins and  Trazodone and nefazodone   History: Past Medical History:  Diagnosis Date   ALLERGIC RHINITIS    ARTHRITIS    Arthritis of knee 11/20/2008   Carpal tunnel syndrome on right 04/04/2017   Chest pain 07/15/2015   Dyslipidemia    Family history of premature coronary artery disease 07/15/2015   GERD (gastroesophageal reflux disease) 02/08/2016   Obese    Prediabetes 04/06/2017   ULCERATIVE COLITIS    Ulcerative colitis ([place] 11/20/2008   Past Surgical History:  Procedure Laterality Date   ABDOMINAL HYSTERECTOMY  2001   BONE SPUR     BREAST SURGERY  1988   Reduction   CARPAL TUNNEL RELEASE Right 12/31/2018   Procedure: RIGHT CARPAL TUNNEL RELEASE;  Surgeon: KLeanora Cover MD;  [facility] [facility]  Service: Orthopedics;  Laterality: Right;   FOOT SURGERY  90, 04, 09   X'S 3   HAMMER TOE SURGERY     LAPAROSCOPIC GASTRIC BANDING  2005   TUBAL LIGATION  1988   Family History  Problem Relation Age of Onset   Hypertension Mother    Lymphoma Mother    Other Mother        PACEMAKER   Heart disease Mother    Heart attack Mother    Hypertension Father    Heart disease Father    Heart attack Father    Hypertension Sister    Diabetes Sister    Heart attack Sister    Heart disease Sister  Heart disease Brother    Breast cancer Brother    Breast cancer Sister    Hypertension Maternal Grandmother    Hypertension Paternal Grandmother    Hypertension Paternal Grandfather    Social History   Socioeconomic History   Marital status: Married    Spouse name: Not on file   Number of children: 2   Years of education: Not on file   Highest education level: Not on file  Occupational History   Occupation: RN    Employer: [employer]  Tobacco Use   Smoking status: Never   Smokeless tobacco: Never   Tobacco comments:    Married, 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[facility] [employer]  Vaping Use   Vaping Use: Never used  Substance and Sexual Activity   Alcohol use: No    Comment:  occasionally   Drug use: No   Sexual activity: Not on file  Other Topics Concern   Not on file  Social History Narrative   RN at [facility]   Married, lives with spouse   Social Determinants of Health   Financial Resource Strain: Low Risk    Difficulty of Paying Living Expenses: Not hard at all  Food Insecurity: No Food Insecurity   Worried About Charity fundraiser in the Last Year: Never true   Arboriculturist in the Last Year: Never true  Transportation Needs: No Transportation Needs   Lack of Transportation (Medical): No   Lack of Transportation (Non-Medical): No  Physical Activity: Sufficiently Active   Days of Exercise per Week: 5 days   Minutes of Exercise per Session: 30 min  Stress: No Stress Concern Present   Feeling of Stress : Not at all  Social Connections: Socially Integrated   Frequency of Communication with Friends and Family: More than three times a week   Frequency of Social Gatherings with Friends and Family: More than three times a week   Attends Religious Services: More than 4 times per year   Active Member of [employer] or Organizations: Yes   Attends Music therapist: More than 4 times per year   Marital Status: Married    Tobacco Counseling Counseling given: Not Answered Tobacco comments: Married, 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[employer]   Clinical Intake:  Pre-visit preparation completed: Yes  Pain : No/denies pain Pain Score: 0-No pain     BMI - recorded: 24.99 Nutritional Status: BMI of 19-24  Normal Nutritional Risks: None Diabetes: No  How often do you need to have someone help you when you read instructions, pamphlets, or other written materials from your doctor or pharmacy?: 1 - Never What is the last grade level you completed in school?: Retired Therapist, sports from [employer]  Diabetic? no  Interpreter Needed?: No  Information entered by :: Lisette Abu, LPN   Activities of Daily Living In your present state of  health, do you have any difficulty performing the following activities: 09/24/2020  Hearing? N  Vision? N  Difficulty concentrating or making decisions? N  Walking or climbing stairs? N  Dressing or bathing? N  Doing errands, shopping? N  Preparing Food and eating ? N  Using the Toilet? N  In the past six months, have you accidently leaked urine? N  Do you have problems with loss of bowel control? N  Managing your Medications? N  Managing your Finances? N  Housekeeping or managing your Housekeeping? N  Some recent data might be hidden    Patient Care  Team: Binnie Rail, MD as PCP - General (Internal Medicine) Sueanne Margarita, MD as PCP - Cardiology (Cardiology) Larey Dresser, MD as Consulting Physician (Cardiology) Marylynn Pearson, MD as Consulting Physician (Obstetrics and Gynecology) Sable Feil, MD (Gastroenterology) Kennith Center, RD as Dietitian (Family Medicine) Charlton Haws, [facility] as Pharmacist (Pharmacist) Katy Apo, MD as Consulting Physician (Ophthalmology)  Indicate any recent Medical Services you may have received from other than [facility] providers in the past year (date may be approximate).     Assessment:   This is a routine wellness examination for Victoria Stone.  Hearing/Vision screen Hearing Screening - Comments:: Patient stated no hearing difficulty.  No hearing aids. Vision Screening - Comments:: Patient wears glasses for distance.  Eye exam done by Katy Apo, MD.  Dietary issues and exercise activities discussed: Current Exercise Habits: Home exercise routine, Type of exercise: strength training/weights;stretching;walking, Time (Minutes): 30, Frequency (Times/Week): 5, Weekly Exercise (Minutes/Week): 150, Intensity: Moderate, Exercise limited by: None identified   Goals Addressed   None   Depression Screen PHQ 2/9 Scores 09/24/2020 09/11/2019 05/08/2018 04/04/2017 09/24/2015  PHQ - 2 Score 0 0 0 0 0  PHQ- 9 Score - - 0 - -    Fall  Risk Fall Risk  09/24/2020 09/11/2019 05/08/2018 04/04/2017 09/24/2015  Falls in the past year? 0 0 0 No No  Number falls in past yr: 0 0 - - -  Injury with Fall? 0 0 - - -  Risk for fall due to : No Fall Risks No Fall Risks - - -  Follow up Falls evaluation completed Falls evaluation completed - - -    FALL RISK PREVENTION PERTAINING TO THE HOME:  Any stairs in or around the home? Yes  If so, are there any without handrails? No  Home free of loose throw rugs in walkways, pet beds, electrical cords, etc? Yes  Adequate lighting in your home to reduce risk of falls? Yes   ASSISTIVE DEVICES UTILIZED TO PREVENT FALLS:  Life alert? No  Use of a cane, walker or w/c? No  Grab bars in the bathroom? Yes  Shower chair or bench in shower? Yes  Elevated toilet seat or a handicapped toilet? Yes   TIMED UP AND GO:  Was the test performed? Yes .  Length of time to ambulate 10 feet: 6 sec.   Gait steady and fast without use of assistive device  Cognitive Function: Normal cognitive status assessed by direct observation by this Nurse Health Advisor. No abnormalities found.   MMSE - Mini Mental State Exam 09/11/2019  Not completed: Refused     6CIT Screen 09/11/2019  What Year? 0 points  What month? 0 points  What time? 0 points  Count back from 20 0 points  Months in reverse 0 points  Repeat phrase 0 points  Total Score 0    Immunizations Immunization History  Administered Date(s) Administered   Fluad Quad(high Dose 65+) 12/14/2018   Influenza Split 12/13/2011, 12/13/2014   Influenza, High Dose Seasonal PF 12/08/2017   Influenza-Unspecified 12/13/2015, 12/29/2016   PFIZER Comirnaty(Gray Top)Covid-19 Tri-Sucrose Vaccine 06/13/2020   PFIZER(Purple Top)SARS-COV-2 Vaccination 04/03/2019, 04/24/2019, 11/26/2019   Pneumococcal Conjugate-13 10/31/2017   Pneumococcal Polysaccharide-23 05/13/2020   Tdap 12/06/2010, 07/23/2020   Zoster Recombinat (Shingrix) 04/05/2017, 06/08/2017   Zoster,  Live 10/26/2012    TDAP status: Up to date  Flu Vaccine status: Up to date  Pneumococcal vaccine status: Up to date  Covid-19 vaccine status: Completed vaccines  Qualifies  for Shingles Vaccine? Yes   Zostavax completed Yes   Shingrix Completed?: Yes  Screening Tests Health Maintenance  Topic Date Due   INFLUENZA VACCINE  10/12/2020   COLONOSCOPY (Pts 45-46yr Insurance coverage will need to be confirmed)  02/13/2021   MAMMOGRAM  07/01/2021   DEXA SCAN  05/24/2023   TETANUS/TDAP  07/24/2030   COVID-19 Vaccine  Completed   Hepatitis C Screening  Completed   PNA vac Low Risk Adult  Completed   Zoster Vaccines- Shingrix  Completed   HPV VACCINES  Aged Out    Health Maintenance  There are no preventive care reminders to display for this patient.   Colorectal cancer screening: Type of screening: Colonoscopy. Completed 02/14/2011. Repeat every 10 years  Mammogram status: Completed 07/02/2019. Repeat every year  Bone Density status: Completed 05/24/2018. Results reflect: Bone density results: NORMAL. Repeat every 5 years.  Lung Cancer Screening: (Low Dose CT Chest recommended if Age 68-80years, 30 pack-year currently smoking OR have quit w/in 15years.) does not qualify.   Lung Cancer Screening Referral: no  Additional Screening:  Hepatitis C Screening: does qualify; Completed yes  Vision Screening: Recommended annual ophthalmology exams for early detection of glaucoma and other disorders of the eye. Is the patient up to date with their annual eye exam?  Yes  Who is the provider or what is the name of the office in which the patient attends annual eye exams? GKaty Apo MD. If pt is not established with a provider, would they like to be referred to a provider to establish care? No .   Dental Screening: Recommended annual dental exams for proper oral hygiene  Community Resource Referral / Chronic Care Management: CRR required this visit?  No   CCM required this visit?   No      Plan:     I have personally reviewed and noted the following in the patient's chart:   Medical and social history Use of alcohol, tobacco or illicit drugs  Current medications and supplements including opioid prescriptions.  Functional ability and status Nutritional status Physical activity Advanced directives List of other physicians Hospitalizations, surgeries, and ER visits in previous 12 months Vitals Screenings to include cognitive, depression, and falls Referrals and appointments  In addition, I have reviewed and discussed with patient certain preventive protocols, quality metrics, and best practice recommendations. A written personalized care plan for preventive services as well as general preventive health recommendations were provided to patient.     SSheral Flow LPN   [id number]  Nurse Notes: n/a

## 2020-10-02 ENCOUNTER — Encounter: Payer: Self-pay | Admitting: Internal Medicine

## 2020-10-02 MED ORDER — NIRMATRELVIR/RITONAVIR (PAXLOVID)TABLET: 3.0000 | ORAL_TABLET | Freq: Two times a day (BID) | ORAL | 0 refills | Status: AC

## 2020-10-02 NOTE — Telephone Encounter (Signed)
Follow up message   Patient calling to report she is taking Mucinex OTC Requesting additional advice for covid+ symptoms going into the weekend

## 2020-11-02 ENCOUNTER — Other Ambulatory Visit: Payer: Self-pay | Admitting: Internal Medicine

## 2020-11-17 NOTE — Progress Notes (Signed)
Subjective:    Patient ID: Victoria Stone, female    DOB: [birth year], 70 y.o.   MRN: [id number]  HPI The patient is here for follow up of their chronic medical problems, including hichol, prediabetes, GERD, anxiety, depression, knee OA, sleep difficulties   She is taking all of her medications as prescribed.  She is doing well and has no concerns.    Medications and allergies reviewed with patient and updated if appropriate.  Patient Active Problem List   Diagnosis Date Noted   Vitamin D deficiency 11/18/2020   Sleep difficulties 11/06/2019   Trigger finger of right thumb 10/09/2019   Prediabetes 04/06/2017   Bilateral carpal tunnel syndrome 04/04/2017   Family history of premature CAD 07/20/2016   GERD (gastroesophageal reflux disease) 02/08/2016   Chest pain 07/15/2015   Dyslipidemia    Adjustment disorder with mixed anxiety and depressed mood 11/20/2008   ALLERGIC RHINITIS 11/20/2008   Ulcerative colitis ([place]) 11/20/2008   Arthritis of knee 11/20/2008    Current Outpatient Medications on File Prior to Visit  Medication Sig Dispense Refill   atorvastatin (LIPITOR) 20 MG tablet Take 1 tablet (20 mg total) by mouth daily. 90 tablet 1   Calcium Carbonate (CALCIUM 600 PO) Take by mouth daily.     FLUoxetine (PROZAC) 20 MG capsule TAKE 1 CAPSULE BY MOUTH EVERY DAY 90 capsule 1   meloxicam (MOBIC) 7.5 MG tablet TAKE 1 TABLET (7.5 MG TOTAL) BY MOUTH EVERY OTHER DAY. 45 tablet 1   Multiple Vitamins-Minerals (MULTIVITAMIN) tablet Take 1 tablet by mouth daily.     omeprazole (PRILOSEC) 20 MG capsule TAKE 1 CAPSULE BY MOUTH EVERY DAY 90 capsule 1   triamcinolone ointment (KENALOG) 0.5 % Apply 1 application topically 2 (two) times daily as needed (foot rash). 30 g 1   VITAMIN D, ERGOCALCIFEROL, PO Take 1,000 Int'l Units/kg by mouth.     No current facility-administered medications on file prior to visit.    Past Medical History:  Diagnosis Date   ALLERGIC RHINITIS    ARTHRITIS     Arthritis of knee 11/20/2008   Carpal tunnel syndrome on right 04/04/2017   Chest pain 07/15/2015   Dyslipidemia    Family history of premature coronary artery disease 07/15/2015   GERD (gastroesophageal reflux disease) 02/08/2016   Obese    Prediabetes 04/06/2017   ULCERATIVE COLITIS    Ulcerative colitis ([employer]) 11/20/2008    Past Surgical History:  Procedure Laterality Date   ABDOMINAL HYSTERECTOMY  2001   BONE SPUR     BREAST SURGERY  1988   Reduction   CARPAL TUNNEL RELEASE Right 12/31/2018   Procedure: RIGHT CARPAL TUNNEL RELEASE;  Surgeon: Leanora Cover, MD;  Location: [facility];  Service: Orthopedics;  Laterality: Right;   FOOT SURGERY  90, 04, 09   X'S 3   HAMMER TOE SURGERY     LAPAROSCOPIC GASTRIC BANDING  2005   TUBAL LIGATION  1988    Social History   Socioeconomic History   Marital status: Married    Spouse name: Not on file   Number of children: 2   Years of education: Not on file   Highest education level: Not on file  Occupational History   Occupation: RN    Employer: [employer]  Tobacco Use   Smoking status: Never   Smokeless tobacco: Never   Tobacco comments:    Married, 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[facility]  Vaping Use   Vaping Use:  Never used  Substance and Sexual Activity   Alcohol use: No    Comment: occasionally   Drug use: No   Sexual activity: Not on file  Other Topics Concern   Not on file  Social History Narrative   RN at [employer]   Married, lives with spouse   Social Determinants of Health   Financial Resource Strain: Low Risk    Difficulty of Paying Living Expenses: Not hard at all  Food Insecurity: No Food Insecurity   Worried About Charity fundraiser in the Last Year: Never true   Arboriculturist in the Last Year: Never true  Transportation Needs: No Transportation Needs   Lack of Transportation (Medical): No   Lack of Transportation (Non-Medical): No  Physical Activity: Sufficiently Active   Days of  Exercise per Week: 5 days   Minutes of Exercise per Session: 30 min  Stress: No Stress Concern Present   Feeling of Stress : Not at all  Social Connections: Socially Integrated   Frequency of Communication with Friends and Family: More than three times a week   Frequency of Social Gatherings with Friends and Family: More than three times a week   Attends Religious Services: More than 4 times per year   Active Member of [employer] or Organizations: Yes   Attends Music therapist: More than 4 times per year   Marital Status: Married    Family History  Problem Relation Age of Onset   Hypertension Mother    Lymphoma Mother    Other Mother        PACEMAKER   Heart disease Mother    Heart attack Mother    Hypertension Father    Heart disease Father    Heart attack Father    Hypertension Sister    Diabetes Sister    Heart attack Sister    Heart disease Sister    Heart disease Brother    Breast cancer Brother    Breast cancer Sister    Hypertension Maternal Grandmother    Hypertension Paternal Grandmother    Hypertension Paternal Grandfather     Review of Systems  Constitutional:  Negative for chills and fever.  Respiratory:  Negative for cough, shortness of breath and wheezing.   Cardiovascular:  Negative for chest pain, palpitations and leg swelling.  Neurological:  Negative for light-headedness and headaches.      Objective:   Vitals:   11/18/20 1049  BP: 120/76  Pulse: 64  Temp: 98 F (36.7 C)  SpO2: 96%   BP Readings from Last 3 Encounters:  11/18/20 120/76  09/24/20 120/78  05/13/20 126/80   Wt Readings from Last 3 Encounters:  11/18/20 155 lb (70.3 kg)  09/24/20 150 lb 3.2 oz (68.1 kg)  05/13/20 148 lb (67.1 kg)   Body mass index is 25.79 kg/m.   Physical Exam    Constitutional: Appears well-developed and well-nourished. No distress.  HENT:  Head: Normocephalic and atraumatic.  Neck: Neck supple. No tracheal deviation present. No  thyromegaly present.  No cervical lymphadenopathy Cardiovascular: Normal rate, regular rhythm and normal heart sounds.   No murmur heard. No carotid bruit .  No edema Pulmonary/Chest: Effort normal and breath sounds normal. No respiratory distress. No has no wheezes. No rales.  Skin: Skin is warm and dry. Not diaphoretic.  Psychiatric: Normal mood and affect. Behavior is normal.      Assessment & Plan:    See Problem List for Assessment and Plan  of chronic medical problems.    This visit occurred during the SARS-CoV-2 public health emergency.  Safety protocols were in place, including screening questions prior to the visit, additional usage of staff PPE, and extensive cleaning of exam room while observing appropriate contact time as indicated for disinfecting solutions.

## 2020-11-17 NOTE — Patient Instructions (Addendum)
  Blood work was ordered.     Medications changes include :   none    Please followup in 6 months

## 2020-11-18 ENCOUNTER — Encounter: Payer: Self-pay | Admitting: Internal Medicine

## 2020-11-18 ENCOUNTER — Other Ambulatory Visit: Payer: Self-pay

## 2020-11-18 ENCOUNTER — Ambulatory Visit (INDEPENDENT_AMBULATORY_CARE_PROVIDER_SITE_OTHER): Payer: Medicare Other | Admitting: Internal Medicine

## 2020-11-18 VITALS — BP 120/76 | HR 64 | Temp 98.0°F | Ht 65.0 in | Wt 155.0 lb

## 2020-11-18 DIAGNOSIS — F4323 Adjustment disorder with mixed anxiety and depressed mood: Secondary | ICD-10-CM

## 2020-11-18 DIAGNOSIS — M8588 Other specified disorders of bone density and structure, other site: Secondary | ICD-10-CM | POA: Diagnosis not present

## 2020-11-18 DIAGNOSIS — R7303 Prediabetes: Secondary | ICD-10-CM | POA: Diagnosis not present

## 2020-11-18 DIAGNOSIS — E559 Vitamin D deficiency, unspecified: Secondary | ICD-10-CM | POA: Diagnosis not present

## 2020-11-18 DIAGNOSIS — G479 Sleep disorder, unspecified: Secondary | ICD-10-CM

## 2020-11-18 DIAGNOSIS — M1710 Unilateral primary osteoarthritis, unspecified knee: Secondary | ICD-10-CM

## 2020-11-18 DIAGNOSIS — Z6826 Body mass index (BMI) 26.0-26.9, adult: Secondary | ICD-10-CM | POA: Diagnosis not present

## 2020-11-18 DIAGNOSIS — K219 Gastro-esophageal reflux disease without esophagitis: Secondary | ICD-10-CM | POA: Diagnosis not present

## 2020-11-18 DIAGNOSIS — Z124 Encounter for screening for malignant neoplasm of cervix: Secondary | ICD-10-CM | POA: Diagnosis not present

## 2020-11-18 DIAGNOSIS — E785 Hyperlipidemia, unspecified: Secondary | ICD-10-CM | POA: Diagnosis not present

## 2020-11-18 DIAGNOSIS — N958 Other specified menopausal and perimenopausal disorders: Secondary | ICD-10-CM | POA: Diagnosis not present

## 2020-11-18 DIAGNOSIS — Z7689 Persons encountering health services in other specified circumstances: Secondary | ICD-10-CM | POA: Diagnosis not present

## 2020-11-18 DIAGNOSIS — Z01419 Encounter for gynecological examination (general) (routine) without abnormal findings: Secondary | ICD-10-CM | POA: Diagnosis not present

## 2020-11-18 DIAGNOSIS — Z1231 Encounter for screening mammogram for malignant neoplasm of breast: Secondary | ICD-10-CM | POA: Diagnosis not present

## 2020-11-18 LAB — CBC WITH DIFFERENTIAL/PLATELET
Basophils Absolute: 0.1 10*3/uL (ref 0.0–0.1)
Basophils Relative: 1 % (ref 0.0–3.0)
Eosinophils Absolute: 0.2 10*3/uL (ref 0.0–0.7)
Eosinophils Relative: 2.8 % (ref 0.0–5.0)
HCT: 41.2 % (ref 36.0–46.0)
Hemoglobin: 13.8 g/dL (ref 12.0–15.0)
Lymphocytes Relative: 22.7 % (ref 12.0–46.0)
Lymphs Abs: 1.4 10*3/uL (ref 0.7–4.0)
MCHC: 33.5 g/dL (ref 30.0–36.0)
MCV: 86.8 fl (ref 78.0–100.0)
Monocytes Absolute: 0.4 10*3/uL (ref 0.1–1.0)
Monocytes Relative: 6.1 % (ref 3.0–12.0)
Neutro Abs: 4.2 10*3/uL (ref 1.4–7.7)
Neutrophils Relative %: 67.4 % (ref 43.0–77.0)
Platelets: 291 10*3/uL (ref 150.0–400.0)
RBC: 4.74 Mil/uL (ref 3.87–5.11)
RDW: 15.1 % (ref 11.5–15.5)
WBC: 6.2 10*3/uL (ref 4.0–10.5)

## 2020-11-18 LAB — COMPREHENSIVE METABOLIC PANEL
ALT: 33 U/L (ref 0–35)
AST: 25 U/L (ref 0–37)
Albumin: 4.1 g/dL (ref 3.5–5.2)
Alkaline Phosphatase: 77 U/L (ref 39–117)
BUN: 19 mg/dL (ref 6–23)
CO2: 30 mEq/L (ref 19–32)
Calcium: 9.7 mg/dL (ref 8.4–10.5)
Chloride: 101 mEq/L (ref 96–112)
Creatinine, Ser: 0.78 mg/dL (ref 0.40–1.20)
GFR: 78.21 mL/min (ref >60.00)
Glucose, Bld: 93 mg/dL (ref 70–99)
Potassium: 4.1 mEq/L (ref 3.5–5.1)
Sodium: 139 mEq/L (ref 135–145)
Total Bilirubin: 0.7 mg/dL (ref 0.2–1.2)
Total Protein: 6.9 g/dL (ref 6.0–8.3)

## 2020-11-18 LAB — LIPID PANEL
Cholesterol: 181 mg/dL (ref 0–200)
HDL: 75.9 mg/dL (ref >39.00)
LDL Cholesterol: 84 mg/dL (ref 0–99)
NonHDL: 105.05
Total CHOL/HDL Ratio: 2
Triglycerides: 106 mg/dL (ref 0.0–149.0)
VLDL: 21.2 mg/dL (ref 0.0–40.0)

## 2020-11-18 LAB — HEMOGLOBIN A1C: Hgb A1c MFr Bld: 5.8 % (ref 4.6–6.5)

## 2020-11-18 LAB — VITAMIN D 25 HYDROXY (VIT D DEFICIENCY, FRACTURES): VITD: 48.14 ng/mL (ref 30.00–100.00)

## 2020-11-18 NOTE — Assessment & Plan Note (Signed)
Chronic Controlled, stable Continue meloxicam 7.5 mg

## 2020-11-18 NOTE — Assessment & Plan Note (Signed)
Chronic Taking otc vitamin D daily Check vitamin D level

## 2020-11-18 NOTE — Assessment & Plan Note (Signed)
Chronic Check a1c Low sugar / carb diet Stressed regular exercise  

## 2020-11-18 NOTE — Assessment & Plan Note (Signed)
Chronic GERD controlled as long as she does not eat after 7pm Continue omeprazole 20 mg daily - still requires this daily

## 2020-11-18 NOTE — Assessment & Plan Note (Signed)
Chronic Controlled, stable Continue fluoxetine 20 mg daily

## 2020-11-18 NOTE — Assessment & Plan Note (Signed)
Chronic Check lipid panel  Continue atorvastatin 20 mg daily Regular exercise and healthy diet encouraged

## 2020-12-16 DIAGNOSIS — Z23 Encounter for immunization: Secondary | ICD-10-CM | POA: Diagnosis not present

## 2020-12-25 ENCOUNTER — Telehealth: Payer: Self-pay

## 2020-12-25 NOTE — Progress Notes (Signed)
    Chronic Care Management Pharmacy Assistant   Name: Victoria Stone  MRN: [id number] DOB: 1950/07/09   Reason for Encounter: Disease State   Conditions to be addressed/monitored: General   Recent office visits:  11/18/20 Binnie Rail, MD-PCP (Gastroesophageal reflux disease) no med changes  Recent consult visits:  None [place] [facility] visits:  None in previous 6 months  Medications: Outpatient Encounter Medications as of 12/25/2020  Medication Sig   atorvastatin (LIPITOR) 20 MG tablet Take 1 tablet (20 mg total) by mouth daily.   Calcium Carbonate (CALCIUM 600 PO) Take by mouth daily.   FLUoxetine (PROZAC) 20 MG capsule TAKE 1 CAPSULE BY MOUTH EVERY DAY   meloxicam (MOBIC) 7.5 MG tablet TAKE 1 TABLET (7.5 MG TOTAL) BY MOUTH EVERY OTHER DAY.   Multiple Vitamins-Minerals (MULTIVITAMIN) tablet Take 1 tablet by mouth daily.   omeprazole (PRILOSEC) 20 MG capsule TAKE 1 CAPSULE BY MOUTH EVERY DAY   triamcinolone ointment (KENALOG) 0.5 % Apply 1 application topically 2 (two) times daily as needed (foot rash).   VITAMIN D, ERGOCALCIFEROL, PO Take 1,000 Int'l Units/kg by mouth.   No facility-administered encounter medications on file as of 12/25/2020.   Have you had any problems recently with your health?Patient states that she is not having any new health issues at this time  Have you had any problems with your pharmacy?Patient states that she is not having any problems with getting her medication or the cost of medications from the pharmacy  What issues or side effects are you having with your medications?Patient states that she does not have any side effects from medications  What would you like me to pass along to [facility] for them to help you with? Patient states that she does not have any concerns about her health or medications  What can we do to take care of you better? Patient states not at this time  Care Gaps: Colonoscopy-NA Diabetic Foot  Exam-NA Mammogram-NA Ophthalmology-NA Dexa Scan - NA Annual Well Visit - NA Micro albumin-NA Hemoglobin A1c- 11/18/20  Star Rating Drugs: Atorvastatin 20 mg last fill  Ethelene Hal Clinical Pharmacist Assistant [phone]

## 2021-01-11 ENCOUNTER — Encounter: Payer: Self-pay | Admitting: Internal Medicine

## 2021-01-27 ENCOUNTER — Ambulatory Visit: Payer: Medicare Other | Admitting: Cardiology

## 2021-02-01 ENCOUNTER — Other Ambulatory Visit: Payer: Self-pay | Admitting: Internal Medicine

## 2021-02-07 ENCOUNTER — Other Ambulatory Visit: Payer: Self-pay | Admitting: Internal Medicine

## 2021-02-17 ENCOUNTER — Telehealth: Payer: Self-pay

## 2021-02-17 NOTE — Chronic Care Management (AMB) (Signed)
    Chronic Care Management Pharmacy Assistant   Name: Victoria Stone  MRN: [id number] DOB: Oct 05, 1950   Reason for Encounter: Disease State-General Assessment    Recent office visits:  None ID  Recent consult visits:  None ID  Hospital visits:  None in previous 6 months  Medications: Outpatient Encounter Medications as of 02/17/2021  Medication Sig   atorvastatin (LIPITOR) 20 MG tablet TAKE 1 TABLET BY MOUTH EVERY DAY   Calcium Carbonate (CALCIUM 600 PO) Take by mouth daily.   FLUoxetine (PROZAC) 20 MG capsule TAKE 1 CAPSULE BY MOUTH EVERY DAY   meloxicam (MOBIC) 7.5 MG tablet TAKE 1 TABLET (7.5 MG TOTAL) BY MOUTH EVERY OTHER DAY.   Multiple Vitamins-Minerals (MULTIVITAMIN) tablet Take 1 tablet by mouth daily.   omeprazole (PRILOSEC) 20 MG capsule TAKE 1 CAPSULE BY MOUTH EVERY DAY   triamcinolone ointment (KENALOG) 0.5 % Apply 1 application topically 2 (two) times daily as needed (foot rash).   VITAMIN D, ERGOCALCIFEROL, PO Take 1,000 Int'l Units/kg by mouth.   No facility-administered encounter medications on file as of 02/17/2021.   Have you had any problems recently with your health?Patient states that she is doing very well and has not had any new health issues  Have you had any problems with your pharmacy?Patient states that she has not had a problem with trying to get medications or even the cost of her medications  What issues or side effects are you having with your medications?Patient states she has not had any side effects from medications  What would you like me to pass along to [facility] for them to help you with? Patient just said that she is fine and does not have any complaints  What can we do to take care of you better? Patient states that she really likes these follow up calls to check on her, but does not need anything at this time  Care Gaps:  Colonoscopy-NA Diabetic Foot Exam-NA Mammogram-NA Ophthalmology-NA Dexa Scan - NA Annual Well Visit -  NA Micro albumin-NA Hemoglobin A1c- 11/18/20  Star Rating Drugs: Atorvastatin 20 mg last fill-last fill 02/08/21 90 ds  Ethelene Hal Clinical Pharmacist Assistant [phone]

## 2021-03-18 DIAGNOSIS — H524 Presbyopia: Secondary | ICD-10-CM | POA: Diagnosis not present

## 2021-03-18 DIAGNOSIS — H52203 Unspecified astigmatism, bilateral: Secondary | ICD-10-CM | POA: Diagnosis not present

## 2021-03-18 DIAGNOSIS — H2513 Age-related nuclear cataract, bilateral: Secondary | ICD-10-CM | POA: Diagnosis not present

## 2021-03-19 DIAGNOSIS — M79671 Pain in right foot: Secondary | ICD-10-CM | POA: Diagnosis not present

## 2021-03-19 DIAGNOSIS — M25571 Pain in right ankle and joints of right foot: Secondary | ICD-10-CM | POA: Diagnosis not present

## 2021-04-07 DIAGNOSIS — D040 Carcinoma in situ of skin of lip: Secondary | ICD-10-CM | POA: Diagnosis not present

## 2021-04-07 DIAGNOSIS — D485 Neoplasm of uncertain behavior of skin: Secondary | ICD-10-CM | POA: Diagnosis not present

## 2021-04-08 ENCOUNTER — Ambulatory Visit: Payer: Medicare Other | Admitting: Cardiology

## 2021-04-19 DIAGNOSIS — M79671 Pain in right foot: Secondary | ICD-10-CM | POA: Diagnosis not present

## 2021-04-19 DIAGNOSIS — M25571 Pain in right ankle and joints of right foot: Secondary | ICD-10-CM | POA: Diagnosis not present

## 2021-04-22 DIAGNOSIS — M8580 Other specified disorders of bone density and structure, unspecified site: Secondary | ICD-10-CM | POA: Diagnosis not present

## 2021-05-11 DIAGNOSIS — C4402 Squamous cell carcinoma of skin of lip: Secondary | ICD-10-CM | POA: Diagnosis not present

## 2021-05-11 DIAGNOSIS — Z85828 Personal history of other malignant neoplasm of skin: Secondary | ICD-10-CM | POA: Diagnosis not present

## 2021-05-18 ENCOUNTER — Other Ambulatory Visit: Payer: Self-pay | Admitting: Internal Medicine

## 2021-06-28 ENCOUNTER — Encounter: Payer: Medicare Other | Admitting: Gastroenterology

## 2021-07-05 ENCOUNTER — Encounter: Payer: Medicare Other | Admitting: Internal Medicine

## 2021-07-05 ENCOUNTER — Ambulatory Visit: Payer: Medicare Other | Admitting: Cardiology

## 2021-07-09 ENCOUNTER — Ambulatory Visit (AMBULATORY_SURGERY_CENTER): Payer: Medicare Other | Admitting: *Deleted

## 2021-07-09 VITALS — Ht 65.0 in | Wt 152.0 lb

## 2021-07-09 DIAGNOSIS — Z1211 Encounter for screening for malignant neoplasm of colon: Secondary | ICD-10-CM

## 2021-07-09 MED ORDER — NA SULFATE-K SULFATE-MG SULF 17.5-3.13-1.6 GM/177ML PO SOLN: 1.0000 | ORAL | 0 refills | Status: DC

## 2021-07-09 NOTE — Progress Notes (Signed)
Patient's pre-visit was done today over the phone with the patient. Name,DOB and address verified. Patient denies any allergies to Eggs and Soy. Patient denies any problems with anesthesia/sedation. Patient is not taking any diet pills or blood thinners. No home Oxygen. Insurance confirmed with patient. ? ?Prep instructions sent to pt's MyChart -pt is aware. Patient understands to call us back with any questions or concerns. Patient is aware of our care-partner policy. Patient instructioned to hold Meloxicam 7 days prior to procedure. Pt will use Singlecare/Good rx for suprep rx. ? ?EMMI education assigned to the patient for the procedure, sent to [place].  ? ?The patient is COVID-19 vaccinated.   ?

## 2021-07-21 ENCOUNTER — Encounter: Payer: Self-pay | Admitting: Internal Medicine

## 2021-07-21 ENCOUNTER — Ambulatory Visit (AMBULATORY_SURGERY_CENTER): Payer: Medicare Other | Admitting: Internal Medicine

## 2021-07-21 VITALS — BP 127/52 | HR 63 | Temp 98.9°F | Resp 16 | Ht 65.0 in | Wt 152.0 lb

## 2021-07-21 DIAGNOSIS — Z1211 Encounter for screening for malignant neoplasm of colon: Secondary | ICD-10-CM

## 2021-07-21 DIAGNOSIS — E669 Obesity, unspecified: Secondary | ICD-10-CM | POA: Diagnosis not present

## 2021-07-21 DIAGNOSIS — D123 Benign neoplasm of transverse colon: Secondary | ICD-10-CM

## 2021-07-21 DIAGNOSIS — K635 Polyp of colon: Secondary | ICD-10-CM

## 2021-07-21 DIAGNOSIS — R7303 Prediabetes: Secondary | ICD-10-CM | POA: Diagnosis not present

## 2021-07-21 MED ORDER — SODIUM CHLORIDE 0.9 % IV SOLN: 500.0000 mL | INTRAVENOUS | Status: DC

## 2021-07-21 NOTE — Patient Instructions (Signed)
2 polyps removed, await pathology ? ?Please read over handouts about polyps, diverticulosis, and hemorrhoids ? ?Continue your normal medications ? ? ? ?YOU HAD AN ENDOSCOPIC PROCEDURE TODAY AT [facility]:   Refer to the procedure report that was given to you for any specific questions about what was found during the examination.  If the procedure report does not answer your questions, please call your gastroenterologist to clarify.  If you requested that your care partner not be given the details of your procedure findings, then the procedure report has been included in a sealed envelope for you to review at your convenience later. ? ?YOU SHOULD EXPECT: Some feelings of bloating in the abdomen. Passage of more gas than usual.  Walking can help get rid of the air that was put into your GI tract during the procedure and reduce the bloating. If you had a lower endoscopy (such as a colonoscopy or flexible sigmoidoscopy) you may notice spotting of blood in your stool or on the toilet paper. If you underwent a bowel prep for your procedure, you may not have a normal bowel movement for a few days. ? ?Please Note:  You might notice some irritation and congestion in your nose or some drainage.  This is from the oxygen used during your procedure.  There is no need for concern and it should clear up in a day or so. ? ?SYMPTOMS TO REPORT IMMEDIATELY: ? ?Following lower endoscopy (colonoscopy or flexible sigmoidoscopy): ? Excessive amounts of blood in the stool ? Significant tenderness or worsening of abdominal pains ? Swelling of the abdomen that is new, acute ? Fever of 100?F or higher ? ? ?For urgent or emergent issues, a gastroenterologist can be reached at any hour by calling [phone]. ?Do not use MyChart messaging for urgent concerns.  ? ? ?DIET:  We do recommend a small meal at first, but then you may proceed to your regular diet.  Drink plenty of fluids but you should avoid alcoholic beverages for  24 hours. ? ?ACTIVITY:  You should plan to take it easy for the rest of today and you should NOT DRIVE or use heavy machinery until tomorrow (because of the sedation medicines used during the test).   ? ?FOLLOW UP: ?Our staff will call the number listed on your records 48-72 hours following your procedure to check on you and address any questions or concerns that you may have regarding the information given to you following your procedure. If we do not reach you, we will leave a message.  We will attempt to reach you two times.  During this call, we will ask if you have developed any symptoms of COVID 19. If you develop any symptoms (ie: fever, flu-like symptoms, shortness of breath, cough etc.) before then, please call [phone].  If you test positive for Covid 19 in the 2 weeks post procedure, please call and report this information to [place].   ? ?If any biopsies were taken you will be contacted by phone or by letter within the next 1-3 weeks.  Please call us at [phone] if you have not heard about the biopsies in 3 weeks.  ? ? ?SIGNATURES/CONFIDENTIALITY: ?You and/or your care partner have signed paperwork which will be entered into your electronic medical record.  These signatures attest to the fact that that the information above on your After Visit Summary has been reviewed and is understood.  Full responsibility of the confidentiality of this discharge information lies with  you and/or your care-partner. ? ?

## 2021-07-21 NOTE — Progress Notes (Signed)
Pt's states no medical or surgical changes since previsit or office visit. 

## 2021-07-21 NOTE — Progress Notes (Signed)
Report to pacu rn; vss. ?

## 2021-07-21 NOTE — Progress Notes (Signed)
? ?GASTROENTEROLOGY PROCEDURE H&P NOTE  ? ?Primary Care Physician: ?Binnie Rail, MD ? ? ? ?Reason for Procedure:  Colon cancer screening ? ?Plan:    Colonoscopy ? ?Patient is appropriate for endoscopic procedure(s) in the ambulatory ([facility]) setting. ? ?The nature of the procedure, as well as the risks, benefits, and alternatives were carefully and thoroughly reviewed with the patient. Ample time for discussion and questions allowed. The patient understood, was satisfied, and agreed to proceed.  ? ? ? ?HPI: ?Victoria Stone is a 71 y.o. female who presents for colonoscopy.  Medical history as below.  Tolerated the prep.  No recent chest pain or shortness of breath.  No abdominal pain today. ? ?Past Medical History:  ?Diagnosis Date  ? ALLERGIC RHINITIS   ? ARTHRITIS   ? Arthritis of knee 11/20/2008  ? Carpal tunnel syndrome on right 04/04/2017  ? Chest pain 07/15/2015  ? Dyslipidemia   ? Family history of premature coronary artery disease 07/15/2015  ? GERD (gastroesophageal reflux disease) 02/08/2016  ? Obese   ? Prediabetes 04/06/2017  ? ULCERATIVE COLITIS   ? Ulcerative colitis ([place]) 11/20/2008  ? ? ?Past Surgical History:  ?Procedure Laterality Date  ? ABDOMINAL HYSTERECTOMY  03/15/1999  ? BONE SPUR    ? BREAST SURGERY  03/14/1986  ? Reduction  ? CARPAL TUNNEL RELEASE Right 12/31/2018  ? Procedure: RIGHT CARPAL TUNNEL RELEASE;  Surgeon: Leanora Cover, MD;  Location: [facility];  Service: Orthopedics;  Laterality: Right;  ? COLONOSCOPY  02/14/2011  ? Dr.Patterson  ? FOOT SURGERY  90, 04, 09  ? X'S 3  ? HAMMER TOE SURGERY    ? LAPAROSCOPIC GASTRIC BANDING  03/15/2003  ? TUBAL LIGATION  03/14/1986  ? ? ?Prior to Admission medications   ?Medication Sig Start Date End Date Taking? Authorizing Provider  ?atorvastatin (LIPITOR) 20 MG tablet TAKE 1 TABLET BY MOUTH EVERY DAY 02/08/21  Yes Burns, Claudina Lick, MD  ?Calcium Carbonate (CALCIUM 600 PO) Take by mouth daily.   Yes [provider]  ?FLUoxetine (PROZAC)  20 MG capsule TAKE 1 CAPSULE BY MOUTH EVERY DAY 05/18/21  Yes Burns, Claudina Lick, MD  ?meloxicam (MOBIC) 7.5 MG tablet TAKE 1 TABLET (7.5 MG TOTAL) BY MOUTH EVERY OTHER DAY. 02/01/21  Yes Burns, Claudina Lick, MD  ?Multiple Vitamins-Minerals (MULTIVITAMIN) tablet Take 1 tablet by mouth daily. 02/08/16  Yes Burns, Claudina Lick, MD  ?omeprazole (PRILOSEC) 20 MG capsule TAKE 1 CAPSULE BY MOUTH EVERY DAY 02/01/21  Yes Burns, Claudina Lick, MD  ?VITAMIN D, ERGOCALCIFEROL, PO Take 1,000 Int'l Units/kg by mouth.   Yes [provider]  ?triamcinolone ointment (KENALOG) 0.5 % Apply 1 application topically 2 (two) times daily as needed (foot rash). 04/04/17   Binnie Rail, MD  ? ? ?Current Outpatient Medications  ?Medication Sig Dispense Refill  ? atorvastatin (LIPITOR) 20 MG tablet TAKE 1 TABLET BY MOUTH EVERY DAY 90 tablet 1  ? Calcium Carbonate (CALCIUM 600 PO) Take by mouth daily.    ? FLUoxetine (PROZAC) 20 MG capsule TAKE 1 CAPSULE BY MOUTH EVERY DAY 90 capsule 1  ? meloxicam (MOBIC) 7.5 MG tablet TAKE 1 TABLET (7.5 MG TOTAL) BY MOUTH EVERY OTHER DAY. 45 tablet 1  ? Multiple Vitamins-Minerals (MULTIVITAMIN) tablet Take 1 tablet by mouth daily.    ? omeprazole (PRILOSEC) 20 MG capsule TAKE 1 CAPSULE BY MOUTH EVERY DAY 90 capsule 1  ? VITAMIN D, ERGOCALCIFEROL, PO Take 1,000 Int'l Units/kg by mouth.    ?  triamcinolone ointment (KENALOG) 0.5 % Apply 1 application topically 2 (two) times daily as needed (foot rash). 30 g 1  ? ?Current Facility-Administered Medications  ?Medication Dose Route Frequency Provider Last Rate Last Admin  ? 0.9 %  sodium chloride infusion  500 mL Intravenous Continuous Elma Shands, Lajuan Lines, MD      ? ? ?Allergies as of 07/21/2021 - Review Complete 07/21/2021  ?Allergen Reaction Noted  ? Penicillins Hives and Rash   ? Trazodone and nefazodone Other (See Comments) 11/07/2019  ? ? ?Family History  ?Problem Relation Age of Onset  ? Hypertension Mother   ? Lymphoma Mother   ? Other Mother   ?     PACEMAKER  ? Heart  disease Mother   ? Heart attack Mother   ? Hypertension Father   ? Heart disease Father   ? Heart attack Father   ? Hypertension Sister   ? Diabetes Sister   ? Heart attack Sister   ? Heart disease Sister   ? Breast cancer Sister   ? Heart disease Brother   ? Breast cancer Brother   ? Hypertension Maternal Grandmother   ? Hypertension Paternal Grandmother   ? Hypertension Paternal Grandfather   ? Colon cancer Neg Hx   ? ? ?Social History  ? ?Socioeconomic History  ? Marital status: Married  ?  Spouse name: Not on file  ? Number of children: 2  ? Years of education: Not on file  ? Highest education level: Not on file  ?Occupational History  ? Occupation: Therapist, sports  ?  Employer: [employer]  ?Tobacco Use  ? Smoking status: Never  ? Smokeless tobacco: Never  ? Tobacco comments:  ?  Married, 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[employer]  ?Vaping Use  ? Vaping Use: Never used  ?Substance and Sexual Activity  ? Alcohol use: Yes  ?  Comment: occasionally  ? Drug use: No  ? Sexual activity: Not on file  ?Other Topics Concern  ? Not on file  ?Social History Narrative  ? RN at [employer]  ? Married, lives with spouse  ? ?Social Determinants of Health  ? ?Financial Resource Strain: Low Risk   ? Difficulty of Paying Living Expenses: Not hard at all  ?Food Insecurity: No Food Insecurity  ? Worried About Charity fundraiser in the Last Year: Never true  ? Ran Out of Food in the Last Year: Never true  ?Transportation Needs: No Transportation Needs  ? Lack of Transportation (Medical): No  ? Lack of Transportation (Non-Medical): No  ?Physical Activity: Sufficiently Active  ? Days of Exercise per Week: 5 days  ? Minutes of Exercise per Session: 30 min  ?Stress: No Stress Concern Present  ? Feeling of Stress : Not at all  ?Social Connections: Socially Integrated  ? Frequency of Communication with Friends and Family: More than three times a week  ? Frequency of Social Gatherings with Friends and Family: More than three times a week  ? Attends  Religious Services: More than 4 times per year  ? Active Member of Clubs or Organizations: Yes  ? Attends Archivist Meetings: More than 4 times per year  ? Marital Status: Married  ?Intimate Partner Violence: Not At Risk  ? Fear of Current or Ex-Partner: No  ? Emotionally Abused: No  ? Physically Abused: No  ? Sexually Abused: No  ? ? ?Physical Exam: ?Vital signs in last 24 hours: ?@BP  (!) 153/94   Pulse 73  Temp 98.9 ?F (37.2 ?C)   Ht 5' 5"  (1.651 m)   Wt 152 lb (68.9 kg)   SpO2 100%   BMI 25.29 kg/m?  ?GEN: NAD ?EYE: Sclerae anicteric ?ENT: MMM ?CV: Non-tachycardic ?Pulm: CTA b/l ?GI: Soft, NT/ND ?NEURO:  Alert & Oriented x 3 ? ? ?Zenovia Jarred, MD ?[facility] ? ?07/21/2021 10:11 AM ? ?

## 2021-07-21 NOTE — Op Note (Signed)
Page ?Patient Name: Victoria Stone ?Procedure Date: 07/21/2021 10:11 AM ?MRN: [id number] ?Endoscopist: Jerene Bears , MD ?Age: 71 ?Referring MD:  ?Date of Birth: 25-Jul-1950 ?Gender: Female ?Account #: [id number] ?Procedure:                Colonoscopy ?Indications:              Screening for colorectal malignant neoplasm, Last  ?                          colonoscopy: 2012 ?Medicines:                Monitored Anesthesia Care ?Procedure:                Pre-Anesthesia Assessment: ?                          - Prior to the procedure, a History and Physical  ?                          was performed, and patient medications and  ?                          allergies were reviewed. The patient's tolerance of  ?                          previous anesthesia was also reviewed. The risks  ?                          and benefits of the procedure and the sedation  ?                          options and risks were discussed with the patient.  ?                          All questions were answered, and informed consent  ?                          was obtained. Prior Anticoagulants: The patient has  ?                          taken no previous anticoagulant or antiplatelet  ?                          agents. ASA Grade Assessment: II - A patient with  ?                          mild systemic disease. After reviewing the risks  ?                          and benefits, the patient was deemed in  ?                          satisfactory condition to undergo the procedure. ?  After obtaining informed consent, the colonoscope  ?                          was passed under direct vision. Throughout the  ?                          procedure, the patient's blood pressure, pulse, and  ?                          oxygen saturations were monitored continuously. The  ?                          Olympus PCF-H190DL (#[id number]) Colonoscope was  ?                          introduced through the anus and advanced to the   ?                          cecum, identified by appendiceal orifice and  ?                          ileocecal valve. The colonoscopy was performed  ?                          without difficulty. The patient tolerated the  ?                          procedure well. The quality of the bowel  ?                          preparation was good. The ileocecal valve,  ?                          appendiceal orifice, and rectum were photographed. ?Scope In: 10:29:30 AM ?Scope Out: 10:47:38 AM ?Scope Withdrawal Time: 0 hours 12 minutes 22 seconds  ?Total Procedure Duration: 0 hours 18 minutes 8 seconds  ?Findings:                 The perianal and digital rectal examinations were  ?                          normal. ?                          A 7 mm polyp was found in the transverse colon. The  ?                          polyp was sessile with a mucus cap. The polyp was  ?                          removed with a cold snare. Resection and retrieval  ?                          were complete. ?  A 4 mm polyp was found in the splenic flexure. The  ?                          polyp was sessile. The polyp was removed with a  ?                          cold snare. Resection and retrieval were complete. ?                          Multiple small-mouthed diverticula were found in  ?                          the sigmoid colon. ?                          Internal hemorrhoids were found during  ?                          retroflexion. The hemorrhoids were small. ?Complications:            No immediate complications. ?Estimated Blood Loss:     Estimated blood loss was minimal. ?Impression:               - One 7 mm polyp in the transverse colon, removed  ?                          with a cold snare. Resected and retrieved. ?                          - One 4 mm polyp at the splenic flexure, removed  ?                          with a cold snare. Resected and retrieved. ?                          - Diverticulosis in the  sigmoid colon. ?                          - Internal hemorrhoids. ?Recommendation:           - Patient has a contact number available for  ?                          emergencies. The signs and symptoms of potential  ?                          delayed complications were discussed with the  ?                          patient. Return to normal activities tomorrow.  ?                          Written discharge instructions were provided to the  ?  patient. ?                          - Resume previous diet. ?                          - Continue present medications. ?                          - Await pathology results. ?                          - Repeat colonoscopy is recommended. The  ?                          colonoscopy date will be determined after pathology  ?                          results from today's exam become available for  ?                          review. ?Jerene Bears, MD ?07/21/2021 10:50:38 AM ?This report has been signed electronically. ?

## 2021-07-23 ENCOUNTER — Telehealth: Payer: Self-pay | Admitting: *Deleted

## 2021-07-23 NOTE — Telephone Encounter (Signed)
?  Follow up Call- ? ? ?  07/21/2021  ?  9:31 AM  ?Call back number  ?Post procedure Call Back phone  # [phone]  ?Permission to leave phone message Yes  ?  ? ?Patient questions: ? ?Do you have a fever, pain , or abdominal swelling? No. ?Pain Score  0 * ? ?Have you tolerated food without any problems? Yes.   ? ?Have you been able to return to your normal activities? Yes.   ? ?Do you have any questions about your discharge instructions: ?Diet   No. ?Medications  No. ?Follow up visit  No. ? ?Do you have questions or concerns about your Care? No. ? ?Actions: ?* If pain score is 4 or above: ?No action needed, pain <4. ? ? ?

## 2021-07-23 NOTE — Telephone Encounter (Signed)
First follow up call attempt.  LVM. 

## 2021-07-26 ENCOUNTER — Encounter: Payer: Self-pay | Admitting: Internal Medicine

## 2021-07-27 ENCOUNTER — Ambulatory Visit (INDEPENDENT_AMBULATORY_CARE_PROVIDER_SITE_OTHER): Payer: Medicare Other | Admitting: Cardiology

## 2021-07-27 ENCOUNTER — Encounter: Payer: Self-pay | Admitting: Cardiology

## 2021-07-27 VITALS — BP 128/76 | HR 67 | Ht 65.0 in | Wt 154.8 lb

## 2021-07-27 DIAGNOSIS — E785 Hyperlipidemia, unspecified: Secondary | ICD-10-CM | POA: Diagnosis not present

## 2021-07-27 DIAGNOSIS — Z8249 Family history of ischemic heart disease and other diseases of the circulatory system: Secondary | ICD-10-CM | POA: Diagnosis not present

## 2021-07-27 DIAGNOSIS — R9431 Abnormal electrocardiogram [ECG] [EKG]: Secondary | ICD-10-CM | POA: Diagnosis not present

## 2021-07-27 LAB — ALT: ALT: 19 IU/L (ref 0–32)

## 2021-07-27 LAB — LIPID PANEL
Chol/HDL Ratio: 2.4 ratio (ref 0.0–4.4)
Cholesterol, Total: 142 mg/dL (ref 100–199)
HDL: 59 mg/dL (ref >39)
LDL Chol Calc (NIH): 63 mg/dL (ref 0–99)
Triglycerides: 114 mg/dL (ref 0–149)
VLDL Cholesterol Cal: 20 mg/dL (ref 5–40)

## 2021-07-27 NOTE — Patient Instructions (Signed)
Medication Instructions:  ?Your physician recommends that you continue on your current medications as directed. Please refer to the Current Medication list given to you today. ? ?*If you need a refill on your cardiac medications before your next appointment, please call your pharmacy* ? ? ?Lab Work: ?Fasting Lipids and ALT ?If you have labs (blood work) drawn today and your tests are completely normal, you will receive your results only by: ?MyChart Message (if you have MyChart) OR ?A paper copy in the mail ?If you have any lab test that is abnormal or we need to change your treatment, we will call you to review the results. ? ? ?Testing/Procedures: ?Your physician has requested that you have an exercise tolerance test. For further information please visit [email or web address]. Please also follow instruction sheet, as given. ? ?Your physician has requested that you have a calcium score CT scan. There is a $99 fee for the scan.  ? ?Follow-Up: ?At [facility], you and your health needs are our priority.  As part of our continuing mission to provide you with exceptional heart care, we have created designated Provider Care Teams.  These Care Teams include your primary Cardiologist (physician) and Advanced Practice Providers (APPs -  Physician Assistants and Nurse Practitioners) who all work together to provide you with the care you need, when you need it. ? ?Your next appointment:   ?1 year(s) ? ?The format for your next appointment:   ?In Person ? ?Provider:   ?Fransico Him, MD   ? ?Important Information About Sugar ? ? ? ? ?  ?

## 2021-07-27 NOTE — Progress Notes (Signed)
?Cardiology Office Note:   ? ?Date:  07/27/2021  ? ?ID:  Victoria Stone, DOB 1950/04/17, MRN [id number] ? ?PCP:  Binnie Rail, MD  ?Cardiologist:  Fransico Him, MD   ? ?Referring MD: Binnie Rail, MD  ? ?Chief Complaint  ?Patient presents with  ? Follow-up  ?  Family history of premature CAD  and hyperlipidemia  ? ? ?History of Present Illness:   ? ?Victoria Stone is a 71 y.o. female with a hx of hyperlipidemia and a strong family history of coronary disease with her brother dying of a cardiac arrest in his late 73s and autopsy showed severe CAD and also a sister with PCI's and ultimately CABG at age 80.  She had a coronary calcium score done in 2010 which was 0 and a stress test in 2018 was normal.  ? ?She is here today for followup and is doing well.  She denies any chest pain or pressure, SOB, DOE, PND, orthopnea, LE edema, dizziness, palpitations or syncope. She is compliant with her meds and is tolerating meds with no SE.    ?Past Medical History:  ?Diagnosis Date  ? ALLERGIC RHINITIS   ? ARTHRITIS   ? Arthritis of knee 11/20/2008  ? Carpal tunnel syndrome on right 04/04/2017  ? Chest pain 07/15/2015  ? Dyslipidemia   ? Family history of premature coronary artery disease 07/15/2015  ? GERD (gastroesophageal reflux disease) 02/08/2016  ? Obese   ? Prediabetes 04/06/2017  ? ULCERATIVE COLITIS   ? Ulcerative colitis ([place]) 11/20/2008  ? ? ?Past Surgical History:  ?Procedure Laterality Date  ? ABDOMINAL HYSTERECTOMY  03/15/1999  ? BONE SPUR    ? BREAST SURGERY  03/14/1986  ? Reduction  ? CARPAL TUNNEL RELEASE Right 12/31/2018  ? Procedure: RIGHT CARPAL TUNNEL RELEASE;  Surgeon: Leanora Cover, MD;  Location: [facility];  Service: Orthopedics;  Laterality: Right;  ? COLONOSCOPY  02/14/2011  ? Dr.Patterson  ? FOOT SURGERY  90, 04, 09  ? X'S 3  ? HAMMER TOE SURGERY    ? LAPAROSCOPIC GASTRIC BANDING  03/15/2003  ? TUBAL LIGATION  03/14/1986  ? ? ?Current Medications: ?Current Meds  ?Medication Sig  ? atorvastatin  (LIPITOR) 20 MG tablet TAKE 1 TABLET BY MOUTH EVERY DAY  ? Calcium Carbonate (CALCIUM 600 PO) Take by mouth daily.  ? FLUoxetine (PROZAC) 20 MG capsule TAKE 1 CAPSULE BY MOUTH EVERY DAY  ? meloxicam (MOBIC) 7.5 MG tablet TAKE 1 TABLET (7.5 MG TOTAL) BY MOUTH EVERY OTHER DAY.  ? Multiple Vitamins-Minerals (MULTIVITAMIN) tablet Take 1 tablet by mouth daily.  ? omeprazole (PRILOSEC) 20 MG capsule TAKE 1 CAPSULE BY MOUTH EVERY DAY  ? triamcinolone ointment (KENALOG) 0.5 % Apply 1 application topically 2 (two) times daily as needed (foot rash).  ? VITAMIN D, ERGOCALCIFEROL, PO Take 1,000 Int'l Units/kg by mouth.  ?  ? ?Allergies:   Penicillins and Trazodone and nefazodone  ? ?Social History  ? ?Socioeconomic History  ? Marital status: Married  ?  Spouse name: Not on file  ? Number of children: 2  ? Years of education: Not on file  ? Highest education level: Not on file  ?Occupational History  ? Occupation: Therapist, sports  ?  Employer: [employer]  ?Tobacco Use  ? Smoking status: Never  ? Smokeless tobacco: Never  ? Tobacco comments:  ?  Married, 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[facility]  ?Vaping Use  ? Vaping Use: Never used  ?Substance and  Sexual Activity  ? Alcohol use: Yes  ?  Comment: occasionally  ? Drug use: No  ? Sexual activity: Not on file  ?Other Topics Concern  ? Not on file  ?Social History Narrative  ? RN at [facility]  ? Married, lives with spouse  ? ?Social Determinants of Health  ? ?Financial Resource Strain: Low Risk   ? Difficulty of Paying Living Expenses: Not hard at all  ?Food Insecurity: No Food Insecurity  ? Worried About Charity fundraiser in the Last Year: Never true  ? Ran Out of Food in the Last Year: Never true  ?Transportation Needs: No Transportation Needs  ? Lack of Transportation (Medical): No  ? Lack of Transportation (Non-Medical): No  ?Physical Activity: Sufficiently Active  ? Days of Exercise per Week: 5 days  ? Minutes of Exercise per Session: 30 min  ?Stress: No Stress Concern Present  ?  Feeling of Stress : Not at all  ?Social Connections: Socially Integrated  ? Frequency of Communication with Friends and Family: More than three times a week  ? Frequency of Social Gatherings with Friends and Family: More than three times a week  ? Attends Religious Services: More than 4 times per year  ? Active Member of Clubs or Organizations: Yes  ? Attends Archivist Meetings: More than 4 times per year  ? Marital Status: Married  ?  ? ?Family History: ?The patient's family history includes Breast cancer in her brother and sister; Diabetes in her sister; Heart attack in her father, mother, and sister; Heart disease in her brother, father, mother, and sister; Hypertension in her father, maternal grandmother, mother, paternal grandfather, paternal grandmother, and sister; Lymphoma in her mother; Other in her mother. There is no history of Colon cancer. ? ?ROS:   ?Please see the history of present illness.    ?ROS  ?All other systems reviewed and negative.  ? ?EKGs/Labs/Other Studies Reviewed:   ? ?The following studies were reviewed today: ?none ? ?EKG:  EKG is  ordered today.  The ekg ordered today demonstrates NSR with no ST changes ? ?Recent Labs: ?11/18/2020: ALT 33; BUN 19; Creatinine, Ser 0.78; Hemoglobin 13.8; Platelets 291.0; Potassium 4.1; Sodium 139  ? ?Recent Lipid Panel ?   ?Component Value Date/Time  ? CHOL 181 11/18/2020 1113  ? CHOL 126 04/20/2020 1010  ? CHOL 189 11/07/2014 0933  ? TRIG 106.0 11/18/2020 1113  ? TRIG 159 (H) 11/07/2014 0933  ? HDL 75.90 11/18/2020 1113  ? HDL 56 04/20/2020 1010  ? HDL 62 11/07/2014 0933  ? CHOLHDL 2 11/18/2020 1113  ? VLDL 21.2 11/18/2020 1113  ? [place] 84 11/18/2020 1113  ? LDLCALC 53 04/20/2020 1010  ? [place] 95 11/07/2014 0933  ? ? ?Physical Exam:   ? ?VS:  BP 128/76   Pulse 67   Ht 5' 5"  (1.651 m)   Wt 154 lb 12.8 oz (70.2 kg)   SpO2 98%   BMI 25.76 kg/m?    ? ?Wt Readings from Last 3 Encounters:  ?07/27/21 154 lb 12.8 oz (70.2 kg)  ?07/21/21 152  lb (68.9 kg)  ?07/09/21 152 lb (68.9 kg)  ?  ?GEN: Well nourished, well developed in no acute distress ?HEENT: Normal ?NECK: No JVD; No carotid bruits ?LYMPHATICS: No lymphadenopathy ?CARDIAC:RRR, no murmurs, rubs, gallops ?RESPIRATORY:  Clear to auscultation without rales, wheezing or rhonchi  ?ABDOMEN: Soft, non-tender, non-distended ?MUSCULOSKELETAL:  No edema; No deformity  ?SKIN: Warm and dry ?NEUROLOGIC:  Alert  and oriented x 3 ?PSYCHIATRIC:  Normal affect   ?ASSESSMENT:   ? ?1. Family history of premature CAD   ?2. Dyslipidemia   ? ?PLAN:   ? ?In order of problems listed above: ? ?1.  Family hx of premature CAD ?-she has had multiple family members who have had CAD diagnosed in their 29s.  ?-Calcium score in 2010 and 2018 was 0 ?-stress test in 2018 normal ?-She has not had any anginal symptoms since I saw her last ?-Since she is 5 years out from coronary calcium score we will repeat this and exercise treadmill test ?-Shared Decision Making/Informed Consent ?The risks [chest pain, shortness of breath, cardiac arrhythmias, dizziness, blood pressure fluctuations, myocardial infarction, stroke/transient ischemic attack, and life-threatening complications (estimated to be 1 in 10,000)], benefits (risk stratification, diagnosing coronary artery disease, treatment guidance) and alternatives of an exercise tolerance test were discussed in detail with Victoria Stone and she agrees to proceed. ? ?2.  HLD ?-LDL goal < 100 ?-Check FLP and ALT ?-Continue prescription drug management atorvastatin 20 mg daily with as needed refills ? ? ?Medication Adjustments/Labs and Tests Ordered: ?Current medicines are reviewed at length with the patient today.  Concerns regarding medicines are outlined above.  ?Orders Placed This Encounter  ?Procedures  ? EKG 12-Lead  ? ?No orders of the defined types were placed in this encounter. ? ? ?Signed, ?Fransico Him, MD  ?07/27/2021 10:05 AM    ?[place] ?

## 2021-07-27 NOTE — Addendum Note (Signed)
Addended by: Antonieta Iba on: 07/27/2021 10:15 AM ? ? Modules accepted: Orders ? ?

## 2021-07-27 NOTE — Addendum Note (Signed)
Addended by: Antonieta Iba on: 07/27/2021 10:14 AM ? ? Modules accepted: Orders ? ?

## 2021-07-27 NOTE — Addendum Note (Signed)
Addended by: Fransico Him R on: 07/27/2021 10:19 AM ? ? Modules accepted: Orders ? ?

## 2021-08-02 ENCOUNTER — Other Ambulatory Visit: Payer: Self-pay | Admitting: Internal Medicine

## 2021-08-04 DIAGNOSIS — M67911 Unspecified disorder of synovium and tendon, right shoulder: Secondary | ICD-10-CM | POA: Diagnosis not present

## 2021-08-17 ENCOUNTER — Ambulatory Visit (INDEPENDENT_AMBULATORY_CARE_PROVIDER_SITE_OTHER)
Admission: RE | Admit: 2021-08-17 | Discharge: 2021-08-17 | Disposition: A | Discharge status: Home / Self Care | Payer: Self-pay | Source: Ambulatory Visit | Attending: Cardiology | Admitting: Cardiology

## 2021-08-17 ENCOUNTER — Ambulatory Visit (INDEPENDENT_AMBULATORY_CARE_PROVIDER_SITE_OTHER): Payer: Medicare Other

## 2021-08-17 DIAGNOSIS — E785 Hyperlipidemia, unspecified: Secondary | ICD-10-CM

## 2021-08-17 DIAGNOSIS — Z8249 Family history of ischemic heart disease and other diseases of the circulatory system: Secondary | ICD-10-CM

## 2021-08-17 DIAGNOSIS — R9431 Abnormal electrocardiogram [ECG] [EKG]: Secondary | ICD-10-CM

## 2021-08-17 LAB — EXERCISE TOLERANCE TEST
Estimated workload: 11.7
Exercise duration (min): 10 min
Exercise duration (sec): 0 s
MPHR: 152 {beats}/min
Peak HR: 148 {beats}/min
Percent HR: 97 %
RPE: 15
Rest HR: 71 {beats}/min
ST Depression (mm): 0 mm

## 2021-08-18 ENCOUNTER — Encounter: Payer: Self-pay | Admitting: Internal Medicine

## 2021-08-18 ENCOUNTER — Ambulatory Visit (INDEPENDENT_AMBULATORY_CARE_PROVIDER_SITE_OTHER): Payer: Medicare Other

## 2021-08-18 ENCOUNTER — Telehealth: Payer: Self-pay | Admitting: Cardiology

## 2021-08-18 DIAGNOSIS — E785 Hyperlipidemia, unspecified: Secondary | ICD-10-CM

## 2021-08-18 DIAGNOSIS — K219 Gastro-esophageal reflux disease without esophagitis: Secondary | ICD-10-CM

## 2021-08-18 DIAGNOSIS — Z23 Encounter for immunization: Secondary | ICD-10-CM | POA: Diagnosis not present

## 2021-08-18 DIAGNOSIS — F4323 Adjustment disorder with mixed anxiety and depressed mood: Secondary | ICD-10-CM

## 2021-08-18 NOTE — Patient Instructions (Signed)
Visit Information  Following are the goals we discussed today:   Manage My Medicine   Timeframe:  Long-Range Goal Priority:  Medium Start Date:   08/18/2021                          Expected End Date: 08/19/2022                      Follow Up Date 08/2022   - call for medicine refill 2 or 3 days before it runs out - call if I am sick and can't take my medicine - keep a list of all the medicines I take; vitamins and herbals too - learn to read medicine labels - use a pillbox to sort medicine    Why is this important?   These steps will help you keep on track with your medicines.  Plan: Telephone follow up appointment with care management team member scheduled for:  12 months The patient has been provided with contact information for the care management team and has been advised to call with any health related questions or concerns.   Tomasa Blase, PharmD Clinical Pharmacist, [facility]   Please call the care guide team at [phone] if you need to cancel or reschedule your appointment.   Patient verbalizes understanding of instructions and care plan provided today and agrees to view in [facility]. Active MyChart status and patient understanding of how to access instructions and care plan via MyChart confirmed with patient.

## 2021-08-18 NOTE — Telephone Encounter (Signed)
Coronary Ca mild at 5.9 which is 50th % for age, race and sex matched controls.  LDL is at goal - continue statin therapy

## 2021-08-18 NOTE — Progress Notes (Signed)
Chronic Care Management Pharmacy Note  08/18/2021 Name:  Victoria Stone MRN:  [id number] DOB:  June 01, 1950  Summary: -Patient endorses compliance with current medications, denies any issues or concerns regarding medications  -Had Calcium Score completed yesterday, waiting for review of results from cardiology office  Recommendations/Changes made from today's visit: -Recommending no changes to medications at this time, patient to reach out with any questions or concerns regarding her medications   Plan: -F/u in 12 months   Subjective: Victoria Stone is an 71 y.o. year old female who is a primary patient of Burns, Claudina Lick, MD.  The CCM team was consulted for assistance with disease management and care coordination needs.    Engaged with patient by telephone for follow up visit in response to provider referral for pharmacy case management and/or care coordination services.   Consent to Services:  The patient was given the following information about Chronic Care Management services today, agreed to services, and gave verbal consent: 1. CCM service includes personalized support from designated clinical staff supervised by the primary care provider, including individualized plan of care and coordination with other care providers 2. 24/7 contact phone numbers for assistance for urgent and routine care needs. 3. Service will only be billed when office clinical staff spend 20 minutes or more in a month to coordinate care. 4. Only one practitioner may furnish and bill the service in a calendar month. 5.The patient may stop CCM services at any time (effective at the end of the month) by phone call to the office staff. 6. The patient will be responsible for cost sharing (co-pay) of up to 20% of the service fee (after annual deductible is met). Patient agreed to services and consent obtained.  Patient Care Team: Binnie Rail, MD as PCP - General (Internal Medicine) Sueanne Margarita, MD as PCP -  Cardiology (Cardiology) Larey Dresser, MD as Consulting Physician (Cardiology) Marylynn Pearson, MD as Consulting Physician (Obstetrics and Gynecology) Sable Feil, MD (Gastroenterology) Kennith Center, RD as Dietitian (Family Medicine) Charlton Haws, [facility] as Pharmacist (Pharmacist) Katy Apo, MD as Consulting Physician (Ophthalmology)  Recent office visits: 11/18/2020 - Dr. Quay Burow - No changes to medications -f/u in 6 months   Recent consult visits: 07/27/2021 - Dr. Radford Pax - Cardiology - calcium score CT scan ordered - f/u in 1 year   Hospital visits: None in previous 6 months  Objective:  Lab Results  Component Value Date   CREATININE 0.78 11/18/2020   BUN 19 11/18/2020   GFR 78.21 11/18/2020   NA 139 11/18/2020   K 4.1 11/18/2020   CALCIUM 9.7 11/18/2020   CO2 30 11/18/2020   GLUCOSE 93 11/18/2020    Lab Results  Component Value Date/Time   HGBA1C 5.8 11/18/2020 11:13 AM   HGBA1C 5.5 05/07/2019 11:32 AM   GFR 78.21 11/18/2020 11:13 AM   GFR 66.80 05/07/2019 11:32 AM    Last diabetic Eye exam:  No results found for: HMDIABEYEEXA  Last diabetic Foot exam:  No results found for: HMDIABFOOTEX   Lab Results  Component Value Date   CHOL 142 07/27/2021   HDL 59 07/27/2021   LDLCALC 63 07/27/2021   TRIG 114 07/27/2021   CHOLHDL 2.4 07/27/2021       Latest Ref Rng & Units 07/27/2021   10:29 AM 11/18/2020   11:13 AM 04/20/2020   10:10 AM  Hepatic Function  Total Protein 6.0 - 8.3 g/dL  6.9     Albumin  3.5 - 5.2 g/dL  4.1     AST 0 - 37 U/L  25     ALT 0 - 32 IU/L 19   33   26    Alk Phosphatase 39 - 117 U/L  77     Total Bilirubin 0.2 - 1.2 mg/dL  0.7       Lab Results  Component Value Date/Time   TSH 1.94 05/07/2019 11:32 AM   TSH 2.48 05/08/2018 10:47 AM       Latest Ref Rng & Units 11/18/2020   11:13 AM 05/07/2019   11:32 AM 05/08/2018   10:47 AM  CBC  WBC 4.0 - 10.5 K/uL 6.2   5.8   6.1    Hemoglobin 12.0 - 15.0 g/dL 13.8   12.5   15.0     Hematocrit 36.0 - 46.0 % 41.2   37.2   43.4    Platelets 150.0 - 400.0 K/uL 291.0   295.0   270.0      Lab Results  Component Value Date/Time   VD25OH 48.14 11/18/2020 11:13 AM    Clinical ASCVD: No  The 10-year ASCVD risk score (Arnett DK, et al., 2019) is: 6.6%   Values used to calculate the score:     Age: 71 years     Sex: Female     Is Non-Hispanic African American: No     Diabetic: No     Tobacco smoker: No     Systolic Blood Pressure: 629 mmHg     Is BP treated: No     HDL Cholesterol: 59 mg/dL     Total Cholesterol: 142 mg/dL       09/24/2020   10:40 AM 09/11/2019   10:02 AM 05/08/2018   10:03 AM  Depression screen PHQ 2/9  Decreased Interest 0 0 0  Down, Depressed, Hopeless 0 0 0  PHQ - 2 Score 0 0 0  Altered sleeping   0  Tired, decreased energy   0  Change in appetite   0  Feeling bad or failure about yourself    0  Trouble concentrating   0  Moving slowly or fidgety/restless   0  Suicidal thoughts   0  PHQ-9 Score   0    Social History   Tobacco Use  Smoking Status Never  Smokeless Tobacco Never  Tobacco Comments   Married, 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[facility]   BP Readings from Last 3 Encounters:  07/27/21 128/76  07/21/21 (!) 127/52  11/18/20 120/76   Pulse Readings from Last 3 Encounters:  07/27/21 67  07/21/21 63  11/18/20 64   Wt Readings from Last 3 Encounters:  07/27/21 154 lb 12.8 oz (70.2 kg)  07/21/21 152 lb (68.9 kg)  07/09/21 152 lb (68.9 kg)   BMI Readings from Last 3 Encounters:  07/27/21 25.76 kg/m  07/21/21 25.29 kg/m  07/09/21 25.29 kg/m    Assessment/Interventions: Review of patient past medical history, allergies, medications, health status, including review of consultants reports, laboratory and other test data, was performed as part of comprehensive evaluation and provision of chronic care management services.   SDOH:  (Social Determinants of Health) assessments and interventions performed: Yes  SDOH  Screenings   Alcohol Screen: Low Risk    Last Alcohol Screening Score (AUDIT): 0  Depression (PHQ2-9): Low Risk    PHQ-2 Score: 0  Financial Resource Strain: Low Risk    Difficulty of Paying Living Expenses: Not hard at all  Food  Insecurity: No Food Insecurity   Worried About Charity fundraiser in the Last Year: Never true   Ran Out of Food in the Last Year: Never true  Housing: Low Risk    Last Housing Risk Score: 0  Physical Activity: Sufficiently Active   Days of Exercise per Week: 5 days   Minutes of Exercise per Session: 30 min  Social Connections: Engineer, building services of Communication with Friends and Family: More than three times a week   Frequency of Social Gatherings with Friends and Family: More than three times a week   Attends Religious Services: More than 4 times per year   Active Member of [employer] or Organizations: Yes   Attends Music therapist: More than 4 times per year   Marital Status: Married  Stress: No Stress Concern Present   Feeling of Stress : Not at all  Tobacco Use: Low Risk    Smoking Tobacco Use: Never   Smokeless Tobacco Use: Never   Passive Exposure: Not on file  Transportation Needs: No Transportation Needs   Lack of Transportation (Medical): No   Lack of Transportation (Non-Medical): No    CCM Care Plan  Allergies  Allergen Reactions   Penicillins Hives and Rash   Trazodone And Nefazodone Other (See Comments)    Affected memory    Medications Reviewed Today     Reviewed by Tomasa Blase, [facility] (Pharmacist) on 08/18/21 at [phone]  Med List Status: <None>   Medication Order Taking? Sig Documenting Provider Last Dose Status Informant  atorvastatin (LIPITOR) 20 MG tablet [id number] Yes TAKE 1 TABLET BY MOUTH EVERY DAY Burns, Claudina Lick, MD Taking Active   Calcium Carbonate (CALCIUM 600 PO) [id number] Yes Take by mouth daily. [provider] Taking Active   FLUoxetine (PROZAC) 20 MG capsule [id number] Yes TAKE 1  CAPSULE BY MOUTH EVERY DAY Burns, Claudina Lick, MD Taking Active   meloxicam (MOBIC) 7.5 MG tablet [id number] Yes TAKE 1 TABLET (7.5 MG TOTAL) BY MOUTH EVERY OTHER DAY. Binnie Rail, MD Taking Active   Multiple Vitamins-Minerals (MULTIVITAMIN) tablet [id number] Yes Take 1 tablet by mouth daily. Binnie Rail, MD Taking Active   omeprazole (PRILOSEC) 20 MG capsule [id number] Yes TAKE 1 CAPSULE BY MOUTH EVERY DAY Burns, Claudina Lick, MD Taking Active   triamcinolone ointment (KENALOG) 0.5 % [id number]  Apply 1 application topically 2 (two) times daily as needed (foot rash). Binnie Rail, MD  Active   VITAMIN D, ERGOCALCIFEROL, PO [id number] Yes Take 1,000 Units by mouth. [provider] Taking Active             Patient Active Problem List   Diagnosis Date Noted   Vitamin D deficiency 11/18/2020   Sleep difficulties 11/06/2019   Trigger finger of right thumb 10/09/2019   Prediabetes 04/06/2017   Bilateral carpal tunnel syndrome 04/04/2017   Family history of premature CAD 07/20/2016   GERD (gastroesophageal reflux disease) 02/08/2016   Chest pain 07/15/2015   Dyslipidemia    Adjustment disorder with mixed anxiety and depressed mood 11/20/2008   ALLERGIC RHINITIS 11/20/2008   Ulcerative colitis ([place]) 11/20/2008   Arthritis of knee 11/20/2008    Immunization History  Administered Date(s) Administered   Fluad Quad(high Dose 65+) 12/14/2018   Influenza Split 12/13/2011, 12/13/2014   Influenza, High Dose Seasonal PF 12/08/2017   Influenza-Unspecified 12/13/2015, 12/29/2016   PFIZER Comirnaty(Gray Top)Covid-19 Tri-Sucrose Vaccine 06/13/2020   PFIZER(Purple Top)SARS-COV-2 Vaccination 04/03/2019, 04/24/2019, 11/26/2019   Pneumococcal Conjugate-13  10/31/2017   Pneumococcal Polysaccharide-23 05/13/2020   Tdap 12/06/2010, 07/23/2020   Zoster Recombinat (Shingrix) 04/05/2017, 06/08/2017   Zoster, Live 10/26/2012    Conditions to be addressed/monitored:  Hyperlipidemia, Depression, and  Anxiety  Care Plan : [place]  Updates made by Tomasa Blase, RPH since 08/18/2021 12:00 AM     Problem: Hyperlipidemia, Depression, and Anxiety   Priority: High  Onset Date: 08/18/2021     Long-Range Goal: Disease Management   Start Date: 08/18/2021  Expected End Date: 08/19/2022  This Visit's Progress: On track  Priority: High  Note:   Current Barriers:  Chronic Disease Management support, education, and care coordination needs related to Hyperlipidemia, Depression, and Anxiety  Hyperlipidemia Lab Results  Component Value Date/Time   LDLCALC 63 07/27/2021 10:29 AM   [place] 95 11/07/2014 09:33 AM  Pharmacist Clinical Goal(s): Patient will work with PharmD and providers to maintain LDL goal < 100 Current regimen:  Atorvastatin 20 mg daily Interventions: Discussed cholesterol goals and benefits of medications for prevention of heart attack / stroke Patient self care activities - Patient will: Continue medication as prescribed  Prediabetes Lab Results  Component Value Date/Time   HGBA1C 5.8 11/18/2020 11:13 AM   HGBA1C 5.5 05/07/2019 11:32 AM  Pharmacist Clinical Goal(s): Patient will work with PharmD and providers to maintain A1c goal <6.5% Current regimen:  No medication Interventions: Discussed importance of maintaining sugars at goal to prevent complications of diabetes including kidney damage, retinal damage, and cardiovascular disease Patient self care activities -Patient will: Continue control with diet and exercise  Depression / Anxiety / Sleep issues Pharmacist Clinical Goal(s) Patient will work with PharmD and providers to optimize therapy Current regimen:  Fluoxetine 20 mg daily Interventions: No changes at this time, pt doing well Patient self care activities - Patient will: Continue medication as prescribed  GERD Pharmacist Clinical Goal(s) Patient will work with PharmD and providers to optimize therapy Current regimen:  Omeprazole 20  mg daily as needed Interventions: Discussed long term risk associated with PPIs including bone loss Patient will: Continue medication as directed  Medication management Pharmacist Clinical Goal(s): Patient will work with PharmD and providers to maintain optimal medication adherence Current pharmacy: [facility] Interventions Comprehensive medication review performed. Continue current medication management strategy Patient self care activities - Patient will: Focus on medication adherence by [facility] Take medications as prescribed Report any questions or concerns to PharmD and/or provider(s)       Medication Assistance: None required.  Patient affirms current coverage meets needs.  Care Gaps: COVID booster Mammogram  Patient's preferred pharmacy is:  [id number] [place], [place] [id number]: [phone]: [phone]  Uses pill box? Yes Pt endorses 100% compliance  Care Plan and Follow Up Patient Decision:  Patient agrees to Care Plan and Follow-up.  Plan: Telephone follow up appointment with care management team member scheduled for:  12 months The patient has been provided with contact information for the care management team and has been advised to call with any health related questions or concerns.   DTomasa Blase PharmD Clinical Pharmacist, [place]

## 2021-08-18 NOTE — Telephone Encounter (Signed)
Pt aware of calcium score results .Victoria Stone

## 2021-08-18 NOTE — Telephone Encounter (Signed)
Patient is returning RN's call regarding her CT results.

## 2021-09-09 ENCOUNTER — Other Ambulatory Visit: Payer: Self-pay | Admitting: Internal Medicine

## 2021-09-10 DIAGNOSIS — E785 Hyperlipidemia, unspecified: Secondary | ICD-10-CM

## 2021-09-10 DIAGNOSIS — F32A Depression, unspecified: Secondary | ICD-10-CM

## 2021-10-04 ENCOUNTER — Ambulatory Visit: Payer: Medicare Other

## 2021-10-11 ENCOUNTER — Other Ambulatory Visit: Payer: Self-pay | Admitting: Internal Medicine

## 2021-10-15 ENCOUNTER — Ambulatory Visit (INDEPENDENT_AMBULATORY_CARE_PROVIDER_SITE_OTHER): Payer: Medicare Other

## 2021-10-15 DIAGNOSIS — Z0000 Encounter for general adult medical examination without abnormal findings: Secondary | ICD-10-CM

## 2021-10-15 NOTE — Progress Notes (Signed)
Subjective:   Victoria Stone is a 71 y.o. female who presents for Medicare Annual (Subsequent) preventive examination.   I connected with Kanija Remmel  today by telephone and verified that I am speaking with the correct person using two identifiers. Location patient: home Location provider: work Persons participating in the virtual visit: patient, provider.   I discussed the limitations, risks, security and privacy concerns of performing an evaluation and management service by telephone and the availability of in person appointments. I also discussed with the patient that there may be a patient responsible charge related to this service. The patient expressed understanding and verbally consented to this telephonic visit.    Interactive audio and video telecommunications were attempted between this provider and patient, however failed, due to patient having technical difficulties OR patient did not have access to video capability.  We continued and completed visit with audio only.    Review of Systems     Cardiac Risk Factors include: advanced age (>60mn, >>22women)     Objective:    Today's Vitals   There is no height or weight on file to calculate BMI.     10/15/2021   10:02 AM 09/24/2020   10:39 AM 09/11/2019   10:04 AM 12/31/2018   11:28 AM 12/24/2018   10:36 AM 09/24/2015    8:02 AM  Advanced Directives  Does Patient Have a Medical Advance Directive? Yes Yes Yes Yes Yes Yes  Type of AParamedicof AGrand BlancLiving will HPerrysvilleLiving will HLaguna VistaLiving will HNewtoniaLiving will HStonefortOut of facility DNR (pink MOST or yellow form)   Does patient want to make changes to medical advance directive?  No - Patient declined No - Patient declined No - Patient declined    Copy of HHigh Pointin Chart? No - copy requested No - copy requested No - copy requested        Current Medications (verified) Outpatient Encounter Medications as of 10/15/2021  Medication Sig   atorvastatin (LIPITOR) 20 MG tablet TAKE 1 TABLET BY MOUTH EVERY DAY   Calcium Carbonate (CALCIUM 600 PO) Take by mouth daily.   FLUoxetine (PROZAC) 20 MG capsule TAKE 1 CAPSULE BY MOUTH EVERY DAY   meloxicam (MOBIC) 7.5 MG tablet TAKE 1 TABLET (7.5 MG TOTAL) BY MOUTH EVERY OTHER DAY.   Multiple Vitamins-Minerals (MULTIVITAMIN) tablet Take 1 tablet by mouth daily.   omeprazole (PRILOSEC) 20 MG capsule TAKE 1 CAPSULE BY MOUTH EVERY DAY   triamcinolone ointment (KENALOG) 0.5 % Apply 1 application topically 2 (two) times daily as needed (foot rash).   VITAMIN D, ERGOCALCIFEROL, PO Take 1,000 Units by mouth.   No facility-administered encounter medications on file as of 10/15/2021.    Allergies (verified) Penicillins and Trazodone and nefazodone   History: Past Medical History:  Diagnosis Date   ALLERGIC RHINITIS    ARTHRITIS    Arthritis of knee 11/20/2008   Carpal tunnel syndrome on right 04/04/2017   Chest pain 07/15/2015   Dyslipidemia    Family history of premature coronary artery disease 07/15/2015   GERD (gastroesophageal reflux disease) 02/08/2016   Obese    Prediabetes 04/06/2017   ULCERATIVE COLITIS    Ulcerative colitis ([place] 11/20/2008   Past Surgical History:  Procedure Laterality Date   ABDOMINAL HYSTERECTOMY  03/15/1999   BONE SPUR     BREAST SURGERY  03/14/1986   Reduction   CARPAL TUNNEL RELEASE Right  12/31/2018   Procedure: RIGHT CARPAL TUNNEL RELEASE;  Surgeon: Leanora Cover, MD;  Location: [facility];  Service: Orthopedics;  Laterality: Right;   COLONOSCOPY  02/14/2011   Dr.Patterson   FOOT SURGERY  90, 04, 09   X'S 3   HAMMER TOE SURGERY     LAPAROSCOPIC GASTRIC BANDING  03/15/2003   TUBAL LIGATION  03/14/1986   Family History  Problem Relation Age of Onset   Hypertension Mother    Lymphoma Mother    Other Mother        PACEMAKER   Heart  disease Mother    Heart attack Mother    Hypertension Father    Heart disease Father    Heart attack Father    Hypertension Sister    Diabetes Sister    Heart attack Sister    Heart disease Sister    Breast cancer Sister    Heart disease Brother    Breast cancer Brother    Hypertension Maternal Grandmother    Hypertension Paternal Grandmother    Hypertension Paternal Grandfather    Colon cancer Neg Hx    Social History   Socioeconomic History   Marital status: Married    Spouse name: Not on file   Number of children: 2   Years of education: Not on file   Highest education level: Not on file  Occupational History   Occupation: Therapist, sports    Employer: [employer]  Tobacco Use   Smoking status: Never   Smokeless tobacco: Never   Tobacco comments:    Married, 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[facility] [employer]  Vaping Use   Vaping Use: Never used  Substance and Sexual Activity   Alcohol use: Yes    Comment: occasionally   Drug use: No   Sexual activity: Not on file  Other Topics Concern   Not on file  Social History Narrative   RN at [employer]   Married, lives with spouse   Social Determinants of Health   Financial Resource Strain: Low Risk  (10/15/2021)   Overall Financial Resource Strain (CARDIA)    Difficulty of Paying Living Expenses: Not hard at all  Food Insecurity: No Food Insecurity (10/15/2021)   Hunger Vital Sign    Worried About Running Out of Food in the Last Year: Never true    [place] in the Last Year: Never true  Transportation Needs: No Transportation Needs (10/15/2021)   PRAPARE - Hydrologist (Medical): No    Lack of Transportation (Non-Medical): No  Physical Activity: Sufficiently Active (10/15/2021)   Exercise Vital Sign    Days of Exercise per Week: 5 days    Minutes of Exercise per Session: 60 min  Stress: No Stress Concern Present (10/15/2021)   [place]     Feeling of Stress : Not at all  Social Connections: [place] (10/15/2021)   Social Connection and Isolation Panel [NHANES]    Frequency of Communication with Friends and Family: Three times a week    Frequency of Social Gatherings with Friends and Family: Three times a week    Attends Religious Services: More than 4 times per year    Active Member of Clubs or Organizations: Yes    Attends Archivist Meetings: More than 4 times per year    Marital Status: Married    Tobacco Counseling Counseling given: Not Answered Tobacco comments: Married, lives with spouse. RN  at [employer]   Clinical Intake:  Pre-visit preparation completed: Yes  Pain : No/denies pain     Nutritional Risks: None Diabetes: No  How often do you need to have someone help you when you read instructions, pamphlets, or other written materials from your doctor or pharmacy?: 1 - Never What is the last grade level you completed in school?: college  Diabetic?no   Interpreter Needed?: No  Information entered by :: L.Wilson,LPN   Activities of Daily Living    10/15/2021   10:06 AM  In your present state of health, do you have any difficulty performing the following activities:  Hearing? 0  Vision? 0  Difficulty concentrating or making decisions? 0  Walking or climbing stairs? 0  Dressing or bathing? 0  Doing errands, shopping? 0  Preparing Food and eating ? N  Using the Toilet? N  In the past six months, have you accidently leaked urine? N  Do you have problems with loss of bowel control? N  Managing your Medications? N  Managing your Finances? N  Housekeeping or managing your Housekeeping? N    Patient Care Team: Binnie Rail, MD as PCP - General (Internal Medicine) Sueanne Margarita, MD as PCP - Cardiology (Cardiology) Larey Dresser, MD as Consulting Physician (Cardiology) Marylynn Pearson, MD as Consulting Physician (Obstetrics and Gynecology) Sable Feil, MD  (Gastroenterology) Kennith Center, RD as Dietitian (Family Medicine) Charlton Haws, [facility] as Pharmacist (Pharmacist) Katy Apo, MD as Consulting Physician (Ophthalmology)  Indicate any recent Medical Services you may have received from other than [facility] providers in the past year (date may be approximate).     Assessment:   This is a routine wellness examination for Brynnley.  Hearing/Vision screen Vision Screening - Comments:: Annual eye exams wears glasses   Dietary issues and exercise activities discussed: Current Exercise Habits: Home exercise routine, Type of exercise: walking, Time (Minutes): 30, Frequency (Times/Week): 4, Weekly Exercise (Minutes/Week): 120, Intensity: Mild, Exercise limited by: None identified   Goals Addressed   None    Depression Screen    10/15/2021   10:03 AM 10/15/2021   10:01 AM 09/24/2020   10:40 AM 09/11/2019   10:02 AM 05/08/2018   10:03 AM 04/04/2017    8:09 AM 09/24/2015    8:02 AM  PHQ 2/9 Scores  PHQ - 2 Score 0 0 0 0 0 0 0  PHQ- 9 Score     0      Fall Risk    10/15/2021   10:03 AM 09/24/2020   10:40 AM 09/11/2019   10:05 AM 05/08/2018   10:03 AM 04/04/2017    8:09 AM  Fall Risk   Falls in the past year? 0 0 0 0 No  Number falls in past yr: 0 0 0    Injury with Fall? 0 0 0    Risk for fall due to :  No Fall Risks No Fall Risks    Follow up Falls evaluation completed;Education provided Falls evaluation completed Falls evaluation completed      [place]:  Any stairs in or around the home? Yes  If so, are there any without handrails? No  Home free of loose throw rugs in walkways, pet beds, electrical cords, etc? Yes  Adequate lighting in your home to reduce risk of falls? Yes   ASSISTIVE DEVICES UTILIZED TO PREVENT FALLS:  Life alert? No  Use of a cane, walker or w/c? No  Grab bars in the bathroom? Yes  Shower chair or bench in shower? Yes  Elevated toilet seat or a handicapped toilet? No     Cognitive Function:Normal cognitive status assessed by telephone conversation  by this Nurse Health Advisor. No abnormalities found.      09/11/2019   10:06 AM  MMSE - Mini Mental State Exam  Not completed: Refused        10/15/2021   10:07 AM 09/11/2019   10:07 AM  6CIT Screen  What Year? 0 points 0 points  What month? 0 points 0 points  What time? 0 points 0 points  Count back from 20 0 points 0 points  Months in reverse 0 points 0 points  Repeat phrase 0 points 0 points  Total Score 0 points 0 points    Immunizations Immunization History  Administered Date(s) Administered   Fluad Quad(high Dose 65+) 12/14/2018   Influenza Split 12/13/2011, 12/13/2014   Influenza, High Dose Seasonal PF 12/08/2017   Influenza-Unspecified 12/13/2015, 12/29/2016   PFIZER Comirnaty(Gray Top)Covid-19 Tri-Sucrose Vaccine 06/13/2020   PFIZER(Purple Top)SARS-COV-2 Vaccination 04/03/2019, 04/24/2019, 11/26/2019   Pfizer Covid-19 Vaccine Bivalent Booster 4yr & up 08/18/2021   Pneumococcal Conjugate-13 10/31/2017   Pneumococcal Polysaccharide-23 05/13/2020   Tdap 12/06/2010, 07/23/2020   Zoster Recombinat (Shingrix) 04/05/2017, 06/08/2017   Zoster, Live 10/26/2012    TDAP status: Up to date  Flu Vaccine status: Up to date  Pneumococcal vaccine status: Up to date  Covid-19 vaccine status: Completed vaccines  Qualifies for Shingles Vaccine? Yes   Zostavax completed Yes   Shingrix Completed?: Yes  Screening Tests Health Maintenance  Topic Date Due   MAMMOGRAM  07/01/2020   INFLUENZA VACCINE  10/12/2021   COVID-19 Vaccine (6 - Pfizer series) 12/18/2021   DEXA SCAN  05/24/2023   COLONOSCOPY (Pts 45-422yrInsurance coverage will need to be confirmed)  07/22/2026   TETANUS/TDAP  07/24/2030   Pneumonia Vaccine 6553Years old  Completed   Hepatitis C Screening  Completed   Zoster Vaccines- Shingrix  Completed   HPV VACCINES  Aged Out    Health Maintenance  Health Maintenance Due   Topic Date Due   MAMMOGRAM  07/01/2020   INFLUENZA VACCINE  10/12/2021    Colorectal cancer screening: Type of screening: Colonoscopy. Completed 07/21/2021. Repeat every 10 years  Mammogram status: Ordered schedule 11/2021. Pt provided with contact info and advised to call to schedule appt.   Bone Density status: Completed 05/24/2018. Results reflect: Bone density results: OSTEOPENIA. Repeat every 5 years.  Lung Cancer Screening: (Low Dose CT Chest recommended if Age 69-80ears, 30 pack-year currently smoking OR have quit w/in 15years.) does not qualify.   Lung Cancer Screening Referral: n/a  Additional Screening:  Hepatitis C Screening: does not qualify;   Vision Screening: Recommended/ annual ophthalmology exams for ear/ly detection of glaucoma and other disorders of the eye. Is the patient up to date with their annual eye exam?  Yes  Who is the provider or what is the name of the office in which the patient [facility]ends annual [facility]  If pt is not established with a provider, would they like to be referred to a provider to establish care? No .   Dental Screening: Recommended annual dental exams for proper oral hygiene  Community Resource Referral / Chronic Care Management: CRR required this visit?  No   CCM required this visit?  No      Plan:     I have personally reviewed and  noted the following in the patient's chart:   Medical and social history Use of alcohol, tobacco or illicit drugs  Current medications and supplements including opioid prescriptions.  Functional ability and status Nutritional status Physical activity Advanced directives List of other physicians Hospitalizations, surgeries, and ER visits in previous 12 months Vitals Screenings to include cognitive, depression, and falls Referrals and appointments  In addition, I have reviewed and discussed with patient certain preventive protocols, quality metrics, and best practice  recommendations. A written personalized care plan for preventive services as well as general preventive health recommendations were provided to patient.     Daphane Shepherd, LPN   06/18/3401   Nurse Notes: none

## 2021-10-15 NOTE — Patient Instructions (Signed)
Victoria Stone , Thank you for taking time to come for your Medicare Wellness Visit. I appreciate your ongoing commitment to your health goals. Please review the following plan we discussed and let me know if I can assist you in the future.   Screening recommendations/referrals: Colonoscopy: 07/21/2020 Mammogram: scheduled  11/2021 Bone Density: 05/24/2018 Recommended yearly ophthalmology/optometry visit for glaucoma screening and checkup Recommended yearly dental visit for hygiene and checkup  Vaccinations: Influenza vaccine: completed  Pneumococcal vaccine: completed  Tdap vaccine: 07/23/2020 Shingles vaccine: completed     Advanced directives: yes   Conditions/risks identified: none   Next appointment: none    Preventive Care 69 Years and Older, Female Preventive care refers to lifestyle choices and visits with your health care provider that can promote health and wellness. What does preventive care include? A yearly physical exam. This is also called an annual well check. Dental exams once or twice a year. Routine eye exams. Ask your health care provider how often you should have your eyes checked. Personal lifestyle choices, including: Daily care of your teeth and gums. Regular physical activity. Eating a healthy diet. Avoiding tobacco and drug use. Limiting alcohol use. Practicing safe sex. Taking low-dose aspirin every day. Taking vitamin and mineral supplements as recommended by your health care provider. What happens during an annual well check? The services and screenings done by your health care provider during your annual well check will depend on your age, overall health, lifestyle risk factors, and family history of disease. Counseling  Your health care provider may ask you questions about your: Alcohol use. Tobacco use. Drug use. Emotional well-being. Home and relationship well-being. Sexual activity. Eating habits. History of falls. Memory and ability to  understand (cognition). Work and work Statistician. Reproductive health. Screening  You may have the following tests or measurements: Height, weight, and BMI. Blood pressure. Lipid and cholesterol levels. These may be checked every 5 years, or more frequently if you are over 64 years old. Skin check. Lung cancer screening. You may have this screening every year starting at age 69 if you have a 30-pack-year history of smoking and currently smoke or have quit within the past 15 years. Fecal occult blood test (FOBT) of the stool. You may have this test every year starting at age 69. Flexible sigmoidoscopy or colonoscopy. You may have a sigmoidoscopy every 5 years or a colonoscopy every 10 years starting at age 69. Hepatitis C blood test. Hepatitis B blood test. Sexually transmitted disease (STD) testing. Diabetes screening. This is done by checking your blood sugar (glucose) after you have not eaten for a while (fasting). You may have this done every 1-3 years. Bone density scan. This is done to screen for osteoporosis. You may have this done starting at age 69. Mammogram. This may be done every 1-2 years. Talk to your health care provider about how often you should have regular mammograms. Talk with your health care provider about your test results, treatment options, and if necessary, the need for more tests. Vaccines  Your health care provider may recommend certain vaccines, such as: Influenza vaccine. This is recommended every year. Tetanus, diphtheria, and acellular pertussis (Tdap, Td) vaccine. You may need a Td booster every 10 years. Zoster vaccine. You may need this after age 12. Pneumococcal 13-valent conjugate (PCV13) vaccine. One dose is recommended after age 69. Pneumococcal polysaccharide (PPSV23) vaccine. One dose is recommended after age 69. Talk to your health care provider about which screenings and vaccines you need and how  often you need them. This information is not  intended to replace advice given to you by your health care provider. Make sure you discuss any questions you have with your health care provider. Document Released: 03/27/2015 Document Revised: 11/18/2015 Document Reviewed: 12/30/2014 Elsevier Interactive Patient Education  2017 [place] Prevention in the Home Falls can cause injuries. They can happen to people of all ages. There are many things you can do to make your home safe and to help prevent falls. What can I do on the outside of my home? Regularly fix the edges of walkways and driveways and fix any cracks. Remove anything that might make you trip as you walk through a door, such as a raised step or threshold. Trim any bushes or trees on the path to your home. Use bright outdoor lighting. Clear any walking paths of anything that might make someone trip, such as rocks or tools. Regularly check to see if handrails are loose or broken. Make sure that both sides of any steps have handrails. Any raised decks and porches should have guardrails on the edges. Have any leaves, snow, or ice cleared regularly. Use sand or salt on walking paths during winter. Clean up any spills in your garage right away. This includes oil or grease spills. What can I do in the bathroom? Use night lights. Install grab bars by the toilet and in the tub and shower. Do not use towel bars as grab bars. Use non-skid mats or decals in the tub or shower. If you need to sit down in the shower, use a plastic, non-slip stool. Keep the floor dry. Clean up any water that spills on the floor as soon as it happens. Remove soap buildup in the tub or shower regularly. Attach bath mats securely with double-sided non-slip rug tape. Do not have throw rugs and other things on the floor that can make you trip. What can I do in the bedroom? Use night lights. Make sure that you have a light by your bed that is easy to reach. Do not use any sheets or blankets that are  too big for your bed. They should not hang down onto the floor. Have a firm chair that has side arms. You can use this for support while you get dressed. Do not have throw rugs and other things on the floor that can make you trip. What can I do in the kitchen? Clean up any spills right away. Avoid walking on wet floors. Keep items that you use a lot in easy-to-reach places. If you need to reach something above you, use a strong step stool that has a grab bar. Keep electrical cords out of the way. Do not use floor polish or wax that makes floors slippery. If you must use wax, use non-skid floor wax. Do not have throw rugs and other things on the floor that can make you trip. What can I do with my stairs? Do not leave any items on the stairs. Make sure that there are handrails on both sides of the stairs and use them. Fix handrails that are broken or loose. Make sure that handrails are as long as the stairways. Check any carpeting to make sure that it is firmly attached to the stairs. Fix any carpet that is loose or worn. Avoid having throw rugs at the top or bottom of the stairs. If you do have throw rugs, attach them to the floor with carpet tape. Make sure that you have a  light switch at the top of the stairs and the bottom of the stairs. If you do not have them, ask someone to add them for you. What else can I do to help prevent falls? Wear shoes that: Do not have high heels. Have rubber bottoms. Are comfortable and fit you well. Are closed at the toe. Do not wear sandals. If you use a stepladder: Make sure that it is fully opened. Do not climb a closed stepladder. Make sure that both sides of the stepladder are locked into place. Ask someone to hold it for you, if possible. Clearly mark and make sure that you can see: Any grab bars or handrails. First and last steps. Where the edge of each step is. Use tools that help you move around (mobility aids) if they are needed. These  include: Canes. Walkers. Scooters. Crutches. Turn on the lights when you go into a dark area. Replace any light bulbs as soon as they burn out. Set up your furniture so you have a clear path. Avoid moving your furniture around. If any of your floors are uneven, fix them. If there are any pets around you, be aware of where they are. Review your medicines with your doctor. Some medicines can make you feel dizzy. This can increase your chance of falling. Ask your doctor what other things that you can do to help prevent falls. This information is not intended to replace advice given to you by your health care provider. Make sure you discuss any questions you have with your health care provider. Document Released: 12/25/2008 Document Revised: 08/06/2015 Document Reviewed: 04/04/2014 Elsevier Interactive Patient Education  2017 [employer].

## 2021-10-27 ENCOUNTER — Other Ambulatory Visit: Payer: Self-pay | Admitting: Internal Medicine

## 2021-11-08 ENCOUNTER — Other Ambulatory Visit: Payer: Self-pay | Admitting: Internal Medicine

## 2021-11-16 ENCOUNTER — Encounter: Payer: Self-pay | Admitting: Internal Medicine

## 2021-11-18 ENCOUNTER — Ambulatory Visit: Payer: Medicare Other | Admitting: Family Medicine

## 2021-11-22 DIAGNOSIS — L720 Epidermal cyst: Secondary | ICD-10-CM | POA: Diagnosis not present

## 2021-11-22 DIAGNOSIS — L821 Other seborrheic keratosis: Secondary | ICD-10-CM | POA: Diagnosis not present

## 2021-11-22 DIAGNOSIS — Z85828 Personal history of other malignant neoplasm of skin: Secondary | ICD-10-CM | POA: Diagnosis not present

## 2021-11-24 DIAGNOSIS — Z6827 Body mass index (BMI) 27.0-27.9, adult: Secondary | ICD-10-CM | POA: Diagnosis not present

## 2021-11-24 DIAGNOSIS — Z01419 Encounter for gynecological examination (general) (routine) without abnormal findings: Secondary | ICD-10-CM | POA: Diagnosis not present

## 2021-11-24 DIAGNOSIS — Z1231 Encounter for screening mammogram for malignant neoplasm of breast: Secondary | ICD-10-CM | POA: Diagnosis not present

## 2021-11-24 LAB — HM MAMMOGRAPHY

## 2021-12-01 ENCOUNTER — Other Ambulatory Visit: Payer: Self-pay | Admitting: Internal Medicine

## 2021-12-02 ENCOUNTER — Encounter: Payer: Self-pay | Admitting: Internal Medicine

## 2021-12-02 NOTE — Progress Notes (Signed)
Outside notes received. Information abstracted. Notes sent to scan.  

## 2021-12-15 DIAGNOSIS — Z23 Encounter for immunization: Secondary | ICD-10-CM | POA: Diagnosis not present

## 2021-12-21 ENCOUNTER — Encounter: Payer: Self-pay | Admitting: Internal Medicine

## 2021-12-21 NOTE — Progress Notes (Unsigned)
    Subjective:    Patient ID: Victoria Stone, female    DOB: 07-Oct-1950, 71 y.o.   MRN: [id number]      HPI Victoria Stone is here for No chief complaint on file.    Laryngitis -     Medications and allergies reviewed with patient and updated if appropriate.  Current Outpatient Medications on File Prior to Visit  Medication Sig Dispense Refill   atorvastatin (LIPITOR) 20 MG tablet TAKE 1 TABLET BY MOUTH EVERY DAY 90 tablet 0   Calcium Carbonate (CALCIUM 600 PO) Take by mouth daily.     FLUoxetine (PROZAC) 20 MG capsule TAKE 1 CAPSULE BY MOUTH EVERY DAY 30 capsule 0   meloxicam (MOBIC) 7.5 MG tablet TAKE 1 TABLET (7.5 MG TOTAL) BY MOUTH EVERY OTHER DAY. 45 tablet 0   Multiple Vitamins-Minerals (MULTIVITAMIN) tablet Take 1 tablet by mouth daily.     omeprazole (PRILOSEC) 20 MG capsule TAKE 1 CAPSULE BY MOUTH EVERY DAY 90 capsule 1   triamcinolone ointment (KENALOG) 0.5 % Apply 1 application topically 2 (two) times daily as needed (foot rash). 30 g 1   VITAMIN D, ERGOCALCIFEROL, PO Take 1,000 Units by mouth.     No current facility-administered medications on file prior to visit.    Review of Systems     Objective:  There were no vitals filed for this visit. BP Readings from Last 3 Encounters:  07/27/21 128/76  07/21/21 (!) 127/52  11/18/20 120/76   Wt Readings from Last 3 Encounters:  07/27/21 154 lb 12.8 oz (70.2 kg)  07/21/21 152 lb (68.9 kg)  07/09/21 152 lb (68.9 kg)   There is no height or weight on file to calculate BMI.    Physical Exam         Assessment & Plan:    See Problem List for Assessment and Plan of chronic medical problems.

## 2021-12-22 ENCOUNTER — Ambulatory Visit (INDEPENDENT_AMBULATORY_CARE_PROVIDER_SITE_OTHER): Payer: Medicare Other | Admitting: Internal Medicine

## 2021-12-22 VITALS — BP 124/88 | HR 74 | Temp 98.0°F | Ht 65.0 in

## 2021-12-22 DIAGNOSIS — J040 Acute laryngitis: Secondary | ICD-10-CM | POA: Insufficient documentation

## 2021-12-22 MED ORDER — METHYLPREDNISOLONE 4 MG PO TBPK: ORAL_TABLET | ORAL | 0 refills | Status: DC

## 2021-12-22 NOTE — Assessment & Plan Note (Signed)
Subacute She had an episode of laryngitis that started with difficulty in of August, which resolved but then returned at the end of the cold Reassured her this is unlikely polyp or tumor Likely dysfunction of the vocal cords or vocal cord edema Will refer to ENT We will go ahead and try Medrol Dosepak Advised blistering/increased rest Stressed making sure her GERD is well controlled and her drainage, which can both irritate the vocal cords  Discussed that the Medrol Dosepak could cause a flare of her GERD and to take all precautions against it Call with questions

## 2021-12-22 NOTE — Patient Instructions (Addendum)
   Medications changes include :   medrol dose pak   Your prescription(s) have been sent to your pharmacy.    A referral was ordered for ENT.     Someone from that office will call you to schedule an appointment.    Return if symptoms worsen or fail to improve.

## 2022-01-08 ENCOUNTER — Other Ambulatory Visit: Payer: Self-pay | Admitting: Internal Medicine

## 2022-01-22 ENCOUNTER — Other Ambulatory Visit: Payer: Self-pay | Admitting: Internal Medicine

## 2022-02-15 DIAGNOSIS — R09A2 Foreign body sensation, throat: Secondary | ICD-10-CM | POA: Diagnosis not present

## 2022-02-15 DIAGNOSIS — R490 Dysphonia: Secondary | ICD-10-CM | POA: Diagnosis not present

## 2022-02-15 DIAGNOSIS — K219 Gastro-esophageal reflux disease without esophagitis: Secondary | ICD-10-CM | POA: Diagnosis not present

## 2022-02-15 DIAGNOSIS — R059 Cough, unspecified: Secondary | ICD-10-CM | POA: Diagnosis not present

## 2022-02-25 ENCOUNTER — Other Ambulatory Visit: Payer: Self-pay | Admitting: Internal Medicine

## 2022-06-21 DIAGNOSIS — Z85828 Personal history of other malignant neoplasm of skin: Secondary | ICD-10-CM | POA: Diagnosis not present

## 2022-06-21 DIAGNOSIS — L905 Scar conditions and fibrosis of skin: Secondary | ICD-10-CM | POA: Diagnosis not present

## 2022-07-30 ENCOUNTER — Other Ambulatory Visit: Payer: Self-pay | Admitting: Internal Medicine

## 2022-07-31 NOTE — Progress Notes (Unsigned)
Subjective:    Patient ID: Victoria Stone, female    DOB: March 29, 1950, 72 y.o.   MRN: [id number]     HPI Victoria Stone is here for follow up of her chronic medical problems.  Still with cough, wheeze.  Laryngitis is still present, but better.  She did see ENT.  She does state reflux nightly.   Pilates once a week.  She is walking 3-4 times a week.  Medications and allergies reviewed with patient and updated if appropriate.  Current Outpatient Medications on File Prior to Visit  Medication Sig Dispense Refill   atorvastatin (LIPITOR) 20 MG tablet TAKE 1 TABLET BY MOUTH EVERY DAY 90 tablet 0   Calcium Carbonate (CALCIUM 600 PO) Take by mouth daily.     Multiple Vitamins-Minerals (MULTIVITAMIN) tablet Take 1 tablet by mouth daily.     omeprazole (PRILOSEC) 20 MG capsule TAKE 1 CAPSULE BY MOUTH EVERY DAY 90 capsule 1   triamcinolone ointment (KENALOG) 0.5 % Apply 1 application topically 2 (two) times daily as needed (foot rash). 30 g 1   VITAMIN D, ERGOCALCIFEROL, PO Take 1,000 Units by mouth.     FLUoxetine (PROZAC) 20 MG capsule TAKE 1 CAPSULE BY MOUTH EVERY DAY 90 capsule 1   meloxicam (MOBIC) 7.5 MG tablet TAKE 1 TABLET (7.5 MG TOTAL) BY MOUTH EVERY OTHER DAY. 45 tablet 0   No current facility-administered medications on file prior to visit.     Review of Systems  Constitutional:  Negative for fever.  Respiratory:  Positive for cough (from reflux) and wheezing (at night - reflux related). Negative for shortness of breath.   Gastrointestinal:  Negative for abdominal pain, blood in stool, constipation and diarrhea.  Neurological:  Negative for light-headedness and headaches.       Objective:   Vitals:   08/01/22 0850  BP: 126/80  Pulse: 70  Temp: 98.2 F (36.8 C)  SpO2: 95%   BP Readings from Last 3 Encounters:  08/01/22 126/80  12/22/21 124/88  07/27/21 128/76   Wt Readings from Last 3 Encounters:  08/01/22 159 lb (72.1 kg)  07/27/21 154 lb 12.8 oz (70.2 kg)   07/21/21 152 lb (68.9 kg)   Body mass index is 26.46 kg/m.    Physical Exam Constitutional:      General: She is not in acute distress.    Appearance: Normal appearance.  HENT:     Head: Normocephalic and atraumatic.  Eyes:     Conjunctiva/sclera: Conjunctivae normal.  Cardiovascular:     Rate and Rhythm: Normal rate and regular rhythm.     Heart sounds: Normal heart sounds.  Pulmonary:     Effort: Pulmonary effort is normal. No respiratory distress.     Breath sounds: Normal breath sounds. No wheezing.  Abdominal:     General: There is no distension.     Palpations: Abdomen is soft.     Tenderness: There is no abdominal tenderness. There is no guarding or rebound.  Musculoskeletal:     Cervical back: Neck supple.     Right lower leg: No edema.     Left lower leg: No edema.  Lymphadenopathy:     Cervical: No cervical adenopathy.  Skin:    General: Skin is warm and dry.     Findings: No rash.  Neurological:     Mental Status: She is alert. Mental status is at baseline.  Psychiatric:        Mood and Affect: Mood normal.  Behavior: Behavior normal.        Lab Results  Component Value Date   WBC 6.2 11/18/2020   HGB 13.8 11/18/2020   HCT 41.2 11/18/2020   PLT 291.0 11/18/2020   GLUCOSE 93 11/18/2020   CHOL 142 07/27/2021   TRIG 114 07/27/2021   HDL 59 07/27/2021   LDLCALC 63 07/27/2021   ALT 19 07/27/2021   AST 25 11/18/2020   NA 139 11/18/2020   K 4.1 11/18/2020   CL 101 11/18/2020   CREATININE 0.78 11/18/2020   BUN 19 11/18/2020   CO2 30 11/18/2020   TSH 1.94 05/07/2019   HGBA1C 5.8 11/18/2020     Assessment & Plan:    See Problem List for Assessment and Plan of chronic medical problems.

## 2022-07-31 NOTE — Patient Instructions (Addendum)
      Blood work was ordered.   The lab is on the first floor.    Medications changes include :       A referral was ordered and someone will call you to schedule an appointment.     Return in about 1 year (around 08/01/2023) for follow up.

## 2022-08-01 ENCOUNTER — Encounter: Payer: Self-pay | Admitting: Internal Medicine

## 2022-08-01 ENCOUNTER — Ambulatory Visit (INDEPENDENT_AMBULATORY_CARE_PROVIDER_SITE_OTHER): Payer: Medicare Other

## 2022-08-01 ENCOUNTER — Ambulatory Visit (INDEPENDENT_AMBULATORY_CARE_PROVIDER_SITE_OTHER): Payer: Medicare Other | Admitting: Internal Medicine

## 2022-08-01 VITALS — BP 126/80 | HR 70 | Temp 98.2°F | Ht 65.0 in | Wt 159.0 lb

## 2022-08-01 DIAGNOSIS — J040 Acute laryngitis: Secondary | ICD-10-CM | POA: Diagnosis not present

## 2022-08-01 DIAGNOSIS — R053 Chronic cough: Secondary | ICD-10-CM

## 2022-08-01 DIAGNOSIS — E785 Hyperlipidemia, unspecified: Secondary | ICD-10-CM

## 2022-08-01 DIAGNOSIS — E559 Vitamin D deficiency, unspecified: Secondary | ICD-10-CM

## 2022-08-01 DIAGNOSIS — R7303 Prediabetes: Secondary | ICD-10-CM

## 2022-08-01 DIAGNOSIS — F4323 Adjustment disorder with mixed anxiety and depressed mood: Secondary | ICD-10-CM | POA: Diagnosis not present

## 2022-08-01 DIAGNOSIS — K219 Gastro-esophageal reflux disease without esophagitis: Secondary | ICD-10-CM

## 2022-08-01 DIAGNOSIS — R062 Wheezing: Secondary | ICD-10-CM | POA: Diagnosis not present

## 2022-08-01 DIAGNOSIS — R059 Cough, unspecified: Secondary | ICD-10-CM | POA: Diagnosis not present

## 2022-08-01 LAB — COMPREHENSIVE METABOLIC PANEL
ALT: 15 U/L (ref 0–35)
AST: 17 U/L (ref 0–37)
Albumin: 3.9 g/dL (ref 3.5–5.2)
Alkaline Phosphatase: 72 U/L (ref 39–117)
BUN: 19 mg/dL (ref 6–23)
CO2: 29 mEq/L (ref 19–32)
Calcium: 9.5 mg/dL (ref 8.4–10.5)
Chloride: 105 mEq/L (ref 96–112)
Creatinine, Ser: 0.78 mg/dL (ref 0.40–1.20)
GFR: 77.29 mL/min (ref >60.00)
Glucose, Bld: 97 mg/dL (ref 70–99)
Potassium: 3.8 mEq/L (ref 3.5–5.1)
Sodium: 143 mEq/L (ref 135–145)
Total Bilirubin: 0.5 mg/dL (ref 0.2–1.2)
Total Protein: 6.7 g/dL (ref 6.0–8.3)

## 2022-08-01 LAB — CBC WITH DIFFERENTIAL/PLATELET
Basophils Absolute: 0.1 10*3/uL (ref 0.0–0.1)
Basophils Relative: 0.9 % (ref 0.0–3.0)
Eosinophils Absolute: 0.2 10*3/uL (ref 0.0–0.7)
Eosinophils Relative: 1.8 % (ref 0.0–5.0)
HCT: 41.3 % (ref 36.0–46.0)
Hemoglobin: 14.3 g/dL (ref 12.0–15.0)
Lymphocytes Relative: 10.5 % — ABNORMAL LOW (ref 12.0–46.0)
Lymphs Abs: 1.1 10*3/uL (ref 0.7–4.0)
MCHC: 34.6 g/dL (ref 30.0–36.0)
MCV: 87.3 fl (ref 78.0–100.0)
Monocytes Absolute: 0.5 10*3/uL (ref 0.1–1.0)
Monocytes Relative: 5 % (ref 3.0–12.0)
Neutro Abs: 8.6 10*3/uL — ABNORMAL HIGH (ref 1.4–7.7)
Neutrophils Relative %: 81.8 % — ABNORMAL HIGH (ref 43.0–77.0)
Platelets: 328 10*3/uL (ref 150.0–400.0)
RBC: 4.74 Mil/uL (ref 3.87–5.11)
RDW: 13.8 % (ref 11.5–15.5)
WBC: 10.5 10*3/uL (ref 4.0–10.5)

## 2022-08-01 LAB — LIPID PANEL
Cholesterol: 152 mg/dL (ref 0–200)
HDL: 63.7 mg/dL (ref >39.00)
LDL Cholesterol: 71 mg/dL (ref 0–99)
NonHDL: 88.66
Total CHOL/HDL Ratio: 2
Triglycerides: 88 mg/dL (ref 0.0–149.0)
VLDL: 17.6 mg/dL (ref 0.0–40.0)

## 2022-08-01 LAB — HEMOGLOBIN A1C: Hgb A1c MFr Bld: 5.7 % (ref 4.6–6.5)

## 2022-08-01 MED ORDER — FAMOTIDINE 40 MG PO TABS: 40.0000 mg | ORAL_TABLET | Freq: Every day | ORAL | 3 refills | Status: DC

## 2022-08-01 MED ORDER — ATORVASTATIN CALCIUM 20 MG PO TABS: 20.0000 mg | ORAL_TABLET | Freq: Every day | ORAL | 3 refills | Status: DC

## 2022-08-01 NOTE — Assessment & Plan Note (Signed)
Chronic Check a1c Low sugar / carb diet Stressed regular exercise  

## 2022-08-01 NOTE — Assessment & Plan Note (Signed)
Chronic Taking otc vitamin D daily Check vitamin D level  

## 2022-08-01 NOTE — Assessment & Plan Note (Signed)
Chronic Check lipid panel, CMP, CBC Continue atorvastatin 20 mg daily Regular exercise and healthy diet encouraged

## 2022-08-01 NOTE — Assessment & Plan Note (Addendum)
Chronic Controlled, stable Continue fluoxetine 20 mg daily Just not sure if she still needs this medication or not-discussed that she could try taking it every other day for a while to see how she felt

## 2022-08-01 NOTE — Assessment & Plan Note (Addendum)
Chronic GERD not controlled Feels reflux nightly Continue omeprazole 20 mg daily - still requires this daily Add pepcid 40 mg at night Stressed that if GERD is not controlled the neck step would be to increase the omeprazole to 40 mg daily Cough, laryngitis and wheeze are likely related to reflux

## 2022-08-01 NOTE — Assessment & Plan Note (Addendum)
Chronic Saw ENT - exam normal Not much laryngitis at this time, but still has it on occasion Has cough at night and wheezing Feels reflux daily, which may be the cause-will start Pepcid 40 mg nightly in addition to her omeprazole 20 mg daily

## 2022-08-02 LAB — VITAMIN D 25 HYDROXY (VIT D DEFICIENCY, FRACTURES): VITD: 44.29 ng/mL (ref 30.00–100.00)

## 2022-08-02 LAB — TSH: TSH: 2.07 u[IU]/mL (ref 0.35–5.50)

## 2022-08-17 ENCOUNTER — Telehealth: Payer: Medicare Other

## 2022-08-31 DIAGNOSIS — H5203 Hypermetropia, bilateral: Secondary | ICD-10-CM | POA: Diagnosis not present

## 2022-08-31 DIAGNOSIS — H2513 Age-related nuclear cataract, bilateral: Secondary | ICD-10-CM | POA: Diagnosis not present

## 2022-10-07 ENCOUNTER — Other Ambulatory Visit: Payer: Self-pay | Admitting: Internal Medicine

## 2022-10-17 ENCOUNTER — Other Ambulatory Visit: Payer: Self-pay | Admitting: Internal Medicine

## 2022-10-28 ENCOUNTER — Ambulatory Visit: Payer: Medicare Other

## 2022-10-28 VITALS — Ht 65.0 in | Wt 159.0 lb

## 2022-10-28 DIAGNOSIS — Z0000 Encounter for general adult medical examination without abnormal findings: Secondary | ICD-10-CM

## 2022-10-28 NOTE — Progress Notes (Signed)
Subjective:   Victoria Stone is a 72 y.o. female who presents for Medicare Annual (Subsequent) preventive examination.  Visit Complete: Virtual  I connected with  Victoria Stone on 10/28/22 by a audio enabled telemedicine application and verified that I am speaking with the correct person using two identifiers.  Patient Location: Home  Provider Location: Home Office  I discussed the limitations of evaluation and management by telemedicine. The patient expressed understanding and agreed to proceed.  Patient Medicare AWV questionnaire was completed by the patient on 10/1522; I have confirmed that all information answered by patient is correct and no changes since this date.  Review of Systems    Vital Signs: Unable to obtain new vitals due to this being a telehealth visit.  Cardiac Risk Factors include: advanced age (>28men, >76 women)     Objective:    Today's Vitals   10/28/22 1040  Weight: 159 lb (72.1 kg)  Height: 5\' 5"  (1.651 m)   Body mass index is 26.46 kg/m.     10/28/2022   10:47 AM 10/15/2021   10:02 AM 09/24/2020   10:39 AM 09/11/2019   10:04 AM 12/31/2018   11:28 AM 12/24/2018   10:36 AM 09/24/2015    8:02 AM  Advanced Directives  Does Patient Have a Medical Advance Directive? Yes Yes Yes Yes Yes Yes Yes  Type of Estate agent of [place];Living will Healthcare Power of [place];Living will Healthcare Power of [place];Living will Healthcare Power of [place];Living will Healthcare Power of [place];Living will Healthcare Power of [place];Out of facility DNR (pink MOST or yellow form)   Does patient want to make changes to medical advance directive?   No - Patient declined No - Patient declined No - Patient declined    Copy of Healthcare Power of Attorney in Chart? No - copy requested No - copy requested No - copy requested No - copy requested       Current Medications (verified) Outpatient Encounter Medications as of 10/28/2022  Medication Sig    atorvastatin (LIPITOR) 20 MG tablet Take 1 tablet (20 mg total) by mouth daily.   Calcium Carbonate (CALCIUM 600 PO) Take by mouth daily.   famotidine (PEPCID) 40 MG tablet Take 1 tablet (40 mg total) by mouth daily.   meloxicam (MOBIC) 7.5 MG tablet TAKE 1 TABLET (7.5 MG TOTAL) BY MOUTH EVERY OTHER DAY.   Multiple Vitamins-Minerals (MULTIVITAMIN) tablet Take 1 tablet by mouth daily.   omeprazole (PRILOSEC) 20 MG capsule TAKE 1 CAPSULE BY MOUTH EVERY DAY   triamcinolone ointment (KENALOG) 0.5 % Apply 1 application topically 2 (two) times daily as needed (foot rash).   VITAMIN D, ERGOCALCIFEROL, PO Take 1,000 Units by mouth.   [DISCONTINUED] FLUoxetine (PROZAC) 20 MG capsule TAKE 1 CAPSULE BY MOUTH EVERY DAY   No facility-administered encounter medications on file as of 10/28/2022.    Allergies (verified) Penicillins and Trazodone and nefazodone   History: Past Medical History:  Diagnosis Date   ALLERGIC RHINITIS    ARTHRITIS    Arthritis of knee 11/20/2008   Carpal tunnel syndrome on right 04/04/2017   Chest pain 07/15/2015   Dyslipidemia    Family history of premature coronary artery disease 07/15/2015   GERD (gastroesophageal reflux disease) 02/08/2016   Obese    Prediabetes 04/06/2017   ULCERATIVE COLITIS    Ulcerative colitis (HCC) 11/20/2008   Past Surgical History:  Procedure Laterality Date   ABDOMINAL HYSTERECTOMY  03/15/1999   BONE SPUR  BREAST SURGERY  03/14/1986   Reduction   CARPAL TUNNEL RELEASE Right 12/31/2018   Procedure: RIGHT CARPAL TUNNEL RELEASE;  Surgeon: Betha Loa, MD;  Location: [facility];  Service: Orthopedics;  Laterality: Right;   COLONOSCOPY  02/14/2011   Dr.Patterson   FOOT SURGERY  90, 04, 09   X'S 3   HAMMER TOE SURGERY     LAPAROSCOPIC GASTRIC BANDING  03/15/2003   TUBAL LIGATION  03/14/1986   Family History  Problem Relation Age of Onset   Hypertension Mother    Lymphoma Mother    Other Mother        PACEMAKER   Heart  disease Mother    Heart attack Mother    Hypertension Father    Heart disease Father    Heart attack Father    Hypertension Sister    Diabetes Sister    Heart attack Sister    Heart disease Sister    Breast cancer Sister    Heart disease Brother    Breast cancer Brother    Hypertension Maternal Grandmother    Hypertension Paternal Grandmother    Hypertension Paternal Grandfather    Colon cancer Neg Hx    Social History   Socioeconomic History   Marital status: Married    Spouse name: Not on file   Number of children: 2   Years of education: Not on file   Highest education level: Associate degree: academic program  Occupational History   Occupation: Teacher, adult education: [employer]  Tobacco Use   Smoking status: Never   Smokeless tobacco: Never   Tobacco comments:    Married, 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[employer]  Vaping Use   Vaping status: Never Used  Substance and Sexual Activity   Alcohol use: Yes    Comment: occasionally   Drug use: No   Sexual activity: Not on file  Other Topics Concern   Not on file  Social History Narrative   RN at [employer]   Married, lives with spouse   Social Determinants of Health   Financial Resource Strain: Low Risk  (10/28/2022)   Overall Financial Resource Strain (CARDIA)    Difficulty of Paying Living Expenses: Not hard at all  Food Insecurity: No Food Insecurity (10/28/2022)   Hunger Vital Sign    Worried About Running Out of Food in the Last Year: Never true    Ran Out of Food in the Last Year: Never true  Transportation Needs: No Transportation Needs (10/28/2022)   PRAPARE - Administrator, Civil Service (Medical): No    Lack of Transportation (Non-Medical): No  Physical Activity: Insufficiently Active (10/28/2022)   Exercise Vital Sign    Days of Exercise per Week: 4 days    Minutes of Exercise per Session: 30 min  Stress: No Stress Concern Present (10/28/2022)   [employer] of Occupational Health -  Occupational Stress Questionnaire    Feeling of Stress : Only a little  Social Connections: Socially Integrated (10/28/2022)   Social Connection and Isolation Panel [NHANES]    Frequency of Communication with Friends and Family: More than three times a week    Frequency of Social Gatherings with Friends and Family: More than three times a week    Attends Religious Services: More than 4 times per year    Active Member of [employer] or Organizations: Yes    Attends Banker Meetings: More than 4 times per year    Marital  Status: Married    Tobacco Counseling Counseling given: Not Answered Tobacco comments: Married, 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[employer]   Clinical Intake:  Pre-visit preparation completed: Yes  Pain : No/denies pain     BMI - recorded: 26.46 Nutritional Status: BMI 25 -29 Overweight Nutritional Risks: None Diabetes: No  How often do you need to have someone help you when you read instructions, pamphlets, or other written materials from your doctor or pharmacy?: 1 - Never  Interpreter Needed?: No  Information entered by :: Theresa Mulligan LPN   Activities of Daily Living    10/28/2022   10:47 AM 10/28/2022   10:46 AM  In your present state of health, do you have any difficulty performing the following activities:  Hearing? 0 0  Vision? 0 0  Difficulty concentrating or making decisions? 0 0  Walking or climbing stairs? 0 0  Dressing or bathing? 0 0  Doing errands, shopping? 0 0  Preparing Food and eating ? N N  Using the Toilet? N N  In the past six months, have you accidently leaked urine? N N  Do you have problems with loss of bowel control? N N  Managing your Medications? N N  Managing your Finances? N N  Housekeeping or managing your Housekeeping? N N    Patient Care Team: Pincus Sanes, MD as PCP - General (Internal Medicine) Quintella Reichert, MD as PCP - Cardiology (Cardiology) Laurey Morale, MD as Consulting Physician  (Cardiology) Zelphia Cairo, MD as Consulting Physician (Obstetrics and Gynecology) Mardella Layman, MD (Gastroenterology) Linna Darner, RD as Dietitian (Family Medicine) Kathyrn Sheriff, [facility] (Inactive) as Pharmacist (Pharmacist) Antony Contras, MD as Consulting Physician (Ophthalmology)  Indicate any recent Medical Services you may have received from other than [facility] providers in the past year (date may be approximate).     Assessment:   This is a routine wellness examination for Madlyne.  Hearing/Vision screen Hearing Screening - Comments:: Denies hearing difficulties   Vision Screening - Comments:: Wears rx glasses - up to date with routine eye exams with  [facility]  Dietary issues and exercise activities discussed:     Goals Addressed               This Visit's Progress     Stay healthy and active. (pt-stated)         Depression Screen    10/28/2022   10:45 AM 08/01/2022    8:57 AM 10/15/2021   10:03 AM 10/15/2021   10:01 AM 09/24/2020   10:40 AM 09/11/2019   10:02 AM 05/08/2018   10:03 AM  PHQ 2/9 Scores  PHQ - 2 Score 0 0 0 0 0 0 0  PHQ- 9 Score  0     0    Fall Risk    10/28/2022   10:47 AM 10/27/2022   12:21 PM 08/01/2022    8:57 AM 10/15/2021   10:03 AM 09/24/2020   10:40 AM  Fall Risk   Falls in the past year? 1 1 0 0 0  Number falls in past yr: 0 0 0 0 0  Injury with Fall? 0 0 0 0 0  Risk for fall due to : No Fall Risks  No Fall Risks  No Fall Risks  Follow up Falls prevention discussed  Falls evaluation completed Falls evaluation completed;Education provided Falls evaluation completed    MEDICARE RISK AT HOME:  Medicare Risk at Home - 10/28/22 1051  Any stairs in or around the home? Yes    If so, are there any without handrails? No    Home free of loose throw rugs in walkways, pet beds, electrical cords, etc? Yes    Adequate lighting in your home to reduce risk of falls? Yes    Life alert? No    Use of a cane, walker or w/c? No     Grab bars in the bathroom? Yes    Shower chair or bench in shower? Yes    Elevated toilet seat or a handicapped toilet? Yes             TIMED UP AND GO:  Was the test performed?  No    Cognitive Function:    09/11/2019   10:06 AM  MMSE - Mini Mental State Exam  Not completed: Refused        10/28/2022   10:48 AM 10/15/2021   10:07 AM 09/11/2019   10:07 AM  6CIT Screen  What Year? 0 points 0 points 0 points  What month? 0 points 0 points 0 points  What time? 0 points 0 points 0 points  Count back from 20 0 points 0 points 0 points  Months in reverse 0 points 0 points 0 points  Repeat phrase 0 points 0 points 0 points  Total Score 0 points 0 points 0 points    Immunizations Immunization History  Administered Date(s) Administered   Fluad Quad(high Dose 65+) 12/14/2018, 12/15/2021   Influenza Split 12/13/2011, 12/13/2014   Influenza, High Dose Seasonal PF 12/08/2017   Influenza-Unspecified 12/13/2015, 12/29/2016   PFIZER Comirnaty(Gray Top)Covid-19 Tri-Sucrose Vaccine 06/13/2020, 12/15/2021   PFIZER(Purple Top)SARS-COV-2 Vaccination 04/03/2019, 04/24/2019, 11/26/2019   Pneumococcal Conjugate-13 10/31/2017   Pneumococcal Polysaccharide-23 05/13/2020   Respiratory Syncytial Virus Vaccine,Recomb Aduvanted(Arexvy) 12/25/2021   Tdap 12/06/2010, 07/23/2020   Zoster Recombinant(Shingrix) 04/05/2017, 06/08/2017   Zoster, Live 10/26/2012    TDAP status: Up to date  Flu Vaccine status: Due, Education has been provided regarding the importance of this vaccine. Advised may receive this vaccine at local pharmacy or Health Dept. Aware to provide a copy of the vaccination record if obtained from local pharmacy or Health Dept. Verbalized acceptance and understanding.  Pneumococcal vaccine status: Up to date  Covid-19 vaccine status: Declined, Education has been provided regarding the importance of this vaccine but patient still declined. Advised may receive this vaccine at local  pharmacy or Health Dept.or vaccine clinic. Aware to provide a copy of the vaccination record if obtained from local pharmacy or Health Dept. Verbalized acceptance and understanding.  Qualifies for Shingles Vaccine? Yes   Zostavax completed Yes   Shingrix Completed?: Yes  Screening Tests Health Maintenance  Topic Date Due   COVID-19 Vaccine (6 - 2023-24 season) 02/09/2022   INFLUENZA VACCINE  10/13/2022   MAMMOGRAM  11/25/2022   DEXA SCAN  05/24/2023   Medicare Annual Wellness (AWV)  10/28/2023   Colonoscopy  07/22/2026   DTaP/Tdap/Td (3 - Td or Tdap) 07/24/2030   Pneumonia Vaccine 51+ Years old  Completed   Hepatitis C Screening  Completed   Zoster Vaccines- Shingrix  Completed   HPV VACCINES  Aged Out    Health Maintenance  Health Maintenance Due  Topic Date Due   COVID-19 Vaccine (6 - 2023-24 season) 02/09/2022   INFLUENZA VACCINE  10/13/2022    Colorectal cancer screening: Type of screening: Colonoscopy. Completed 07/21/21. Repeat every 5 years  Mammogram status: Completed 11/24/21. Repeat every year  Bone Density status: Completed  05/24/18. Results reflect: Bone density results: OSTEOPOROSIS. Repeat every   years.  Lung Cancer Screening: (Low Dose CT Chest recommended if Age 60-80 years, 20 pack-year currently smoking OR have quit w/in 15years.) does not qualify.     Additional Screening:  Hepatitis C Screening: does qualify; Completed 02/02/15  Vision Screening: Recommended annual ophthalmology exams for early detection of glaucoma and other disorders of the eye. Is the patient up to date with their annual eye exam?  Yes  Who is the provider or what is the name of the office in which the patient attends annual eye exams? [facility] If pt is not established with a provider, would they like to be referred to a provider to establish care? No .   Dental Screening: Recommended annual dental exams for proper oral hygiene    Community Resource Referral / Chronic  Care Management:  CRR required this visit?  No   CCM required this visit?  No     Plan:     I have personally reviewed and noted the following in the patient's chart:   Medical and social history Use of alcohol, tobacco or illicit drugs  Current medications and supplements including opioid prescriptions. Patient is not currently taking opioid prescriptions. Functional ability and status Nutritional status Physical activity Advanced directives List of other physicians Hospitalizations, surgeries, and ER visits in previous 12 months Vitals Screenings to include cognitive, depression, and falls Referrals and appointments  In addition, I have reviewed and discussed with patient certain preventive protocols, quality metrics, and best practice recommendations. A written personalized care plan for preventive services as well as general preventive health recommendations were provided to patient.     Tillie Rung, LPN   [id number]   After Visit Summary: (MyChart) Due to this being a telephonic visit, the after visit summary with patients personalized plan was offered to patient via MyChart   Nurse Notes: None

## 2022-10-28 NOTE — Patient Instructions (Addendum)
Victoria Stone , Thank you for taking time to come for your Medicare Wellness Visit. I appreciate your ongoing commitment to your health goals. Please review the following plan we discussed and let me know if I can assist you in the future.   Referrals/Orders/Follow-Ups/Clinician Recommendations:   This is a list of the screening recommended for you and due dates:  Health Maintenance  Topic Date Due   COVID-19 Vaccine (6 - 2023-24 season) 02/09/2022   Flu Shot  10/13/2022   Mammogram  11/25/2022   DEXA scan (bone density measurement)  05/24/2023   Medicare Annual Wellness Visit  10/28/2023   Colon Cancer Screening  07/22/2026   DTaP/Tdap/Td vaccine (3 - Td or Tdap) 07/24/2030   Pneumonia Vaccine  Completed   Hepatitis C Screening  Completed   Zoster (Shingles) Vaccine  Completed   HPV Vaccine  Aged Out    Advanced directives: (Copy Requested) Please bring a copy of your health care power of attorney and living will to the office to be added to your chart at your convenience.  Next Medicare Annual Wellness Visit scheduled for next year: Yes  Preventive Care 5 Years and Older, Female Preventive care refers to lifestyle choices and visits with your health care provider that can promote health and wellness. What does preventive care include? A yearly physical exam. This is also called an annual well check. Dental exams once or twice a year. Routine eye exams. Ask your health care provider how often you should have your eyes checked. Personal lifestyle choices, including: Daily care of your teeth and gums. Regular physical activity. Eating a healthy diet. Avoiding tobacco and drug use. Limiting alcohol use. Practicing safe sex. Taking low-dose aspirin every day. Taking vitamin and mineral supplements as recommended by your health care provider. What happens during an annual well check? The services and screenings done by your health care provider during your annual well check will  depend on your age, overall health, lifestyle risk factors, and family history of disease. Counseling  Your health care provider may ask you questions about your: Alcohol use. Tobacco use. Drug use. Emotional well-being. Home and relationship well-being. Sexual activity. Eating habits. History of falls. Memory and ability to understand (cognition). Work and work Astronomer. Reproductive health. Screening  You may have the following tests or measurements: Height, weight, and BMI. Blood pressure. Lipid and cholesterol levels. These may be checked every 5 years, or more frequently if you are over 56 years old. Skin check. Lung cancer screening. You may have this screening every year starting at age 70 if you have a 30-pack-year history of smoking and currently smoke or have quit within the past 15 years. Fecal occult blood test (FOBT) of the stool. You may have this test every year starting at age 42. Flexible sigmoidoscopy or colonoscopy. You may have a sigmoidoscopy every 5 years or a colonoscopy every 10 years starting at age 48. Hepatitis C blood test. Hepatitis B blood test. Sexually transmitted disease (STD) testing. Diabetes screening. This is done by checking your blood sugar (glucose) after you have not eaten for a while (fasting). You may have this done every 1-3 years. Bone density scan. This is done to screen for osteoporosis. You may have this done starting at age 18. Mammogram. This may be done every 1-2 years. Talk to your health care provider about how often you should have regular mammograms. Talk with your health care provider about your test results, treatment options, and if necessary, the need  for more tests. Vaccines  Your health care provider may recommend certain vaccines, such as: Influenza vaccine. This is recommended every year. Tetanus, diphtheria, and acellular pertussis (Tdap, Td) vaccine. You may need a Td booster every 10 years. Zoster vaccine. You may  need this after age 85. Pneumococcal 13-valent conjugate (PCV13) vaccine. One dose is recommended after age 9. Pneumococcal polysaccharide (PPSV23) vaccine. One dose is recommended after age 65. Talk to your health care provider about which screenings and vaccines you need and how often you need them. This information is not intended to replace advice given to you by your health care provider. Make sure you discuss any questions you have with your health care provider. Document Released: 03/27/2015 Document Revised: 11/18/2015 Document Reviewed: 12/30/2014 Elsevier Interactive Patient Education  2017 [employer].  Fall Prevention in the Home Falls can cause injuries. They can happen to people of all ages. There are many things you can do to make your home safe and to help prevent falls. What can I do on the outside of my home? Regularly fix the edges of walkways and driveways and fix any cracks. Remove anything that might make you trip as you walk through a door, such as a raised step or threshold. Trim any bushes or trees on the path to your home. Use bright outdoor lighting. Clear any walking paths of anything that might make someone trip, such as rocks or tools. Regularly check to see if handrails are loose or broken. Make sure that both sides of any steps have handrails. Any raised decks and porches should have guardrails on the edges. Have any leaves, snow, or ice cleared regularly. Use sand or salt on walking paths during winter. Clean up any spills in your garage right away. This includes oil or grease spills. What can I do in the bathroom? Use night lights. Install grab bars by the toilet and in the tub and shower. Do not use towel bars as grab bars. Use non-skid mats or decals in the tub or shower. If you need to sit down in the shower, use a plastic, non-slip stool. Keep the floor dry. Clean up any water that spills on the floor as soon as it happens. Remove soap buildup in  the tub or shower regularly. Attach bath mats securely with double-sided non-slip rug tape. Do not have throw rugs and other things on the floor that can make you trip. What can I do in the bedroom? Use night lights. Make sure that you have a light by your bed that is easy to reach. Do not use any sheets or blankets that are too big for your bed. They should not hang down onto the floor. Have a firm chair that has side arms. You can use this for support while you get dressed. Do not have throw rugs and other things on the floor that can make you trip. What can I do in the kitchen? Clean up any spills right away. Avoid walking on wet floors. Keep items that you use a lot in easy-to-reach places. If you need to reach something above you, use a strong step stool that has a grab bar. Keep electrical cords out of the way. Do not use floor polish or wax that makes floors slippery. If you must use wax, use non-skid floor wax. Do not have throw rugs and other things on the floor that can make you trip. What can I do with my stairs? Do not leave any items on the stairs.  Make sure that there are handrails on both sides of the stairs and use them. Fix handrails that are broken or loose. Make sure that handrails are as long as the stairways. Check any carpeting to make sure that it is firmly attached to the stairs. Fix any carpet that is loose or worn. Avoid having throw rugs at the top or bottom of the stairs. If you do have throw rugs, attach them to the floor with carpet tape. Make sure that you have a light switch at the top of the stairs and the bottom of the stairs. If you do not have them, ask someone to add them for you. What else can I do to help prevent falls? Wear shoes that: Do not have high heels. Have rubber bottoms. Are comfortable and fit you well. Are closed at the toe. Do not wear sandals. If you use a stepladder: Make sure that it is fully opened. Do not climb a closed  stepladder. Make sure that both sides of the stepladder are locked into place. Ask someone to hold it for you, if possible. Clearly mark and make sure that you can see: Any grab bars or handrails. First and last steps. Where the edge of each step is. Use tools that help you move around (mobility aids) if they are needed. These include: Canes. Walkers. Scooters. Crutches. Turn on the lights when you go into a dark area. Replace any light bulbs as soon as they burn out. Set up your furniture so you have a clear path. Avoid moving your furniture around. If any of your floors are uneven, fix them. If there are any pets around you, be aware of where they are. Review your medicines with your doctor. Some medicines can make you feel dizzy. This can increase your chance of falling. Ask your doctor what other things that you can do to help prevent falls. This information is not intended to replace advice given to you by your health care provider. Make sure you discuss any questions you have with your health care provider. Document Released: 12/25/2008 Document Revised: 08/06/2015 Document Reviewed: 04/04/2014 Elsevier Interactive Patient Education  2017 [employer].

## 2022-10-30 ENCOUNTER — Encounter: Payer: Self-pay | Admitting: Internal Medicine

## 2022-10-31 ENCOUNTER — Other Ambulatory Visit: Payer: Self-pay

## 2022-10-31 ENCOUNTER — Other Ambulatory Visit: Payer: Self-pay | Admitting: Internal Medicine

## 2022-10-31 MED ORDER — TRIAMCINOLONE ACETONIDE 0.5 % EX OINT: 1.0000 | TOPICAL_OINTMENT | Freq: Two times a day (BID) | CUTANEOUS | 1 refills | Status: AC | PRN

## 2022-11-15 DIAGNOSIS — Z23 Encounter for immunization: Secondary | ICD-10-CM | POA: Diagnosis not present

## 2022-11-17 ENCOUNTER — Ambulatory Visit: Payer: Medicare Other | Admitting: Cardiology

## 2022-12-19 DIAGNOSIS — Z23 Encounter for immunization: Secondary | ICD-10-CM | POA: Diagnosis not present

## 2022-12-22 DIAGNOSIS — Z85828 Personal history of other malignant neoplasm of skin: Secondary | ICD-10-CM | POA: Diagnosis not present

## 2022-12-22 DIAGNOSIS — L812 Freckles: Secondary | ICD-10-CM | POA: Diagnosis not present

## 2022-12-22 DIAGNOSIS — D1801 Hemangioma of skin and subcutaneous tissue: Secondary | ICD-10-CM | POA: Diagnosis not present

## 2022-12-22 DIAGNOSIS — L570 Actinic keratosis: Secondary | ICD-10-CM | POA: Diagnosis not present

## 2022-12-22 DIAGNOSIS — L309 Dermatitis, unspecified: Secondary | ICD-10-CM | POA: Diagnosis not present

## 2022-12-22 DIAGNOSIS — L578 Other skin changes due to chronic exposure to nonionizing radiation: Secondary | ICD-10-CM | POA: Diagnosis not present

## 2022-12-22 DIAGNOSIS — L821 Other seborrheic keratosis: Secondary | ICD-10-CM | POA: Diagnosis not present

## 2023-01-02 ENCOUNTER — Other Ambulatory Visit: Payer: Self-pay | Admitting: Internal Medicine

## 2023-01-02 DIAGNOSIS — M1710 Unilateral primary osteoarthritis, unspecified knee: Secondary | ICD-10-CM

## 2023-01-03 ENCOUNTER — Ambulatory Visit: Payer: Medicare Other | Admitting: Physician Assistant

## 2023-02-06 DIAGNOSIS — I2510 Atherosclerotic heart disease of native coronary artery without angina pectoris: Secondary | ICD-10-CM | POA: Insufficient documentation

## 2023-02-06 DIAGNOSIS — E7800 Pure hypercholesterolemia, unspecified: Secondary | ICD-10-CM | POA: Insufficient documentation

## 2023-02-06 NOTE — Progress Notes (Unsigned)
   Cardiology Office Note:    Date:  02/07/2023  ID:  Victoria Stone, DOB 1951-02-21, MRN [id number] PCP: Pincus Sanes, MD  [facility] Providers Cardiologist:  Armanda Magic, MD       Patient Profile:      Coronary artery calcium ETT 08/17/2021: Negative for ischemia CAC score 08/17/2021: 5.9 (50th percentile) Hyperlipidemia Family history of CAD Brother died in late 16s with cardiac arrest; sister with PCI>> CABG at age 70 Ulcerative colitis         History of Present Illness:  Discussed the use of AI scribe software for clinical note transcription with the patient, who gave verbal consent to proceed.  Victoria Stone is a 72 y.o. female who returns for follow-up of coronary calcification/family history of CAD.  She was last seen by Dr. Mayford Knife 07/2021.  Follow-up CAC score was 5.9 (50th percentile).  ETT was negative for ischemia.  She is here alone.  She is doing well without chest discomfort, shortness of breath, syncope, orthopnea, leg edema.  She goes to Pilates once a week.  She plans to start back walking on a daily basis.  She does not smoke.       ROS   See HPI    Studies Reviewed:   EKG Interpretation Date/Time:  Tuesday February 07 2023 10:29:33 EST Ventricular Rate:  68 PR Interval:  160 QRS Duration:  94 QT Interval:  412 QTC Calculation: 438 R Axis:   98  Text Interpretation: Normal sinus rhythm Rightward axis No significant change since last tracing Confirmed by Tereso Newcomer [phone]) on 02/07/2023 10:45:50 AM             Risk Assessment/Calculations:             Physical Exam:   VS:  BP 126/80   Pulse 68   Ht 5\' 5"  (1.651 m)   Wt 166 lb 3.2 oz (75.4 kg)   SpO2 96%   BMI 27.66 kg/m    Wt Readings from Last 3 Encounters:  02/07/23 166 lb 3.2 oz (75.4 kg)  10/28/22 159 lb (72.1 kg)  08/01/22 159 lb (72.1 kg)    Constitutional:      Appearance: Healthy appearance. Not in distress.  Neck:     Vascular: No carotid bruit. JVD normal.   Pulmonary:     Breath sounds: Normal breath sounds. No wheezing. No rales.  Cardiovascular:     Normal rate. Regular rhythm.     Murmurs: There is no murmur.  Edema:    Peripheral edema absent.  Abdominal:     Palpations: Abdomen is soft.        Assessment and Plan:   Assessment & Plan Coronary artery calcification seen on CT scan She has a strong family history of CAD.  Calcium score in June 2023 5.9 (50th percentile).  Exercise treadmill test June 2023 negative for ischemia.  She is doing well without chest discomfort to suggest angina.  Continue Lipitor 20 mg daily.  Plan follow-up again in 1 year. Pure hypercholesterolemia Goal LDL <70.  Labs from May 2024 with LDL 71.  Continue Lipitor 20 mg daily.  If LDL continues to remain above 70, consider increasing Lipitor to 40 mg daily.      Dispo:  Return in about 1 year (around 02/07/2024) for Routine Follow Up, w/ Dr. Mayford Knife.  Signed, Tereso Newcomer, PA-C

## 2023-02-07 ENCOUNTER — Ambulatory Visit: Payer: Medicare Other | Attending: Cardiology | Admitting: Physician Assistant

## 2023-02-07 ENCOUNTER — Encounter: Payer: Self-pay | Admitting: Physician Assistant

## 2023-02-07 VITALS — BP 126/80 | HR 68 | Ht 65.0 in | Wt 166.2 lb

## 2023-02-07 DIAGNOSIS — E7800 Pure hypercholesterolemia, unspecified: Secondary | ICD-10-CM | POA: Diagnosis not present

## 2023-02-07 DIAGNOSIS — I2510 Atherosclerotic heart disease of native coronary artery without angina pectoris: Secondary | ICD-10-CM | POA: Diagnosis not present

## 2023-02-07 NOTE — Assessment & Plan Note (Signed)
She has a strong family history of CAD.  Calcium score in June 2023 5.9 (50th percentile).  Exercise treadmill test June 2023 negative for ischemia.  She is doing well without chest discomfort to suggest angina.  Continue Lipitor 20 mg daily.  Plan follow-up again in 1 year.

## 2023-02-07 NOTE — Patient Instructions (Signed)
Medication Instructions:  Your physician recommends that you continue on your current medications as directed. Please refer to the Current Medication list given to you today.  *If you need a refill on your cardiac medications before your next appointment, please call your pharmacy*   Lab Work: None ordered  If you have labs (blood work) drawn today and your tests are completely normal, you will receive your results only by: MyChart Message (if you have MyChart) OR A paper copy in the mail If you have any lab test that is abnormal or we need to change your treatment, we will call you to review the results.   Testing/Procedures: None ordered   Follow-Up: At [facility], you and your health needs are our priority.  As part of our continuing mission to provide you with exceptional heart care, we have created designated Provider Care Teams.  These Care Teams include your primary Cardiologist (physician) and Advanced Practice Providers (APPs -  Physician Assistants and Nurse Practitioners) who all work together to provide you with the care you need, when you need it.  We recommend signing up for the patient portal called "MyChart".  Sign up information is provided on this After Visit Summary.  MyChart is used to connect with patients for Virtual Visits (Telemedicine).  Patients are able to view lab/test results, encounter notes, upcoming appointments, etc.  Non-urgent messages can be sent to your provider as well.   To learn more about what you can do with MyChart, go to [email or web address].    Your next appointment:   12 month(s)  Provider:   Armanda Magic, MD     Other Instructions

## 2023-02-07 NOTE — Assessment & Plan Note (Signed)
Goal LDL <70.  Labs from May 2024 with LDL 71.  Continue Lipitor 20 mg daily.  If LDL continues to remain above 70, consider increasing Lipitor to 40 mg daily.

## 2023-02-13 DIAGNOSIS — Z124 Encounter for screening for malignant neoplasm of cervix: Secondary | ICD-10-CM | POA: Diagnosis not present

## 2023-02-13 DIAGNOSIS — Z1151 Encounter for screening for human papillomavirus (HPV): Secondary | ICD-10-CM | POA: Diagnosis not present

## 2023-02-13 DIAGNOSIS — R87615 Unsatisfactory cytologic smear of cervix: Secondary | ICD-10-CM | POA: Diagnosis not present

## 2023-02-13 DIAGNOSIS — N958 Other specified menopausal and perimenopausal disorders: Secondary | ICD-10-CM | POA: Diagnosis not present

## 2023-02-13 DIAGNOSIS — M8588 Other specified disorders of bone density and structure, other site: Secondary | ICD-10-CM | POA: Diagnosis not present

## 2023-02-13 DIAGNOSIS — M1990 Unspecified osteoarthritis, unspecified site: Secondary | ICD-10-CM | POA: Diagnosis not present

## 2023-02-13 DIAGNOSIS — Z6828 Body mass index (BMI) 28.0-28.9, adult: Secondary | ICD-10-CM | POA: Diagnosis not present

## 2023-02-13 DIAGNOSIS — Z1272 Encounter for screening for malignant neoplasm of vagina: Secondary | ICD-10-CM | POA: Diagnosis not present

## 2023-02-13 DIAGNOSIS — Z1231 Encounter for screening mammogram for malignant neoplasm of breast: Secondary | ICD-10-CM | POA: Diagnosis not present

## 2023-02-13 LAB — HM MAMMOGRAPHY

## 2023-02-13 LAB — HM DEXA SCAN

## 2023-02-20 DIAGNOSIS — M8588 Other specified disorders of bone density and structure, other site: Secondary | ICD-10-CM | POA: Diagnosis not present

## 2023-02-20 DIAGNOSIS — M8580 Other specified disorders of bone density and structure, unspecified site: Secondary | ICD-10-CM | POA: Diagnosis not present

## 2023-02-27 DIAGNOSIS — D485 Neoplasm of uncertain behavior of skin: Secondary | ICD-10-CM | POA: Diagnosis not present

## 2023-02-27 DIAGNOSIS — B078 Other viral warts: Secondary | ICD-10-CM | POA: Diagnosis not present

## 2023-02-27 DIAGNOSIS — Z85828 Personal history of other malignant neoplasm of skin: Secondary | ICD-10-CM | POA: Diagnosis not present

## 2023-02-27 DIAGNOSIS — K130 Diseases of lips: Secondary | ICD-10-CM | POA: Diagnosis not present

## 2023-04-10 ENCOUNTER — Other Ambulatory Visit: Payer: Self-pay | Admitting: Internal Medicine

## 2023-05-15 ENCOUNTER — Other Ambulatory Visit: Payer: Self-pay | Admitting: Internal Medicine

## 2023-05-24 ENCOUNTER — Encounter: Payer: Self-pay | Admitting: Internal Medicine

## 2023-05-31 ENCOUNTER — Encounter: Payer: Self-pay | Admitting: Internal Medicine

## 2023-05-31 NOTE — Patient Instructions (Addendum)
      Medications changes include :   fluoxetine 20 mg daily, telmisartan 20 mg        Return for follow up in 4-6 weeks .

## 2023-05-31 NOTE — Progress Notes (Unsigned)
      Subjective:    Patient ID: Victoria Stone, female    DOB: 09/13/1950, 73 y.o.   MRN: [id number]     HPI Victoria Stone is here for follow up of her chronic medical problems.  Elevated BP  Medications and allergies reviewed with patient and updated if appropriate.  Current Outpatient Medications on File Prior to Visit  Medication Sig Dispense Refill   atorvastatin (LIPITOR) 20 MG tablet Take 1 tablet (20 mg total) by mouth daily. 90 tablet 3   Calcium Carbonate (CALCIUM 600 PO) Take by mouth daily.     famotidine (PEPCID) 40 MG tablet TAKE 1 TABLET BY MOUTH EVERY DAY 90 tablet 0   meloxicam (MOBIC) 7.5 MG tablet TAKE 1 TABLET (7.5 MG TOTAL) BY MOUTH EVERY OTHER DAY. 45 tablet 0   Multiple Vitamins-Minerals (MULTIVITAMIN) tablet Take 1 tablet by mouth daily.     omeprazole (PRILOSEC) 20 MG capsule TAKE 1 CAPSULE BY MOUTH EVERY DAY 90 capsule 1   triamcinolone ointment (KENALOG) 0.5 % Apply 1 Application topically 2 (two) times daily as needed (foot rash). 30 g 1   VITAMIN D, ERGOCALCIFEROL, PO Take 1,000 Units by mouth.     No current facility-administered medications on file prior to visit.     Review of Systems     Objective:  There were no vitals filed for this visit. BP Readings from Last 3 Encounters:  02/07/23 126/80  08/01/22 126/80  12/22/21 124/88   Wt Readings from Last 3 Encounters:  02/07/23 166 lb 3.2 oz (75.4 kg)  10/28/22 159 lb (72.1 kg)  08/01/22 159 lb (72.1 kg)   There is no height or weight on file to calculate BMI.    Physical Exam     Lab Results  Component Value Date   WBC 10.5 08/01/2022   HGB 14.3 08/01/2022   HCT 41.3 08/01/2022   PLT 328.0 08/01/2022   GLUCOSE 97 08/01/2022   CHOL 152 08/01/2022   TRIG 88.0 08/01/2022   HDL 63.70 08/01/2022   LDLCALC 71 08/01/2022   ALT 15 08/01/2022   AST 17 08/01/2022   NA 143 08/01/2022   K 3.8 08/01/2022   CL 105 08/01/2022   CREATININE 0.78 08/01/2022   BUN 19 08/01/2022   CO2 29  08/01/2022   TSH 2.07 08/01/2022   HGBA1C 5.7 08/01/2022     Assessment & Plan:    See Problem List for Assessment and Plan of chronic medical problems.

## 2023-06-01 ENCOUNTER — Ambulatory Visit (INDEPENDENT_AMBULATORY_CARE_PROVIDER_SITE_OTHER): Admitting: Internal Medicine

## 2023-06-01 VITALS — BP 136/80 | HR 70 | Temp 98.3°F | Ht 65.0 in | Wt 165.0 lb

## 2023-06-01 DIAGNOSIS — F419 Anxiety disorder, unspecified: Secondary | ICD-10-CM

## 2023-06-01 DIAGNOSIS — F32A Depression, unspecified: Secondary | ICD-10-CM | POA: Diagnosis not present

## 2023-06-01 DIAGNOSIS — I10 Essential (primary) hypertension: Secondary | ICD-10-CM | POA: Diagnosis not present

## 2023-06-01 MED ORDER — TELMISARTAN 20 MG PO TABS: 20.0000 mg | ORAL_TABLET | Freq: Every day | ORAL | 5 refills | Status: DC

## 2023-06-01 MED ORDER — FLUOXETINE HCL 20 MG PO CAPS: 20.0000 mg | ORAL_CAPSULE | Freq: Every day | ORAL | 3 refills | Status: AC

## 2023-06-01 NOTE — Assessment & Plan Note (Signed)
 New Has had several recent readings of elevated blood pressure Start telmisartan 20 mg daily Monitor BP at home-discussed goal Continue exercise, low-sodium diet Follow-up in 4-6 weeks to recheck BP and blood work Discussed that we can increase the telmisartan via MyChart if BP is consistently still elevated

## 2023-06-01 NOTE — Assessment & Plan Note (Signed)
 Chronic She has been off the Prozac for a while and has been doing well, but starting to feel some anxiety and depression Restart Prozac 20 mg daily Follow-up in 4-6 weeks

## 2023-06-09 ENCOUNTER — Telehealth: Payer: Self-pay | Admitting: Internal Medicine

## 2023-06-09 DIAGNOSIS — S76311A Strain of muscle, fascia and tendon of the posterior muscle group at thigh level, right thigh, initial encounter: Secondary | ICD-10-CM | POA: Diagnosis not present

## 2023-06-09 NOTE — Telephone Encounter (Signed)
 Patient called on both numbers, cell number fast busy, home number ringing and recording call could not be completed at this time, try call again later. I was calling to ask patient which medication is she referring to. Will route to the office.  Copied from CRM [id number]. Topic: Clinical - Medication Question >> Jun 09, 2023 10:24 AM Saverio Danker wrote: Reason for CRM: Patient would like to be contacted about if she can get a medication prescribed while she is out of town

## 2023-06-12 NOTE — Telephone Encounter (Signed)
 Spoke with patient and she received her dose pack already.

## 2023-06-13 DIAGNOSIS — S39013A Strain of muscle, fascia and tendon of pelvis, initial encounter: Secondary | ICD-10-CM | POA: Diagnosis not present

## 2023-07-05 ENCOUNTER — Ambulatory Visit: Admitting: Internal Medicine

## 2023-07-10 NOTE — Patient Instructions (Addendum)
      Blood work was ordered.       Medications changes include :   None    An Echo was ordered - someone will call you to schedule this.     Return in about 6 months (around 01/10/2024) for follow up.

## 2023-07-10 NOTE — Progress Notes (Unsigned)
      Subjective:    Patient ID: Victoria Stone, female    DOB: 03/03/1951, 73 y.o.   MRN: [id number]     HPI Amparo is here for follow up of her chronic medical problems.  Started on telmisartan  5 weeks ago  also started on fluoxetine  20 mg daily.    Medications and allergies reviewed with patient and updated if appropriate.  Current Outpatient Medications on File Prior to Visit  Medication Sig Dispense Refill   atorvastatin  (LIPITOR) 20 MG tablet Take 1 tablet (20 mg total) by mouth daily. 90 tablet 3   Calcium  Carbonate (CALCIUM  600 PO) Take by mouth daily.     famotidine  (PEPCID ) 40 MG tablet TAKE 1 TABLET BY MOUTH EVERY DAY 90 tablet 0   FLUoxetine  (PROZAC ) 20 MG capsule Take 1 capsule (20 mg total) by mouth daily. 90 capsule 3   meloxicam  (MOBIC ) 7.5 MG tablet TAKE 1 TABLET (7.5 MG TOTAL) BY MOUTH EVERY OTHER DAY. 45 tablet 0   Multiple Vitamins-Minerals (MULTIVITAMIN) tablet Take 1 tablet by mouth daily.     omeprazole  (PRILOSEC) 20 MG capsule TAKE 1 CAPSULE BY MOUTH EVERY DAY 90 capsule 1   telmisartan  (MICARDIS ) 20 MG tablet Take 1 tablet (20 mg total) by mouth daily. 30 tablet 5   triamcinolone  ointment (KENALOG ) 0.5 % Apply 1 Application topically 2 (two) times daily as needed (foot rash). 30 g 1   VITAMIN D , ERGOCALCIFEROL , PO Take 1,000 Units by mouth.     No current facility-administered medications on file prior to visit.     Review of Systems     Objective:  There were no vitals filed for this visit. BP Readings from Last 3 Encounters:  06/01/23 136/80  02/07/23 126/80  08/01/22 126/80   Wt Readings from Last 3 Encounters:  06/01/23 165 lb (74.8 kg)  02/07/23 166 lb 3.2 oz (75.4 kg)  10/28/22 159 lb (72.1 kg)   There is no height or weight on file to calculate BMI.    Physical Exam     Lab Results  Component Value Date   WBC 10.5 08/01/2022   HGB 14.3 08/01/2022   HCT 41.3 08/01/2022   PLT 328.0 08/01/2022   GLUCOSE 97 08/01/2022   CHOL  152 08/01/2022   TRIG 88.0 08/01/2022   HDL 63.70 08/01/2022   LDLCALC 71 08/01/2022   ALT 15 08/01/2022   AST 17 08/01/2022   NA 143 08/01/2022   K 3.8 08/01/2022   CL 105 08/01/2022   CREATININE 0.78 08/01/2022   BUN 19 08/01/2022   CO2 29 08/01/2022   TSH 2.07 08/01/2022   HGBA1C 5.7 08/01/2022     Assessment & Plan:    See Problem List for Assessment and Plan of chronic medical problems.

## 2023-07-11 ENCOUNTER — Encounter: Payer: Self-pay | Admitting: Internal Medicine

## 2023-07-11 ENCOUNTER — Ambulatory Visit (INDEPENDENT_AMBULATORY_CARE_PROVIDER_SITE_OTHER): Admitting: Internal Medicine

## 2023-07-11 VITALS — BP 130/88 | HR 69 | Temp 98.1°F | Ht 65.0 in | Wt 162.0 lb

## 2023-07-11 DIAGNOSIS — R7303 Prediabetes: Secondary | ICD-10-CM | POA: Diagnosis not present

## 2023-07-11 DIAGNOSIS — E7800 Pure hypercholesterolemia, unspecified: Secondary | ICD-10-CM | POA: Diagnosis not present

## 2023-07-11 DIAGNOSIS — F32A Depression, unspecified: Secondary | ICD-10-CM

## 2023-07-11 DIAGNOSIS — I10 Essential (primary) hypertension: Secondary | ICD-10-CM

## 2023-07-11 DIAGNOSIS — R0609 Other forms of dyspnea: Secondary | ICD-10-CM | POA: Insufficient documentation

## 2023-07-11 DIAGNOSIS — R55 Syncope and collapse: Secondary | ICD-10-CM | POA: Insufficient documentation

## 2023-07-11 DIAGNOSIS — F419 Anxiety disorder, unspecified: Secondary | ICD-10-CM

## 2023-07-11 LAB — CBC WITH DIFFERENTIAL/PLATELET
Basophils Absolute: 0.1 10*3/uL (ref 0.0–0.1)
Basophils Relative: 1.3 % (ref 0.0–3.0)
Eosinophils Absolute: 0.4 10*3/uL (ref 0.0–0.7)
Eosinophils Relative: 6.1 % — ABNORMAL HIGH (ref 0.0–5.0)
HCT: 36.2 % (ref 36.0–46.0)
Hemoglobin: 11.7 g/dL — ABNORMAL LOW (ref 12.0–15.0)
Lymphocytes Relative: 23.5 % (ref 12.0–46.0)
Lymphs Abs: 1.4 10*3/uL (ref 0.7–4.0)
MCHC: 32.3 g/dL (ref 30.0–36.0)
MCV: 79.9 fl (ref 78.0–100.0)
Monocytes Absolute: 0.5 10*3/uL (ref 0.1–1.0)
Monocytes Relative: 8.5 % (ref 3.0–12.0)
Neutro Abs: 3.7 10*3/uL (ref 1.4–7.7)
Neutrophils Relative %: 60.6 % (ref 43.0–77.0)
Platelets: 374 10*3/uL (ref 150.0–400.0)
RBC: 4.53 Mil/uL (ref 3.87–5.11)
RDW: 15.4 % (ref 11.5–15.5)
WBC: 6.1 10*3/uL (ref 4.0–10.5)

## 2023-07-11 LAB — COMPREHENSIVE METABOLIC PANEL WITH GFR
ALT: 14 U/L (ref 0–35)
AST: 17 U/L (ref 0–37)
Albumin: 4.1 g/dL (ref 3.5–5.2)
Alkaline Phosphatase: 55 U/L (ref 39–117)
BUN: 19 mg/dL (ref 6–23)
CO2: 30 meq/L (ref 19–32)
Calcium: 9.2 mg/dL (ref 8.4–10.5)
Chloride: 105 meq/L (ref 96–112)
Creatinine, Ser: 0.73 mg/dL (ref 0.40–1.20)
GFR: 83.13 mL/min (ref >60.00)
Glucose, Bld: 103 mg/dL — ABNORMAL HIGH (ref 70–99)
Potassium: 4.2 meq/L (ref 3.5–5.1)
Sodium: 140 meq/L (ref 135–145)
Total Bilirubin: 0.5 mg/dL (ref 0.2–1.2)
Total Protein: 6.9 g/dL (ref 6.0–8.3)

## 2023-07-11 LAB — LIPID PANEL
Cholesterol: 134 mg/dL (ref 0–200)
HDL: 62.1 mg/dL (ref >39.00)
LDL Cholesterol: 55 mg/dL (ref 0–99)
NonHDL: 72.03
Total CHOL/HDL Ratio: 2
Triglycerides: 84 mg/dL (ref 0.0–149.0)
VLDL: 16.8 mg/dL (ref 0.0–40.0)

## 2023-07-11 LAB — HEMOGLOBIN A1C: Hgb A1c MFr Bld: 6 % (ref 4.6–6.5)

## 2023-07-11 NOTE — Assessment & Plan Note (Signed)
 Chronic Lab Results  Component Value Date   HGBA1C 5.7 08/01/2022   Check a1c Low sugar / carb diet Stressed regular exercise

## 2023-07-11 NOTE — Assessment & Plan Note (Addendum)
 Chronic Started telmisartan  20 mg daily about 5 weeks ago Blood pressure improved and no longer has the pressure feeling in her head, but BP has been variable at home and not quite ideally controlled She will continue to monitor and her blood pressure remains variable and not ideally controlled can consider changing telmisartan  to amlodipine 5 mg to see how that works Continue exercise, low-sodium diet CMP

## 2023-07-11 NOTE — Assessment & Plan Note (Signed)
 Chronic We restarted fluoxetine  20 mg daily 5 weeks ago Anxiety and depression improved Continue fluoxetine  20 mg daily

## 2023-07-11 NOTE — Assessment & Plan Note (Signed)
Chronic Check lipid panel, CMP Continue atorvastatin 20 mg daily Regular exercise and healthy diet encouraged  

## 2023-07-11 NOTE — Assessment & Plan Note (Signed)
 New Has noted some dyspnea on exertion that seems out of the normal for her She is exercising regularly She notices it most when walking longer distances Echocardiogram ordered

## 2023-07-11 NOTE — Assessment & Plan Note (Signed)
 Acute Had an episode last week that sounds like presyncope-BP was very low and it is hard to say if that was the cause or at that was part of the symptoms She will let me know if this happens again Continue to monitor BP-will try to get this better controlled and not so variable-may need to try different medication Echocardiogram ordered

## 2023-08-13 ENCOUNTER — Other Ambulatory Visit: Payer: Self-pay | Admitting: Internal Medicine

## 2023-08-17 ENCOUNTER — Ambulatory Visit: Payer: Self-pay | Admitting: Internal Medicine

## 2023-08-17 ENCOUNTER — Ambulatory Visit (HOSPITAL_COMMUNITY)
Admission: RE | Admit: 2023-08-17 | Discharge: 2023-08-17 | Disposition: A | Discharge status: Home / Self Care | Source: Ambulatory Visit | Attending: Internal Medicine | Admitting: Internal Medicine

## 2023-08-17 DIAGNOSIS — R0609 Other forms of dyspnea: Secondary | ICD-10-CM

## 2023-08-17 LAB — ECHOCARDIOGRAM COMPLETE
Area-P 1/2: 2.48 cm2
S' Lateral: 2.49 cm

## 2023-10-16 ENCOUNTER — Encounter: Payer: Self-pay | Admitting: Internal Medicine

## 2023-10-30 ENCOUNTER — Ambulatory Visit

## 2023-11-05 ENCOUNTER — Other Ambulatory Visit: Payer: Self-pay | Admitting: Internal Medicine

## 2023-11-08 ENCOUNTER — Other Ambulatory Visit: Payer: Self-pay | Admitting: Internal Medicine

## 2023-11-10 ENCOUNTER — Ambulatory Visit (INDEPENDENT_AMBULATORY_CARE_PROVIDER_SITE_OTHER)

## 2023-11-10 VITALS — Ht 65.0 in | Wt 150.0 lb

## 2023-11-10 DIAGNOSIS — Z0000 Encounter for general adult medical examination without abnormal findings: Secondary | ICD-10-CM

## 2023-11-10 NOTE — Patient Instructions (Signed)
 Ms. Mccreadie , Thank you for taking time out of your busy schedule to complete your Annual Wellness Visit with me. I enjoyed our conversation and look forward to speaking with you again next year. I, as well as your care team,  appreciate your ongoing commitment to your health goals. Please review the following plan we discussed and let me know if I can assist you in the future. Your Game plan/ To Do List    Referrals: If you haven't heard from the office you've been referred to, please reach out to them at the phone provided.   Follow up Visits: We will see or speak with you next year for your Next Medicare AWV with our clinical staff Have you seen your provider in the last 6 months (3 months if uncontrolled diabetes)? Yes  Clinician Recommendations:  Aim for 30 minutes of exercise or brisk walking, 6-8 glasses of water, and 5 servings of fruits and vegetables each day.       This is a list of the screenings recommended for you:  Health Maintenance  Topic Date Due   COVID-19 Vaccine (6 - 2024-25 season) 11/13/2022   Flu Shot  10/13/2023   Mammogram  02/13/2024   Medicare Annual Wellness Visit  11/09/2024   Colon Cancer Screening  07/22/2026   DEXA scan (bone density measurement)  02/13/2028   DTaP/Tdap/Td vaccine (3 - Td or Tdap) 07/24/2030   Pneumococcal Vaccine for age over 44  Completed   Hepatitis C Screening  Completed   Zoster (Shingles) Vaccine  Completed   HPV Vaccine  Aged Out   Meningitis B Vaccine  Aged Out    Advanced directives: (Copy Requested) Please bring a copy of your health care power of attorney and living will to the office to be added to your chart at your convenience. You can mail to [facility] [address] [place], [place] [address] or email to [email or web address] Advance Care Planning is important because it:  [x]  Makes sure you receive the medical care that is consistent with your values, goals, and preferences  [x]  It provides  guidance to your family and loved ones and reduces their decisional burden about whether or not they are making the right decisions based on your wishes.  Follow the link provided in your after visit summary or read over the paperwork we have mailed to you to help you started getting your Advance Directives in place. If you need assistance in completing these, please reach out to us  so that we can help you!

## 2023-11-10 NOTE — Progress Notes (Signed)
 Subjective:   Victoria Stone is a 73 y.o. who presents for a Medicare Wellness preventive visit.  As a reminder, Annual Wellness Visits don't include a physical exam, and some assessments may be limited, especially if this visit is performed virtually. We may recommend an in-person follow-up visit with your provider if needed.  Visit Complete: Virtual I connected with  Victoria Stone on 11/10/23 by a audio enabled telemedicine application and verified that I am speaking with the correct person using two identifiers.  Patient Location: Home  Provider Location: Office/Clinic  I discussed the limitations of evaluation and management by telemedicine. The patient expressed understanding and agreed to proceed.  Vital Signs: Because this visit was a virtual/telehealth visit, some criteria may be missing or patient reported. Any vitals not documented were not able to be obtained and vitals that have been documented are patient reported.  VideoDeclined- This patient declined Librarian, academic. Therefore the visit was completed with audio only.  Persons Participating in Visit: Patient.  AWV Questionnaire: Yes: Patient Medicare AWV questionnaire was completed by the patient on 11/09/2023; I have confirmed that all information answered by patient is correct and no changes since this date.  Cardiac Risk Factors include: advanced age (>40men, >70 women);dyslipidemia;hypertension     Objective:    Today's Vitals   11/10/23 1453  Weight: 150 lb (68 kg)  Height: 5' 5 (1.651 m)   Body mass index is 24.96 kg/m.     11/10/2023    2:53 PM 10/28/2022   10:47 AM 10/15/2021   10:02 AM 09/24/2020   10:39 AM 09/11/2019   10:04 AM 12/31/2018   11:28 AM 12/24/2018   10:36 AM  Advanced Directives  Does Patient Have a Medical Advance Directive? Yes Yes Yes Yes Yes Yes Yes  Type of Estate agent of [place];Living will Healthcare Power of [place];Living  will Healthcare Power of [place];Living will Healthcare Power of [place];Living will Healthcare Power of [place];Living will Healthcare Power of [place];Living will Healthcare Power of [place];Out of facility DNR (pink MOST or yellow form)  Does patient want to make changes to medical advance directive?    No - Patient declined No - Patient declined No - Patient declined   Copy of Healthcare Power of Attorney in Chart? No - copy requested No - copy requested No - copy requested No - copy requested No - copy requested      Current Medications (verified) Outpatient Encounter Medications as of 11/10/2023  Medication Sig   atorvastatin  (LIPITOR) 20 MG tablet TAKE 1 TABLET BY MOUTH EVERY DAY   Calcium  Carbonate (CALCIUM  600 PO) Take by mouth daily.   famotidine  (PEPCID ) 40 MG tablet TAKE 1 TABLET BY MOUTH EVERY DAY   FLUoxetine  (PROZAC ) 20 MG capsule Take 1 capsule (20 mg total) by mouth daily.   meloxicam  (MOBIC ) 7.5 MG tablet TAKE 1 TABLET (7.5 MG TOTAL) BY MOUTH EVERY OTHER DAY.   Multiple Vitamins-Minerals (MULTIVITAMIN) tablet Take 1 tablet by mouth daily.   omeprazole  (PRILOSEC) 20 MG capsule TAKE 1 CAPSULE BY MOUTH EVERY DAY   telmisartan  (MICARDIS ) 20 MG tablet TAKE 1 TABLET BY MOUTH EVERY DAY   triamcinolone  ointment (KENALOG ) 0.5 % Apply 1 Application topically 2 (two) times daily as needed (foot rash).   VITAMIN D , ERGOCALCIFEROL , PO Take 1,000 Units by mouth.   No facility-administered encounter medications on file as of 11/10/2023.    Allergies (verified) Penicillins and Trazodone  and nefazodone   History: Past Medical  History:  Diagnosis Date   ALLERGIC RHINITIS    ARTHRITIS    Arthritis of knee 11/20/2008   Carpal tunnel syndrome on right 04/04/2017   Chest pain 07/15/2015   Dyslipidemia    Family history of premature coronary artery disease 07/15/2015   GERD (gastroesophageal reflux disease) 02/08/2016   Obese    Prediabetes 04/06/2017   ULCERATIVE COLITIS    Ulcerative  colitis (HCC) 11/20/2008   Past Surgical History:  Procedure Laterality Date   ABDOMINAL HYSTERECTOMY  03/15/1999   BONE SPUR     BREAST SURGERY  03/14/1986   Reduction   CARPAL TUNNEL RELEASE Right 12/31/2018   Procedure: RIGHT CARPAL TUNNEL RELEASE;  Surgeon: Murrell Drivers, MD;  Location: [facility];  Service: Orthopedics;  Laterality: Right;   COLONOSCOPY  02/14/2011   Dr.Patterson   FOOT SURGERY  90, 04, 09   X'S 3   HAMMER TOE SURGERY     LAPAROSCOPIC GASTRIC BANDING  03/15/2003   TUBAL LIGATION  03/14/1986   Family History  Problem Relation Age of Onset   Hypertension Mother    Lymphoma Mother    Other Mother        PACEMAKER   Heart disease Mother    Heart attack Mother    Hypertension Father    Heart disease Father    Heart attack Father    Hypertension Sister    Diabetes Sister    Heart attack Sister    Heart disease Sister    Breast cancer Sister    Heart disease Brother    Breast cancer Brother    Hypertension Maternal Grandmother    Hypertension Paternal Grandmother    Hypertension Paternal Grandfather    Colon cancer Neg Hx    Social History   Socioeconomic History   Marital status: Married    Spouse name: Not on file   Number of children: 2   Years of education: Not on file   Highest education level: Associate degree: academic program  Occupational History   Occupation: Teacher, adult education: [employer]  Tobacco Use   Smoking status: Never   Smokeless tobacco: Never   Tobacco comments:    Married, 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[employer]  Vaping Use   Vaping status: Never Used  Substance and Sexual Activity   Alcohol use: Yes    Alcohol/week: 1.0 standard drink of alcohol    Types: 1 Glasses of wine per week    Comment: occasionally   Drug use: No   Sexual activity: Yes  Other Topics Concern   Not on file  Social History Narrative   RN at [employer]   Married, lives with spouse   Social Drivers of Health   Financial  Resource Strain: Low Risk  (11/10/2023)   Overall Financial Resource Strain (CARDIA)    Difficulty of Paying Living Expenses: Not hard at all  Food Insecurity: No Food Insecurity (11/10/2023)   Hunger Vital Sign    Worried About Running Out of Food in the Last Year: Never true    Ran Out of Food in the Last Year: Never true  Transportation Needs: No Transportation Needs (11/10/2023)   PRAPARE - Administrator, Civil Service (Medical): No    Lack of Transportation (Non-Medical): No  Physical Activity: Sufficiently Active (11/10/2023)   Exercise Vital Sign    Days of Exercise per Week: 5 days    Minutes of Exercise per Session: 60 min  Stress: No  Stress Concern Present (11/10/2023)   [employer] of Occupational Health - Occupational Stress Questionnaire    Feeling of Stress: Not at all  Recent Concern: Stress - Stress Concern Present (10/29/2023)   [employer] of Occupational Health - Occupational Stress Questionnaire    Feeling of Stress: To some extent  Social Connections: Socially Integrated (11/10/2023)   Social Connection and Isolation Panel    Frequency of Communication with Friends and Family: More than three times a week    Frequency of Social Gatherings with Friends and Family: More than three times a week    Attends Religious Services: More than 4 times per year    Active Member of [employer] or Organizations: Yes    Attends Engineer, structural: More than 4 times per year    Marital Status: Married    Tobacco Counseling Counseling given: Not Answered Tobacco comments: Married, 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[employer]    Clinical Intake:  Pre-visit preparation completed: Yes  Pain : No/denies pain     BMI - recorded: 24.96 Nutritional Risks: None Diabetes: No  Lab Results  Component Value Date   HGBA1C 6.0 07/11/2023   HGBA1C 5.7 08/01/2022   HGBA1C 5.8 11/18/2020     How often do you need to have someone help you when you read  instructions, pamphlets, or other written materials from your doctor or pharmacy?: 1 - Never  Interpreter Needed?: No  Information entered by :: Verdie Saba, CMA   Activities of Daily Living     11/10/2023    2:56 PM 11/09/2023   12:34 PM  In your present state of health, do you have any difficulty performing the following activities:  Hearing? 0 0  Vision? 0 0  Difficulty concentrating or making decisions? 0 0  Walking or climbing stairs? 0 0  Dressing or bathing? 0 0  Doing errands, shopping? 0 0  Preparing Food and eating ? N N  Using the Toilet? N N  In the past six months, have you accidently leaked urine? N N  Do you have problems with loss of bowel control? N N  Managing your Medications? N N  Managing your Finances? N N  Housekeeping or managing your Housekeeping? N N    Patient Care Team: Geofm Glade PARAS, MD as PCP - General (Internal Medicine) Shlomo Wilbert SAUNDERS, MD as PCP - Cardiology (Cardiology) Rolan Ezra RAMAN, MD as Consulting Physician (Cardiology) Latisha Medford, MD as Consulting Physician (Obstetrics and Gynecology) Jakie Alm SAUNDERS, MD (Gastroenterology) Wonda Cy BROCKS, RD as Dietitian (Family Medicine) Fate Morna SAILOR, [facility] (Inactive) as Pharmacist (Pharmacist) Charmayne Molly, MD as Consulting Physician (Ophthalmology) Charmayne Molly, MD as Consulting Physician (Ophthalmology)  I have updated your Care Teams any recent Medical Services you may have received from other providers in the past year.     Assessment:   This is a routine wellness examination for Victoria Stone.  Hearing/Vision screen Hearing Screening - Comments:: Denies hearing difficulties   Vision Screening - Comments:: Wears rx glasses - up to date with routine eye exams with Dr Molly Charmayne   Goals Addressed               This Visit's Progress     Patient Stated (pt-stated)        Patient stated she plans to continue exercising - doing an exercise study       Depression  Screen     11/10/2023    2:57 PM 10/28/2022   10:45  AM 08/01/2022    8:57 AM 10/15/2021   10:03 AM 10/15/2021   10:01 AM 09/24/2020   10:40 AM 09/11/2019   10:02 AM  PHQ 2/9 Scores  PHQ - 2 Score 0 0 0 0 0 0 0  PHQ- 9 Score 3  0        Fall Risk     11/10/2023    2:57 PM 11/09/2023   12:34 PM 10/29/2023   10:37 PM 10/28/2022   10:47 AM 10/27/2022   12:21 PM  Fall Risk   Falls in the past year? 0 0 0 1 1  Number falls in past yr: 0  0 0 0  Injury with Fall? 0  0 0 0  Risk for fall due to : No Fall Risks   No Fall Risks   Follow up Falls evaluation completed;Falls prevention discussed   Falls prevention discussed     MEDICARE RISK AT HOME:  Medicare Risk at Home Any stairs in or around the home?: Yes If so, are there any without handrails?: No Home free of loose throw rugs in walkways, pet beds, electrical cords, etc?: Yes Adequate lighting in your home to reduce risk of falls?: Yes Life alert?: No Use of a cane, walker or w/c?: No Grab bars in the bathroom?: Yes Shower chair or bench in shower?: Yes Elevated toilet seat or a handicapped toilet?: Yes  TIMED UP AND GO:  Was the test performed?  No  Cognitive Function: 6CIT completed    09/11/2019   10:06 AM  MMSE - Mini Mental State Exam  Not completed: Refused        11/10/2023    3:02 PM 10/28/2022   10:48 AM 10/15/2021   10:07 AM 09/11/2019   10:07 AM  6CIT Screen  What Year? 0 points 0 points 0 points 0 points  What month? 0 points 0 points 0 points 0 points  What time? 0 points 0 points 0 points 0 points  Count back from 20 0 points 0 points 0 points 0 points  Months in reverse 0 points 0 points 0 points 0 points  Repeat phrase 0 points 0 points 0 points 0 points  Total Score 0 points 0 points 0 points 0 points    Immunizations Immunization History  Administered Date(s) Administered   Fluad Quad(high Dose 65+) 12/14/2018, 12/15/2021   INFLUENZA, HIGH DOSE SEASONAL PF 12/08/2017, 12/19/2022   Influenza Split  12/13/2011, 12/13/2014   Influenza-Unspecified 12/13/2015, 12/29/2016   PFIZER Comirnaty(Gray Top)Covid-19 Tri-Sucrose Vaccine 06/13/2020, 12/15/2021   PFIZER(Purple Top)SARS-COV-2 Vaccination 04/03/2019, 04/24/2019, 11/26/2019   Pneumococcal Conjugate-13 10/31/2017   Pneumococcal Polysaccharide-23 05/13/2020   Respiratory Syncytial Virus Vaccine,Recomb Aduvanted(Arexvy) 12/25/2021   Tdap 12/06/2010, 07/23/2020   Zoster Recombinant(Shingrix) 04/05/2017, 06/08/2017   Zoster, Live 10/26/2012    Screening Tests Health Maintenance  Topic Date Due   COVID-19 Vaccine (6 - 2024-25 season) 11/13/2022   INFLUENZA VACCINE  10/13/2023   MAMMOGRAM  02/13/2024   Medicare Annual Wellness (AWV)  11/09/2024   Colonoscopy  07/22/2026   DEXA SCAN  02/13/2028   DTaP/Tdap/Td (3 - Td or Tdap) 07/24/2030   Pneumococcal Vaccine: 50+ Years  Completed   Hepatitis C Screening  Completed   Zoster Vaccines- Shingrix  Completed   HPV VACCINES  Aged Out   Meningococcal B Vaccine  Aged Out    Health Maintenance  Health Maintenance Due  Topic Date Due   COVID-19 Vaccine (6 - 2024-25 season) 11/13/2022   INFLUENZA VACCINE  10/13/2023  Health Maintenance Items Addressed: 11/10/2023  Additional Screening:  Vision Screening: Recommended annual ophthalmology exams for early detection of glaucoma and other disorders of the eye. Would you like a referral to an eye doctor? No    Dental Screening: Recommended annual dental exams for proper oral hygiene  Community Resource Referral / Chronic Care Management: CRR required this visit?  No   CCM required this visit?  No   Plan:    I have personally reviewed and noted the following in the patient's chart:   Medical and social history Use of alcohol, tobacco or illicit drugs  Current medications and supplements including opioid prescriptions. Patient is not currently taking opioid prescriptions. Functional ability and status Nutritional status Physical  activity Advanced directives List of other physicians Hospitalizations, surgeries, and ER visits in previous 12 months Vitals Screenings to include cognitive, depression, and falls Referrals and appointments  In addition, I have reviewed and discussed with patient certain preventive protocols, quality metrics, and best practice recommendations. A written personalized care plan for preventive services as well as general preventive health recommendations were provided to patient.   Verdie CHRISTELLA Saba, CMA   11/10/2023   After Visit Summary: (MyChart) Due to this being a telephonic visit, the after visit summary with patients personalized plan was offered to patient via MyChart   Notes: Nothing significant to report at this time.

## 2023-11-27 ENCOUNTER — Other Ambulatory Visit (HOSPITAL_COMMUNITY): Payer: Self-pay

## 2023-11-27 ENCOUNTER — Other Ambulatory Visit (HOSPITAL_BASED_OUTPATIENT_CLINIC_OR_DEPARTMENT_OTHER): Payer: Self-pay

## 2023-11-27 DIAGNOSIS — Z23 Encounter for immunization: Secondary | ICD-10-CM | POA: Diagnosis not present

## 2023-11-27 MED ORDER — COVID-19 MRNA VAC-TRIS(PFIZER) 30 MCG/0.3ML IM SUSY
0.3000 mL | PREFILLED_SYRINGE | Freq: Once | INTRAMUSCULAR | 0 refills | Status: AC
  Filled 2023-11-27: qty 0.3, 1d supply, fill #0

## 2023-11-28 ENCOUNTER — Other Ambulatory Visit (HOSPITAL_COMMUNITY): Payer: Self-pay

## 2023-12-03 ENCOUNTER — Other Ambulatory Visit: Payer: Self-pay | Admitting: Internal Medicine

## 2023-12-03 DIAGNOSIS — M1710 Unilateral primary osteoarthritis, unspecified knee: Secondary | ICD-10-CM

## 2023-12-26 ENCOUNTER — Other Ambulatory Visit (HOSPITAL_COMMUNITY): Payer: Self-pay

## 2023-12-26 DIAGNOSIS — Z23 Encounter for immunization: Secondary | ICD-10-CM | POA: Diagnosis not present

## 2023-12-26 MED ORDER — FLUZONE HIGH-DOSE 0.5 ML IM SUSY
0.5000 mL | PREFILLED_SYRINGE | Freq: Once | INTRAMUSCULAR | 0 refills | Status: AC
  Filled 2023-12-26: qty 0.5, 1d supply, fill #0

## 2023-12-26 MED ORDER — COVID-19 MRNA VAC-TRIS(PFIZER) 30 MCG/0.3ML IM SUSY
0.3000 mL | PREFILLED_SYRINGE | Freq: Once | INTRAMUSCULAR | 0 refills | Status: DC
  Filled 2023-12-26: qty 0.3, 1d supply, fill #0

## 2024-01-07 ENCOUNTER — Other Ambulatory Visit: Payer: Self-pay | Admitting: Internal Medicine

## 2024-01-07 DIAGNOSIS — M1710 Unilateral primary osteoarthritis, unspecified knee: Secondary | ICD-10-CM

## 2024-01-08 ENCOUNTER — Ambulatory Visit (INDEPENDENT_AMBULATORY_CARE_PROVIDER_SITE_OTHER): Admitting: Orthopaedic Surgery

## 2024-01-08 ENCOUNTER — Other Ambulatory Visit (INDEPENDENT_AMBULATORY_CARE_PROVIDER_SITE_OTHER): Payer: Self-pay

## 2024-01-08 ENCOUNTER — Other Ambulatory Visit: Payer: Self-pay

## 2024-01-08 ENCOUNTER — Encounter: Payer: Self-pay | Admitting: Orthopaedic Surgery

## 2024-01-08 DIAGNOSIS — M25562 Pain in left knee: Secondary | ICD-10-CM

## 2024-01-08 DIAGNOSIS — M25551 Pain in right hip: Secondary | ICD-10-CM

## 2024-01-08 DIAGNOSIS — M5441 Lumbago with sciatica, right side: Secondary | ICD-10-CM | POA: Diagnosis not present

## 2024-01-08 DIAGNOSIS — G8929 Other chronic pain: Secondary | ICD-10-CM

## 2024-01-08 NOTE — Progress Notes (Signed)
 The patient is a very active and young appearing 73 year old retired engineer, civil (consulting) who comes in with left knee pain for a long time now where it locks on her and she has some limping and some popping with the knee.  She also reports right hip stiffness but denies any groin pain.  She points to mainly the sciatic area of the source of her right hip pain.  This has been coming along slowly and she tries to stay active.  She is a thin individual.  She is not a diabetic and not on blood thinning medications.  On exam her left knee shows patellofemoral crepitation with no effusion with neutral alignment and good range of motion.  There is some medial joint line tenderness but not a true McMurray's to the medial compartment.  Both hips move smoothly and fluidly.  There is no pain to palpation of the trochanteric area of her right hip but there are some pain in the sciatic region of the hip.  2 views of the left knee show patellofemoral arthritis but the medial and lateral joint line compartments are well-maintained.  There is no effusion.  An AP pelvis and lateral the right hip shows normal-appearing hips bilaterally.  Of note the lower aspect of lumbar spine can be seen at L5-S1 there is significant degenerative changes.  I would like to send her for a course of outpatient physical therapy with really designing a home exercise program to work with her right sided sciatica and her left knee pain.  We would then see her back in 6 weeks.  If she still having the sciatic symptoms, a standing AP and lateral of the lumbar spine would be warranted.  She agrees with this treatment plan.

## 2024-01-09 ENCOUNTER — Other Ambulatory Visit: Payer: Self-pay

## 2024-01-09 DIAGNOSIS — G8929 Other chronic pain: Secondary | ICD-10-CM

## 2024-01-10 ENCOUNTER — Ambulatory Visit: Admitting: Internal Medicine

## 2024-01-14 ENCOUNTER — Encounter: Payer: Self-pay | Admitting: Internal Medicine

## 2024-01-14 NOTE — Patient Instructions (Addendum)
      Blood work was ordered.       Medications changes include :   Sonata 5 mg for sleep      Return in about 6 months (around 07/14/2024) for follow up.

## 2024-01-14 NOTE — Progress Notes (Unsigned)
 Subjective:    Patient ID: Victoria Stone, female    DOB: 1950/12/24, 73 y.o.   MRN: [id number]     HPI Dereka is here for follow up of her chronic medical problems.  Still having sleep issues-this is chronic. She does feel tired when she goes to bed around 11:30 but she tosses and turns we can take her 2-3 hours to fall asleep.  She does get up- 4-5 times for bathroom.  Has tried several things over the counter and nothing has helped.     Has been exercising - goes to [employer] and walks daily.     Medications and allergies reviewed with patient and updated if appropriate.  Current Outpatient Medications on File Prior to Visit  Medication Sig Dispense Refill   atorvastatin  (LIPITOR) 20 MG tablet TAKE 1 TABLET BY MOUTH EVERY DAY 90 tablet 3   Calcium  Carbonate (CALCIUM  600 PO) Take by mouth daily.     famotidine  (PEPCID ) 40 MG tablet TAKE 1 TABLET BY MOUTH EVERY DAY 90 tablet 0   FLUoxetine  (PROZAC ) 20 MG capsule Take 1 capsule (20 mg total) by mouth daily. 90 capsule 3   meloxicam  (MOBIC ) 7.5 MG tablet TAKE 1 TABLET (7.5 MG TOTAL) BY MOUTH EVERY OTHER DAY. (Patient taking differently: Take 7.5 mg by mouth daily.) 45 tablet 0   meloxicam  (MOBIC ) 7.5 MG tablet Take 7.5 mg by mouth daily.     Multiple Vitamins-Minerals (MULTIVITAMIN) tablet Take 1 tablet by mouth daily.     omeprazole  (PRILOSEC) 20 MG capsule TAKE 1 CAPSULE BY MOUTH EVERY DAY 90 capsule 1   telmisartan  (MICARDIS ) 20 MG tablet TAKE 1 TABLET BY MOUTH EVERY DAY 90 tablet 1   triamcinolone  ointment (KENALOG ) 0.5 % Apply 1 Application topically 2 (two) times daily as needed (foot rash). 30 g 1   VITAMIN D , ERGOCALCIFEROL , PO Take 1,000 Units by mouth.     No current facility-administered medications on file prior to visit.     Review of Systems  Constitutional:  Negative for fever.  Respiratory:  Negative for cough, shortness of breath and wheezing.   Cardiovascular:  Negative for chest pain, palpitations and  leg swelling.  Genitourinary:  Positive for menstrual problem.  Neurological:  Negative for light-headedness and headaches.  Psychiatric/Behavioral:  Positive for sleep disturbance. Negative for dysphoric mood. The patient is not nervous/anxious.        Objective:   Vitals:   01/15/24 0912  BP: 130/76  Pulse: 66  Temp: 98 F (36.7 C)  SpO2: 96%   BP Readings from Last 3 Encounters:  01/15/24 130/76  07/11/23 130/88  06/01/23 136/80   Wt Readings from Last 3 Encounters:  01/15/24 141 lb (64 kg)  11/10/23 150 lb (68 kg)  10/30/23 162 lb (73.5 kg)   Body mass index is 23.46 kg/m.    Physical Exam Constitutional:      General: She is not in acute distress.    Appearance: Normal appearance.  HENT:     Head: Normocephalic and atraumatic.  Eyes:     Conjunctiva/sclera: Conjunctivae normal.  Cardiovascular:     Rate and Rhythm: Normal rate and regular rhythm.     Heart sounds: Normal heart sounds.  Pulmonary:     Effort: Pulmonary effort is normal. No respiratory distress.     Breath sounds: Normal breath sounds. No wheezing.  Musculoskeletal:     Cervical back: Neck supple.     Right lower leg: No  edema.     Left lower leg: No edema.  Lymphadenopathy:     Cervical: No cervical adenopathy.  Skin:    General: Skin is warm and dry.     Findings: No rash.  Neurological:     Mental Status: She is alert. Mental status is at baseline.  Psychiatric:        Mood and Affect: Mood normal.        Behavior: Behavior normal.        Lab Results  Component Value Date   WBC 6.1 07/11/2023   HGB 11.7 (L) 07/11/2023   HCT 36.2 07/11/2023   PLT 374.0 07/11/2023   GLUCOSE 103 (H) 07/11/2023   CHOL 134 07/11/2023   TRIG 84.0 07/11/2023   HDL 62.10 07/11/2023   LDLCALC 55 07/11/2023   ALT 14 07/11/2023   AST 17 07/11/2023   NA 140 07/11/2023   K 4.2 07/11/2023   CL 105 07/11/2023   CREATININE 0.73 07/11/2023   BUN 19 07/11/2023   CO2 30 07/11/2023   TSH 2.07  08/01/2022   HGBA1C 6.0 07/11/2023     Assessment & Plan:    See Problem List for Assessment and Plan of chronic medical problems.

## 2024-01-15 ENCOUNTER — Encounter: Payer: Self-pay | Admitting: Radiology

## 2024-01-15 ENCOUNTER — Encounter: Payer: Self-pay | Admitting: Internal Medicine

## 2024-01-15 ENCOUNTER — Ambulatory Visit (INDEPENDENT_AMBULATORY_CARE_PROVIDER_SITE_OTHER): Admitting: Internal Medicine

## 2024-01-15 ENCOUNTER — Other Ambulatory Visit: Payer: Self-pay

## 2024-01-15 VITALS — BP 130/76 | HR 66 | Temp 98.0°F | Ht 65.0 in | Wt 141.0 lb

## 2024-01-15 DIAGNOSIS — G479 Sleep disorder, unspecified: Secondary | ICD-10-CM

## 2024-01-15 DIAGNOSIS — I10 Essential (primary) hypertension: Secondary | ICD-10-CM

## 2024-01-15 DIAGNOSIS — F419 Anxiety disorder, unspecified: Secondary | ICD-10-CM | POA: Diagnosis not present

## 2024-01-15 DIAGNOSIS — I2510 Atherosclerotic heart disease of native coronary artery without angina pectoris: Secondary | ICD-10-CM | POA: Diagnosis not present

## 2024-01-15 DIAGNOSIS — K219 Gastro-esophageal reflux disease without esophagitis: Secondary | ICD-10-CM

## 2024-01-15 DIAGNOSIS — L812 Freckles: Secondary | ICD-10-CM | POA: Diagnosis not present

## 2024-01-15 DIAGNOSIS — R7303 Prediabetes: Secondary | ICD-10-CM | POA: Diagnosis not present

## 2024-01-15 DIAGNOSIS — L603 Nail dystrophy: Secondary | ICD-10-CM | POA: Diagnosis not present

## 2024-01-15 DIAGNOSIS — Z85828 Personal history of other malignant neoplasm of skin: Secondary | ICD-10-CM | POA: Diagnosis not present

## 2024-01-15 DIAGNOSIS — M1710 Unilateral primary osteoarthritis, unspecified knee: Secondary | ICD-10-CM

## 2024-01-15 DIAGNOSIS — F32A Depression, unspecified: Secondary | ICD-10-CM

## 2024-01-15 DIAGNOSIS — E7800 Pure hypercholesterolemia, unspecified: Secondary | ICD-10-CM

## 2024-01-15 DIAGNOSIS — L570 Actinic keratosis: Secondary | ICD-10-CM | POA: Diagnosis not present

## 2024-01-15 DIAGNOSIS — L821 Other seborrheic keratosis: Secondary | ICD-10-CM | POA: Diagnosis not present

## 2024-01-15 LAB — LIPID PANEL
Cholesterol: 148 mg/dL (ref 0–200)
HDL: 65.3 mg/dL (ref >39.00)
LDL Cholesterol: 70 mg/dL (ref 0–99)
NonHDL: 83.19
Total CHOL/HDL Ratio: 2
Triglycerides: 66 mg/dL (ref 0.0–149.0)
VLDL: 13.2 mg/dL (ref 0.0–40.0)

## 2024-01-15 LAB — CBC WITH DIFFERENTIAL/PLATELET
Basophils Absolute: 0.1 K/uL (ref 0.0–0.1)
Basophils Relative: 1.1 % (ref 0.0–3.0)
Eosinophils Absolute: 0.1 K/uL (ref 0.0–0.7)
Eosinophils Relative: 1.5 % (ref 0.0–5.0)
HCT: 32.9 % — ABNORMAL LOW (ref 36.0–46.0)
Hemoglobin: 10.7 g/dL — ABNORMAL LOW (ref 12.0–15.0)
Lymphocytes Relative: 16 % (ref 12.0–46.0)
Lymphs Abs: 1.1 K/uL (ref 0.7–4.0)
MCHC: 32.4 g/dL (ref 30.0–36.0)
MCV: 75.1 fl — ABNORMAL LOW (ref 78.0–100.0)
Monocytes Absolute: 0.4 K/uL (ref 0.1–1.0)
Monocytes Relative: 6.3 % (ref 3.0–12.0)
Neutro Abs: 5.3 K/uL (ref 1.4–7.7)
Neutrophils Relative %: 75.1 % (ref 43.0–77.0)
Platelets: 429 K/uL — ABNORMAL HIGH (ref 150.0–400.0)
RBC: 4.38 Mil/uL (ref 3.87–5.11)
RDW: 17.2 % — ABNORMAL HIGH (ref 11.5–15.5)
WBC: 7.1 K/uL (ref 4.0–10.5)

## 2024-01-15 LAB — COMPREHENSIVE METABOLIC PANEL WITH GFR
ALT: 16 U/L (ref 0–35)
AST: 18 U/L (ref 0–37)
Albumin: 4.1 g/dL (ref 3.5–5.2)
Alkaline Phosphatase: 61 U/L (ref 39–117)
BUN: 25 mg/dL — ABNORMAL HIGH (ref 6–23)
CO2: 30 meq/L (ref 19–32)
Calcium: 9.2 mg/dL (ref 8.4–10.5)
Chloride: 103 meq/L (ref 96–112)
Creatinine, Ser: 0.88 mg/dL (ref 0.40–1.20)
GFR: 66.19 mL/min (ref >60.00)
Glucose, Bld: 100 mg/dL — ABNORMAL HIGH (ref 70–99)
Potassium: 3.9 meq/L (ref 3.5–5.1)
Sodium: 139 meq/L (ref 135–145)
Total Bilirubin: 0.6 mg/dL (ref 0.2–1.2)
Total Protein: 7.1 g/dL (ref 6.0–8.3)

## 2024-01-15 LAB — HEMOGLOBIN A1C: Hgb A1c MFr Bld: 6.2 % (ref 4.6–6.5)

## 2024-01-15 MED ORDER — ZALEPLON 5 MG PO CAPS: 5.0000 mg | ORAL_CAPSULE | Freq: Every evening | ORAL | 5 refills | Status: DC | PRN

## 2024-01-15 MED ORDER — ZALEPLON 5 MG PO CAPS: 5.0000 mg | ORAL_CAPSULE | Freq: Every evening | ORAL | 5 refills | Status: AC | PRN

## 2024-01-15 NOTE — Assessment & Plan Note (Signed)
 Chronic Has had sleep issues for a long time Has tried several things over-the-counter without success Has difficulty falling asleep primarily, but also wakes up several times during the night Discussed pros/cons of sleep medication We both feel the benefits of trying something for sleep may outweigh the risks Trial of Sonata 5 mg at bedtime-she will try it for a few nights and if this is not effective let me know-could consider 10 mg or could try Belsomra

## 2024-01-15 NOTE — Assessment & Plan Note (Addendum)
 Chronic Calcification of coronary arteries seen on CT scan Continue atorvastatin  40 mg daily Blood pressure well-controlled LDL at goal Continue healthy diet, regular exercise

## 2024-01-15 NOTE — Assessment & Plan Note (Signed)
 Chronic Controlled Continue fluoxetine  20 mg daily

## 2024-01-15 NOTE — Assessment & Plan Note (Signed)
Chronic Check lipid panel, CMP Continue atorvastatin 20 mg daily Regular exercise and healthy diet encouraged  

## 2024-01-15 NOTE — Assessment & Plan Note (Addendum)
 Chronic GERD controlled Continue omeprazole  20 mg daily, pepcid  40 mg at night GERD diet She has lost weight-since Gherghe is controlled can try slowly tapering off of Pepcid  and taking as needed

## 2024-01-15 NOTE — Assessment & Plan Note (Signed)
 Chronic Controlled, stable Continue meloxicam  7.5 mg daily

## 2024-01-15 NOTE — Addendum Note (Signed)
 Addended by: GEOFM GLADE PARAS on: 01/15/2024 04:14 PM   Modules accepted: Orders

## 2024-01-15 NOTE — Assessment & Plan Note (Signed)
 Chronic Blood pressure controlled Encouraged regular exercise, healthy diet Continue telmisartan  20 mg daily Monitor BP at home CMP, CBC

## 2024-01-15 NOTE — Assessment & Plan Note (Signed)
 Chronic Lab Results  Component Value Date   HGBA1C 6.0 07/11/2023   Check a1c Low sugar / carb diet Stressed regular exercise

## 2024-01-19 ENCOUNTER — Other Ambulatory Visit

## 2024-01-19 ENCOUNTER — Ambulatory Visit: Payer: Self-pay | Admitting: Internal Medicine

## 2024-01-19 DIAGNOSIS — D509 Iron deficiency anemia, unspecified: Secondary | ICD-10-CM

## 2024-01-19 DIAGNOSIS — D649 Anemia, unspecified: Secondary | ICD-10-CM

## 2024-01-19 LAB — CBC WITH DIFFERENTIAL/PLATELET
Basophils Absolute: 0.1 K/uL (ref 0.0–0.1)
Basophils Relative: 0.7 % (ref 0.0–3.0)
Eosinophils Absolute: 0.3 K/uL (ref 0.0–0.7)
Eosinophils Relative: 3.8 % (ref 0.0–5.0)
HCT: 30.3 % — ABNORMAL LOW (ref 36.0–46.0)
Hemoglobin: 9.8 g/dL — ABNORMAL LOW (ref 12.0–15.0)
Lymphocytes Relative: 19.1 % (ref 12.0–46.0)
Lymphs Abs: 1.5 K/uL (ref 0.7–4.0)
MCHC: 32.3 g/dL (ref 30.0–36.0)
MCV: 75.6 fl — ABNORMAL LOW (ref 78.0–100.0)
Monocytes Absolute: 0.6 K/uL (ref 0.1–1.0)
Monocytes Relative: 7.8 % (ref 3.0–12.0)
Neutro Abs: 5.4 K/uL (ref 1.4–7.7)
Neutrophils Relative %: 68.6 % (ref 43.0–77.0)
Platelets: 367 K/uL (ref 150.0–400.0)
RBC: 4.01 Mil/uL (ref 3.87–5.11)
RDW: 16.6 % — ABNORMAL HIGH (ref 11.5–15.5)
WBC: 7.9 K/uL (ref 4.0–10.5)

## 2024-01-19 LAB — IBC PANEL
Iron: 22 ug/dL — ABNORMAL LOW (ref 42–145)
Saturation Ratios: 5.2 % — ABNORMAL LOW (ref 20.0–50.0)
TIBC: 422.8 ug/dL (ref 250.0–450.0)
Transferrin: 302 mg/dL (ref 212.0–360.0)

## 2024-01-19 LAB — FERRITIN: Ferritin: 7.6 ng/mL — ABNORMAL LOW (ref 10.0–291.0)

## 2024-01-19 LAB — VITAMIN B12: Vitamin B-12: >1500 pg/mL — ABNORMAL HIGH (ref 211–911)

## 2024-01-19 LAB — FOLATE: Folate: >23.7 ng/mL (ref >5.9)

## 2024-01-22 ENCOUNTER — Telehealth: Payer: Self-pay | Admitting: Internal Medicine

## 2024-01-22 ENCOUNTER — Other Ambulatory Visit

## 2024-01-22 ENCOUNTER — Ambulatory Visit: Payer: Self-pay | Admitting: Internal Medicine

## 2024-01-22 DIAGNOSIS — D649 Anemia, unspecified: Secondary | ICD-10-CM

## 2024-01-22 LAB — CBC
HCT: 33.2 % — ABNORMAL LOW (ref 36.0–46.0)
Hemoglobin: 10.8 g/dL — ABNORMAL LOW (ref 12.0–15.0)
MCHC: 32.5 g/dL (ref 30.0–36.0)
MCV: 74.6 fl — ABNORMAL LOW (ref 78.0–100.0)
Platelets: 362 K/uL (ref 150.0–400.0)
RBC: 4.45 Mil/uL (ref 3.87–5.11)
RDW: 16.8 % — ABNORMAL HIGH (ref 11.5–15.5)
WBC: 5.5 K/uL (ref 4.0–10.5)

## 2024-01-22 MED ORDER — PANTOPRAZOLE SODIUM 40 MG PO TBEC: 40.0000 mg | DELAYED_RELEASE_TABLET | Freq: Two times a day (BID) | ORAL | 3 refills | Status: DC

## 2024-01-22 NOTE — Telephone Encounter (Signed)
 Please call her.  I sent a prescription for pantoprazole 40 mg twice daily to her pharmacy.  I would like her to stop the omeprazole  and start this.  Ideally take 30 minutes before breakfast and dinner.  She can continue the Pepcid /famotidine .  I think she is already stop the meloxicam  and should not take any other NSAIDs-Advil, ibuprofen, Aleve.  Blood work is ordered-I would like her to come in today or tomorrow at the latest to recheck her blood counts.  Unfortunately even with an urgent GI referral it takes time to get in with GI.  Again if she has any worsening lightheadedness or any chest pain, shortness of breath, blood in the stool or black stool she needs to go to the emergency room.  We can try checking her blood counts a couple of times this week and as long as they do not continue to drop we hopefully can avoid the hospital.

## 2024-01-24 ENCOUNTER — Ambulatory Visit: Admitting: Gastroenterology

## 2024-01-24 ENCOUNTER — Ambulatory Visit: Admitting: Rehabilitative and Restorative Service Providers"

## 2024-01-24 ENCOUNTER — Encounter: Payer: Self-pay | Admitting: Gastroenterology

## 2024-01-24 VITALS — BP 110/72 | HR 77 | Ht 65.0 in | Wt 144.0 lb

## 2024-01-24 DIAGNOSIS — D509 Iron deficiency anemia, unspecified: Secondary | ICD-10-CM | POA: Diagnosis not present

## 2024-01-24 DIAGNOSIS — Z9884 Bariatric surgery status: Secondary | ICD-10-CM

## 2024-01-24 DIAGNOSIS — K219 Gastro-esophageal reflux disease without esophagitis: Secondary | ICD-10-CM

## 2024-01-24 NOTE — Patient Instructions (Signed)
 You have been scheduled for an endoscopy. Please follow written instructions given to you at your visit today.  If you use inhalers (even only as needed), please bring them with you on the day of your procedure.  If you take any of the following medications, they will need to be adjusted prior to your procedure:   DO NOT TAKE 7 DAYS PRIOR TO TEST- Trulicity (dulaglutide) Ozempic, Wegovy (semaglutide) Mounjaro, Zepbound (tirzepatide) Bydureon Bcise (exanatide extended release)  DO NOT TAKE 1 DAY PRIOR TO YOUR TEST Rybelsus (semaglutide) Adlyxin (lixisenatide) Victoza (liraglutide) Byetta (exanatide) ___________________________________________________________________________  Due to recent changes in healthcare laws, you may see the results of your imaging and laboratory studies on MyChart before your provider has had a chance to review them.  We understand that in some cases there may be results that are confusing or concerning to you. Not all laboratory results come back in the same time frame and the provider may be waiting for multiple results in order to interpret others.  Please give us  48 hours in order for your provider to thoroughly review all the results before contacting the office for clarification of your results.   _______________________________________________________  If your blood pressure at your visit was 140/90 or greater, please contact your primary care physician to follow up on this.  _______________________________________________________  If you are age 71 or older, your body mass index should be between 23-30. Your Body mass index is 23.96 kg/m. If this is out of the aforementioned range listed, please consider follow up with your Primary Care Provider.  If you are age 48 or younger, your body mass index should be between 19-25. Your Body mass index is 23.96 kg/m. If this is out of the aformentioned range listed, please consider follow up with your Primary Care  Provider.   ________________________________________________________  The [facility] providers would like to encourage you to use MYCHART to communicate with providers for non-urgent requests or questions.  Due to long hold times on the telephone, sending your provider a message by [facility] may be a faster and more efficient way to get a response.  Please allow 48 business hours for a response.  Please remember that this is for non-urgent requests.  _______________________________________________________  [facility] is using a team-based approach to care.  Your team is made up of your doctor and two to three APPS. Our APPS (Nurse Practitioners and Physician Assistants) work with your physician to ensure care continuity for you. They are fully qualified to address your health concerns and develop a treatment plan. They communicate directly with your gastroenterologist to care for you. Seeing the Advanced Practice Practitioners on your physician's team can help you by facilitating care more promptly, often allowing for earlier appointments, access to diagnostic testing, procedures, and other specialty referrals.  Your provider has ordered Diatherix stool testing for you. You have received a kit from our office today containing all necessary supplies to complete this test. Please carefully read the stool collection instructions provided in the kit before opening the accompanying materials. In addition, be sure there is a label providing your full name and date of birth on the puritan opti-swab tube that is supplied in the kit (if you do not see a label with this information on your test tube, please make us  aware before test collection!). After completing the test, you should secure the purtian tube into the specimen biohazard bag. The [facility] E-Req sheet (including date and time of specimen collection) should be  placed into the outside pocket of the specimen biohazard bag and  returned to the [place] lab (basement floor of [facility]) within 3 days of collection. Please make sure to give the specimen to a staff member at the lab. DO NOT leave the specimen on the counter.   If the specimen date and time (can be found in the upper right boxed portion of the sheet) are not filled out on the E-Req sheet, the test will NOT be performed.     Thank you for trusting me with your gastrointestinal care. Deanna May, FNP-C

## 2024-01-24 NOTE — Progress Notes (Signed)
 Chief Complaint:Iron deficiency anemia  Primary GI Doctor: Dr. Albertus  HPI:  Patient is a 73 year old female patient with past medical history of GERD, anxiety, and iron deficiency anemia, who was referred to me by Geofm Glade PARAS, MD on 01/19/24 for a evaluation of Iron deficiency anemia  .    Seen by cardiology 01/2023, reviewed entire note.  Interval History    Patient presents for evaluation of iron deficiency anemia.  Patient denies any known history. Patient started on oral iron supplement 1 tablet twice daily yesterday. Patient is not vegan, consumes meat. Patient reports in past she has donated blood on multiple occasions, but recently iron level too low.  Denies black tarry stools. Denies hematemesis. She was taking Meloxicam  past 3-4 years on daily basis, recently stopped due to anemia.   Patient has history of GERD and recently switched from omeprazole  once daily to pantoprazole 40 mg twice daily.  No current issues with GERD or dysphagia.   Denies diarrhea or constipation. Denies abdominal pain. Denies nausea. Appetite good.   Social drinks alcohol. Nonsmoker.  She walks several days a week. Notes some fatigue.  Not on blood thinners.  Remote history of possible mild proctitis over 20 years ago.  Never had EGD.  Surgical history: gastric lap band (20 years ago), breast reduction   Patient's family history includes: no esophageal CA, no colon CA  Wt Readings from Last 3 Encounters:  01/24/24 144 lb (65.3 kg)  01/15/24 141 lb (64 kg)  11/10/23 150 lb (68 kg)    Past Medical History:  Diagnosis Date   ALLERGIC RHINITIS    ARTHRITIS    Arthritis of knee 11/20/2008   Carpal tunnel syndrome on right 04/04/2017   Chest pain 07/15/2015   Dyslipidemia    Family history of premature coronary artery disease 07/15/2015   GERD (gastroesophageal reflux disease) 02/08/2016   Obese    Prediabetes 04/06/2017   ULCERATIVE COLITIS    Ulcerative colitis (HCC) 11/20/2008    Past  Surgical History:  Procedure Laterality Date   ABDOMINAL HYSTERECTOMY  03/15/1999   BONE SPUR     BREAST SURGERY  03/14/1986   Reduction   CARPAL TUNNEL RELEASE Right 12/31/2018   Procedure: RIGHT CARPAL TUNNEL RELEASE;  Surgeon: Murrell Drivers, MD;  Location: [facility];  Service: Orthopedics;  Laterality: Right;   COLONOSCOPY  02/14/2011   Dr.Patterson   FOOT SURGERY  90, 04, 09   X'S 3   HAMMER TOE SURGERY     LAPAROSCOPIC GASTRIC BANDING  03/15/2003   TUBAL LIGATION  03/14/1986    Current Outpatient Medications  Medication Sig Dispense Refill   atorvastatin  (LIPITOR) 20 MG tablet TAKE 1 TABLET BY MOUTH EVERY DAY 90 tablet 3   Calcium  Carbonate (CALCIUM  600 PO) Take by mouth daily.     famotidine  (PEPCID ) 40 MG tablet TAKE 1 TABLET BY MOUTH EVERY DAY 90 tablet 0   FLUoxetine  (PROZAC ) 20 MG capsule Take 1 capsule (20 mg total) by mouth daily. 90 capsule 3   meloxicam  (MOBIC ) 7.5 MG tablet TAKE 1 TABLET (7.5 MG TOTAL) BY MOUTH EVERY OTHER DAY. (Patient taking differently: Take 7.5 mg by mouth daily.) 45 tablet 0   Multiple Vitamins-Minerals (MULTIVITAMIN) tablet Take 1 tablet by mouth daily.     pantoprazole (PROTONIX) 40 MG tablet Take 1 tablet (40 mg total) by mouth 2 (two) times daily. 60 tablet 3   telmisartan  (MICARDIS ) 20 MG tablet TAKE 1 TABLET BY MOUTH  EVERY DAY 90 tablet 1   triamcinolone  ointment (KENALOG ) 0.5 % Apply 1 Application topically 2 (two) times daily as needed (foot rash). 30 g 1   VITAMIN D , ERGOCALCIFEROL , PO Take 1,000 Units by mouth.     zaleplon (SONATA) 5 MG capsule Take 1 capsule (5 mg total) by mouth at bedtime as needed for sleep. 30 capsule 5   meloxicam  (MOBIC ) 7.5 MG tablet Take 7.5 mg by mouth daily. (Patient not taking: Reported on 01/24/2024)     No current facility-administered medications for this visit.    Allergies as of 01/24/2024 - Review Complete 01/24/2024  Allergen Reaction Noted   Penicillins Hives and Rash    Trazodone   and nefazodone Other (See Comments) 11/07/2019    Family History  Problem Relation Age of Onset   Hypertension Mother    Lymphoma Mother    Other Mother        PACEMAKER   Heart disease Mother    Heart attack Mother    Hypertension Father    Heart disease Father    Heart attack Father    Hypertension Sister    Diabetes Sister    Heart attack Sister    Heart disease Sister    Breast cancer Sister    Heart disease Brother    Breast cancer Brother    Hypertension Maternal Grandmother    Hypertension Paternal Grandmother    Hypertension Paternal Grandfather    Colon cancer Neg Hx     Review of Systems:    Constitutional: No weight loss, fever, chills, weakness or fatigue HEENT: Eyes: No change in vision               Ears, Nose, Throat:  No change in hearing or congestion Skin: No rash or itching Cardiovascular: No chest pain, chest pressure or palpitations   Respiratory: No SOB or cough Gastrointestinal: See HPI and otherwise negative Genitourinary: No dysuria or change in urinary frequency Neurological: No headache, dizziness or syncope Musculoskeletal: No new muscle or joint pain Hematologic: No bleeding or bruising Psychiatric: No history of depression or anxiety    Physical Exam:  Vital signs: BP 110/72   Pulse 77   Ht 5' 5 (1.651 m)   Wt 144 lb (65.3 kg)   BMI 23.96 kg/m   Constitutional:   Pleasant female appears to be in NAD, Well developed, Well nourished, alert and cooperative Throat: Oral cavity and pharynx without inflammation, swelling or lesion.  Respiratory: Respirations even and unlabored. Lungs clear to auscultation bilaterally.   No wheezes, crackles, or rhonchi.  Cardiovascular: Normal S1, S2. Regular rate and rhythm. No peripheral edema, cyanosis or pallor.  Gastrointestinal:  Soft, nondistended, nontender. No rebound or guarding. Normal bowel sounds. No appreciable masses or hepatomegaly. Rectal:  Not performed.  Msk:  Symmetrical without  gross deformities. Without edema, no deformity or joint abnormality.  Neurologic:  Alert and  oriented x4;  grossly normal neurologically.  Skin:   Dry and intact without significant lesions or rashes.  RELEVANT LABS AND IMAGING: CBC    Latest Ref Rng & Units 01/22/2024   11:22 AM 01/19/2024    2:27 PM 01/15/2024   11:23 AM  CBC  WBC 4.0 - 10.5 K/uL 5.5  7.9  7.1   Hemoglobin 12.0 - 15.0 g/dL 89.1  9.8  89.2   Hematocrit 36.0 - 46.0 % 33.2  30.3  32.9   Platelets 150.0 - 400.0 K/uL 362.0  367.0  429.0  CMP     Latest Ref Rng & Units 01/15/2024   11:23 AM 07/11/2023    9:54 AM 08/01/2022    9:38 AM  CMP  Glucose 70 - 99 mg/dL 899  896  97   BUN 6 - 23 mg/dL 25  19  19    Creatinine 0.40 - 1.20 mg/dL 9.11  9.26  9.21   Sodium 135 - 145 mEq/L 139  140  143   Potassium 3.5 - 5.1 mEq/L 3.9  4.2  3.8   Chloride 96 - 112 mEq/L 103  105  105   CO2 19 - 32 mEq/L 30  30  29    Calcium  8.4 - 10.5 mg/dL 9.2  9.2  9.5   Total Protein 6.0 - 8.3 g/dL 7.1  6.9  6.7   Total Bilirubin 0.2 - 1.2 mg/dL 0.6  0.5  0.5   Alkaline Phos 39 - 117 U/L 61  55  72   AST 0 - 37 U/L 18  17  17    ALT 0 - 35 U/L 16  14  15       Lab Results  Component Value Date   TSH 2.07 08/01/2022   Lab Results  Component Value Date   IRON 22 (L) 01/19/2024   TIBC 422.8 01/19/2024   FERRITIN 7.6 (L) 01/19/2024  B 12 >1500, folate >23.7   6/25 echo-Left ventricular ejection fraction, by estimation, is 60 to 65%.   07/2021 colonoscopy with Dr. Albertus, recall 5 years - One 7 mm polyp in the transverse colon, removed with a cold snare. Resected and retrieved. - One 4 mm polyp at the splenic flexure, removed with a cold snare. Resected and retrieved.  - Diverticulosis in the sigmoid colon.  - Internal hemorrhoids. Path: Surgical [P], colon, splenic flexure and transverse, polyp (2) - SESSILE SERRATED POLYPS (FOCAL BASAL CRYPT GROWTH ABNORMALITY PRESENT), NEGATIVE FOR DYSPLASIA  02/2011 colonoscopy with Dr.  Jakie No polyps or cancers Otherwise normal exam   Assessment: Encounter Diagnoses  Name Primary?   Gastroesophageal reflux disease, unspecified whether esophagitis present Yes   Iron deficiency anemia, unspecified iron deficiency anemia type    History of gastric bypass      73 year old female patient with history of GERD, chronic NSAID use, and Lap-Band (20 years ago) presents with iron deficiency anemia. Hgb 14.3 in [facility] Marchio 2024, dropped as low as 9.8 on 11/7 and back up to 10.8. Iron 22, ferritin 7.6.  Patient recently started on oral iron supplement twice daily.  Patient advised to stop meloxicam .  Patient's PPI therapy was increased to twice daily.  Denies any overt bleeding.  Will go ahead and order H. pylori Diatherix stool test as well as schedule upper GI endoscopy to evaluate for gastritis and/or PUD.  Colonoscopy in Julyssa Kyer 2023 where she had 2 colon polyps removed and internal hemorrhoids.  We discussed pursuing as well however patient would like to hold off unless endoscopy is unremarkable. It is also important to note history of Lap-Band as well as patient donates blood frequently.  Advise she hold off on donating blood for now.  Plan: -do not take any meloxicam ,or any other NSAIDs including Advil, ibuprofen, Aleve.  -do no donate blood -continue oral iron supplement 1 tab twice daily - Order h pylori diatherix stool test -Schedule EGD in [facility] with Dr. Albertus. The risks and benefits of EGD with possible biopsies and esophageal dilation were discussed with the patient who agrees to proceed. -if negative exam would need to  consider repeating colonoscopy   Thank you for the courtesy of this consult. Please call me with any questions or concerns.   Anila Bojarski, FNP-C [facility] 01/24/2024, 11:52 AM  Cc: Geofm Glade PARAS, MD

## 2024-01-26 ENCOUNTER — Encounter: Payer: Self-pay | Admitting: Rehabilitative and Restorative Service Providers"

## 2024-01-26 ENCOUNTER — Ambulatory Visit (INDEPENDENT_AMBULATORY_CARE_PROVIDER_SITE_OTHER): Admitting: Rehabilitative and Restorative Service Providers"

## 2024-01-26 DIAGNOSIS — M25551 Pain in right hip: Secondary | ICD-10-CM

## 2024-01-26 DIAGNOSIS — G8929 Other chronic pain: Secondary | ICD-10-CM | POA: Diagnosis not present

## 2024-01-26 DIAGNOSIS — R600 Localized edema: Secondary | ICD-10-CM | POA: Diagnosis not present

## 2024-01-26 DIAGNOSIS — M25562 Pain in left knee: Secondary | ICD-10-CM

## 2024-01-26 DIAGNOSIS — K219 Gastro-esophageal reflux disease without esophagitis: Secondary | ICD-10-CM | POA: Diagnosis not present

## 2024-01-26 DIAGNOSIS — M6281 Muscle weakness (generalized): Secondary | ICD-10-CM

## 2024-01-26 DIAGNOSIS — R262 Difficulty in walking, not elsewhere classified: Secondary | ICD-10-CM | POA: Diagnosis not present

## 2024-01-26 DIAGNOSIS — D509 Iron deficiency anemia, unspecified: Secondary | ICD-10-CM | POA: Diagnosis not present

## 2024-01-26 NOTE — Therapy (Signed)
 OUTPATIENT PHYSICAL THERAPY LOWER EXTREMITY EVALUATION   Patient Name: Victoria Stone MRN: [id number] DOB:1951/01/24, 73 y.o., female Today's Date: 01/26/2024  END OF SESSION:  PT End of Session - 01/26/24 1705     Visit Number 1    Number of Visits 16    Date for Recertification  03/22/24    Authorization Type MEDICARE    Authorization - Number of Visits 16    Progress Note Due on Visit 10    PT Start Time 1017    PT Stop Time 1100    PT Time Calculation (min) 43 min    Activity Tolerance Patient tolerated treatment well;No increased pain;Patient limited by pain    Behavior During Therapy [facility] for tasks assessed/performed          Past Medical History:  Diagnosis Date   ALLERGIC RHINITIS    ARTHRITIS    Arthritis of knee 11/20/2008   Carpal tunnel syndrome on right 04/04/2017   Chest pain 07/15/2015   Dyslipidemia    Family history of premature coronary artery disease 07/15/2015   GERD (gastroesophageal reflux disease) 02/08/2016   Obese    Prediabetes 04/06/2017   ULCERATIVE COLITIS    Ulcerative colitis (HCC) 11/20/2008   Past Surgical History:  Procedure Laterality Date   ABDOMINAL HYSTERECTOMY  03/15/1999   BONE SPUR     BREAST SURGERY  03/14/1986   Reduction   CARPAL TUNNEL RELEASE Right 12/31/2018   Procedure: RIGHT CARPAL TUNNEL RELEASE;  Surgeon: Murrell Drivers, MD;  Location: [facility];  Service: Orthopedics;  Laterality: Right;   COLONOSCOPY  02/14/2011   Dr.Patterson   FOOT SURGERY  90, 04, 09   X'S 3   HAMMER TOE SURGERY     LAPAROSCOPIC GASTRIC BANDING  03/15/2003   TUBAL LIGATION  03/14/1986   Patient Active Problem List   Diagnosis Date Noted   Pre-syncope 07/11/2023   Dyspnea on exertion 07/11/2023   Hypertension 06/01/2023   Coronary artery calcification seen on CT scan 02/06/2023   Pure hypercholesterolemia 02/06/2023   Sleep difficulties 11/06/2019   Prediabetes 04/06/2017   Bilateral carpal tunnel syndrome 04/04/2017   Family  history of premature CAD 07/20/2016   GERD (gastroesophageal reflux disease) 02/08/2016   Anxiety and depression 11/20/2008   Allergic rhinitis 11/20/2008   Arthritis of knee 11/20/2008    PCP: Glade DOROTHA Hope, MD  REFERRING PROVIDER: Lonni CINDERELLA Poli, MD  REFERRING DIAG:  Diagnosis  [phone] (ICD-10-CM) - Chronic pain of left knee  M54.41,G89.29 (ICD-10-CM) - Chronic right-sided low back pain with right-sided sciatica    THERAPY DIAG:  Difficulty in walking, not elsewhere classified  Localized edema  Muscle weakness (generalized)  Chronic pain of left knee  Pain in right hip  Rationale for Evaluation and Treatment: Rehabilitation  ONSET DATE: Chronic, worsening over the past few weeks  SUBJECTIVE:   SUBJECTIVE STATEMENT: Yasmin's biggest complaint is her left knee.  She also notes some right hip/gluteal pain.  She has trouble sleeping on the right hip/side.  She feels as if her left kneecap is shifting and it aches all the time.  PERTINENT HISTORY: Knee OA, pre-diabetes, HTN  PAIN:  Are you having pain? Yes: NPRS scale: This week right hip 0-2/10, left knee 2-5/10 Pain location: Rt hip and left knee Pain description: Achy, can get sharp and stiff Aggravating factors: Sleeping on the right hip, squatting, stairs Relieving factors: NA  PRECAUTIONS: Other: Need to do a spine screen secondary to referral for radiculopathy  RED FLAGS: None   WEIGHT BEARING RESTRICTIONS: No  FALLS:  Has patient fallen in last 6 months? No  LIVING ENVIRONMENT: Lives with: lives with their family and lives with their spouse Lives in: House/apartment Stairs: Can be challenging Has following equipment at home: None  OCCUPATION: Retired  PLOF: Independent  PATIENT GOALS: Return to exercises, child sitting, church, daily stuff  NEXT MD VISIT: NA  OBJECTIVE:  Note: Objective measures were completed at Evaluation unless otherwise noted.  DIAGNOSTIC FINDINGS: 2  views of the left knee show well-maintained medial and lateral  compartments with neutral alignment.  There is significant patellofemoral  arthritis.   An AP pelvis and lateral right hip shows both hip joint spaces  well-maintained with no cortical irregularities around the hips.  The  lower aspect of the lumbar spine on the AP view of the pelvis shows  significant degenerative disc disease between L5 and S1.   PATIENT SURVEYS:  PSFS: THE PATIENT SPECIFIC FUNCTIONAL SCALE  Place score of 0-10 (0 = unable to perform activity and 10 = able to perform activity at the same level as before injury or problem)  Activity Date: 01/26/2024    Getting on and off the floor 5    2.  Squatting 5    3.  Walking 7    4.  Stairs 5    Total Score 5.5      Total Score = Sum of activity scores/number of activities  Minimally Detectable Change: 3 points (for single activity); 2 points (for average score)  [facility] (nd). The Patient Specific Functional Scale . Retrieved from Skateoasis.com.pt   COGNITION: Overall cognitive status: Within functional limits for tasks assessed     SENSATION: No other complaints of peripheral pain or paresthesias  LOWER EXTREMITY ROM:  Passive ROM Left/Right 01/26/2024 Left/Right   Hip flexion 110/110   Hip extension    Hip abduction    Hip adduction    Hip internal rotation 10/10   Hip external rotation 45/34   Knee flexion 142/143   Knee extension 0/0   Ankle dorsiflexion    Ankle plantarflexion    Ankle inversion    Ankle eversion    Hamstrings 50/50    (Blank rows = not tested)  STRENGTH:  MMT for hip abduction and hand-held dynamometer for knee extension Left/Right 01/26/2024   Hip flexion    Hip extension    Hip abduction 4+/4   Hip adduction    Hip internal rotation    Hip external rotation    Knee flexion    Knee extension in pounds 56.4/55.6   Ankle dorsiflexion     Ankle plantarflexion    Ankle inversion    Ankle eversion     (Blank rows = not tested)  GAIT: Distance walked: 100 feet Assistive device utilized: None Level of assistance: Complete Independence Comments: NA  TREATMENT DATE: 01/26/2024 Clamshell with resistance in side-lying with black Thera-Band 10 x 3 seconds with slow eccentric (lie left and lift right) Supine figure 4 stretch for right leg only 4 x 20 seconds Seated straight leg raises 2 sets of 10 for 3 seconds bilateral  02464: Reviewed imaging; discussed examination findings and day 1 home exercises   PATIENT EDUCATION:  Education details: See above Person educated: Patient Education method: Explanation, Demonstration, Tactile cues, Verbal cues, and Handouts Education comprehension: verbalized understanding, returned demonstration, verbal cues required, tactile cues required, and needs further education  HOME EXERCISE PROGRAM: Access Code: [facility] URL: [email or web address] Date: 01/26/2024 Prepared by: Lamar Ivory  Exercises - Supine Figure 4 Piriformis Stretch  - 2 x daily - 7 x weekly - 1 sets - 5 reps - 20 seconds hold - Clamshell with Resistance  - 2 x daily - 7 x weekly - 2 sets - 10 reps - 3 seconds hold - Seated Straight Leg Raise   - 2 x daily - 7 x weekly - 2-3 sets - 10 reps  ASSESSMENT:  CLINICAL IMPRESSION: Patient is a 73 y.o. female who was seen today for physical therapy evaluation and treatment for  Diagnosis  M25.562,G89.29 (ICD-10-CM) - Chronic pain of left knee  M54.41,G89.29 (ICD-10-CM) - Chronic right-sided low back pain with right-sided sciatica  .  Keyarra's primary concern is her left knee followed by her right hip.  Bilateral quadricep strength is at the very low end of normal and bilateral hip abductors strength is also not quite where it should be.   There were some mild limitations in her right hip external rotation active range of motion, although strength and endurance appear to be more problematic.  Betina was set up on an appropriate home exercise program to address the above listed impairments and meet the below listed goals.  OBJECTIVE IMPAIRMENTS: cardiopulmonary status limiting activity, decreased activity tolerance, decreased endurance, decreased knowledge of condition, difficulty walking, decreased ROM, decreased strength, increased edema, impaired perceived functional ability, and pain.   ACTIVITY LIMITATIONS: carrying, lifting, bending, squatting, sleeping, stairs, and locomotion level  PARTICIPATION LIMITATIONS: interpersonal relationship, driving, community activity, and church  PERSONAL FACTORS: Knee OA, pre-diabetes, HTN are also affecting patient's functional outcome.   REHAB POTENTIAL: Good  CLINICAL DECISION MAKING: Evolving/moderate complexity  EVALUATION COMPLEXITY: Moderate   GOALS: Goals reviewed with patient? Yes  SHORT TERM GOALS: Target date: 02/23/2024 Bobbijo will be independent with her day 1 home exercise program Baseline: Started 01/26/2024 Goal status: INITIAL  2.  Improve right hip external rotation active range of motion to > 40 degrees Baseline: 34 degrees Goal status: INITIAL  3.  Improve bilateral quadriceps strength to at least 65 pounds Baseline: 56.4/55.6 Goal status: INITIAL   LONG TERM GOALS: Target date: 03/22/2024  Improve patient-specific functional score to at least 7.5 Baseline: 5.5 Goal status: INITIAL  2.  Analeya will report left knee and right hip pain consistently 0-3/10 on the visual analog scale Baseline: Left knee currently as high as 5/10 Goal status: INITIAL  3.  Improve bilateral quadriceps strength to at least 70 pounds Baseline: 56.4/55.6 pounds Goal status: INITIAL  4.  Improve bilateral hip abductor strength to at least 5 -/5 MMT Baseline: 4+/4 MMT Goal  status: INITIAL  5.  Jozlyn will be independent with her long-term maintenance home exercise program at discharge Baseline: Started 01/26/2024 Goal status: INITIAL  PLAN:  PT FREQUENCY: 1-2x/week  PT DURATION: 8 weeks  PLANNED INTERVENTIONS: 97110-Therapeutic exercises, 97530- Therapeutic  activity, [id number]- Neuromuscular re-education, [id number]- Self Care, 02859- Manual therapy, [id number]- Gait training, [phone]- Vasopneumatic device, [phone] (1-2 muscles), 20561 (3+ muscles)- Dry Needling, Patient/Family education, Joint mobilization, and Cryotherapy  PLAN FOR NEXT SESSION: Do a spine screen given radiculopathy diagnosis.  Emphasis on quadriceps and hip abductor strengthening while avoiding overuse.   Myer LELON Ivory, PT, MPT 01/26/2024, 5:20 PM

## 2024-02-01 NOTE — Therapy (Signed)
 OUTPATIENT PHYSICAL THERAPY LOWER EXTREMITY TREATMENT  Patient Name: Victoria Stone MRN: [id number] DOB:17-Mar-1950, 73 y.o., female Today's Date: 02/02/2024  END OF SESSION:  PT End of Session - 02/02/24 1512     Visit Number 2    Number of Visits 16    Date for Recertification  03/22/24    Authorization Type MEDICARE    Authorization - Number of Visits 16    Progress Note Due on Visit 10    PT Start Time 1511    PT Stop Time 1549    PT Time Calculation (min) 38 min    Activity Tolerance Patient tolerated treatment well    Behavior During Therapy [facility] for tasks assessed/performed           Past Medical History:  Diagnosis Date   ALLERGIC RHINITIS    ARTHRITIS    Arthritis of knee 11/20/2008   Carpal tunnel syndrome on right 04/04/2017   Chest pain 07/15/2015   Dyslipidemia    Family history of premature coronary artery disease 07/15/2015   GERD (gastroesophageal reflux disease) 02/08/2016   Obese    Prediabetes 04/06/2017   ULCERATIVE COLITIS    Ulcerative colitis (HCC) 11/20/2008   Past Surgical History:  Procedure Laterality Date   ABDOMINAL HYSTERECTOMY  03/15/1999   BONE SPUR     BREAST SURGERY  03/14/1986   Reduction   CARPAL TUNNEL RELEASE Right 12/31/2018   Procedure: RIGHT CARPAL TUNNEL RELEASE;  Surgeon: Murrell Drivers, Victoria Stone;  Location: [facility];  Service: Orthopedics;  Laterality: Right;   COLONOSCOPY  02/14/2011   Dr.Patterson   FOOT SURGERY  90, 04, 09   X'S 3   HAMMER TOE SURGERY     LAPAROSCOPIC GASTRIC BANDING  03/15/2003   TUBAL LIGATION  03/14/1986   Patient Active Problem List   Diagnosis Date Noted   Pre-syncope 07/11/2023   Dyspnea on exertion 07/11/2023   Hypertension 06/01/2023   Coronary artery calcification seen on CT scan 02/06/2023   Pure hypercholesterolemia 02/06/2023   Sleep difficulties 11/06/2019   Prediabetes 04/06/2017   Bilateral carpal tunnel syndrome 04/04/2017   Family history of premature CAD 07/20/2016   GERD  (gastroesophageal reflux disease) 02/08/2016   Anxiety and depression 11/20/2008   Allergic rhinitis 11/20/2008   Arthritis of knee 11/20/2008    PCP: Victoria DOROTHA Hope, Victoria Stone  REFERRING PROVIDER: Lonni CINDERELLA Poli, Victoria Stone  REFERRING DIAG:  Diagnosis  [phone] (ICD-10-CM) - Chronic pain of left knee  M54.41,G89.29 (ICD-10-CM) - Chronic right-sided low back pain with right-sided sciatica    THERAPY DIAG:  Difficulty in walking, not elsewhere classified  Localized edema  Muscle weakness (generalized)  Chronic pain of left knee  Pain in right hip  Rationale for Evaluation and Treatment: Rehabilitation  ONSET DATE: Chronic, worsening over the past few weeks  SUBJECTIVE:   SUBJECTIVE STATEMENT: Knee continues to feel like its  slipping. PERTINENT HISTORY: Knee OA, pre-diabetes, HTN  PAIN:  Are you having pain? Yes: NPRS scale:  left knee 1/10 Pain location: Rt hip and left knee Pain description: Achy, can get sharp and stiff Aggravating factors: Sleeping on the right hip, squatting, stairs Relieving factors: NA  PRECAUTIONS: Other: Need to do a spine screen secondary to referral for radiculopathy  RED FLAGS: None   WEIGHT BEARING RESTRICTIONS: No  FALLS:  Has patient fallen in last 6 months? No  LIVING ENVIRONMENT: Lives with: lives with their family and lives with their spouse Lives in: House/apartment Stairs: Can be challenging  Has following equipment at home: None  OCCUPATION: Retired  PLOF: Independent  PATIENT GOALS: Return to exercises, child sitting, church, daily stuff  NEXT Victoria Stone VISIT: NA  OBJECTIVE:  Note: Objective measures were completed at Evaluation unless otherwise noted.  DIAGNOSTIC FINDINGS: 2 views of the left knee show well-maintained medial and lateral  compartments with neutral alignment.  There is significant patellofemoral  arthritis.   An AP pelvis and lateral right hip shows both hip joint spaces  well-maintained with  no cortical irregularities around the hips.  The  lower aspect of the lumbar spine on the AP view of the pelvis shows  significant degenerative disc disease between L5 and S1.   PATIENT SURVEYS:  PSFS: THE PATIENT SPECIFIC FUNCTIONAL SCALE  Place score of 0-10 (0 = unable to perform activity and 10 = able to perform activity at the same level as before injury or problem)  Activity Date: 01/26/2024    Getting on and off the floor 5    2.  Squatting 5    3.  Walking 7    4.  Stairs 5    Total Score 5.5      Total Score = Sum of activity scores/number of activities  Minimally Detectable Change: 3 points (for single activity); 2 points (for average score)  [facility] (nd). The Patient Specific Functional Scale . Retrieved from [email or web address].com.pt   COGNITION: Overall cognitive status: Within functional limits for tasks assessed     SENSATION: No other complaints of peripheral pain or paresthesias  AROM SPINE:R mild gibbus , L shoulder slightly higher, L lateral shift, L iliac crest higher Spinal AROM flexion 75%, Lateral flexion L 50%, R 75%, extension 50%  No pain with movement   LOWER EXTREMITY ROM:  Passive ROM Left/Right 01/26/2024 Left/Right   Hip flexion 110/110   Hip extension    Hip abduction    Hip adduction    Hip internal rotation 10/10   Hip external rotation 45/34   Knee flexion 142/143   Knee extension 0/0   Ankle dorsiflexion    Ankle plantarflexion    Ankle inversion    Ankle eversion    Hamstrings 50/50    (Blank rows = not tested)  STRENGTH:  MMT for hip abduction and hand-held dynamometer for knee extension Left/Right 01/26/2024   Hip flexion    Hip extension    Hip abduction 4+/4   Hip adduction    Hip internal rotation    Hip external rotation    Knee flexion    Knee extension in pounds 56.4/55.6   Ankle dorsiflexion    Ankle plantarflexion    Ankle inversion    Ankle  eversion     (Blank rows = not tested)  GAIT: Distance walked: 100 feet Assistive device utilized: None Level of assistance: Complete Independence Comments: NA  TREATMENT DATE: L Knee 02/02/24 Seated straight leg raises 2 sets of 10 for 3 seconds bilateral Clamshell with resistance in side-lying with black Thera-Band  Supine figure 4 stretch for right leg only 4 x 20 seconds SLR 2x20 bil #1 Bridges 3x10 Hip adduction ball squeeze 10x10 sec    01/26/2024 Clamshell with resistance in side-lying with black Thera-Band 10 x 3 seconds with slow eccentric (lie left and lift right) Supine figure 4 stretch for right leg only 4 x 20 seconds Seated straight leg raises 2 sets of 10 for 3 seconds bilateral 02464: Reviewed imaging; discussed examination findings and day 1 home exercises   PATIENT EDUCATION:  Education details: See above Person educated: Patient Education method: Explanation, Demonstration, Tactile cues, Verbal cues, and Handouts Education comprehension: verbalized understanding, returned demonstration, verbal cues required, tactile cues required, and needs further education  HOME EXERCISE PROGRAM: Access Code: [facility] URL: [email or web address] Date: 01/26/2024 Prepared by: Lamar Ivory  Exercises - Supine Figure 4 Piriformis Stretch  - 2 x daily - 7 x weekly - 1 sets - 5 reps - 20 seconds hold - Clamshell with Resistance  - 2 x daily - 7 x weekly - 2 sets - 10 reps - 3 seconds hold - Seated Straight Leg Raise   - 2 x daily - 7 x weekly - 2-3 sets - 10 reps  ASSESSMENT:  CLINICAL IMPRESSION: Pt needed VC for neutral pelvis position.  Demonstrated understanding.    OBJECTIVE IMPAIRMENTS: cardiopulmonary status limiting activity, decreased activity tolerance, decreased endurance, decreased knowledge of condition, difficulty  walking, decreased ROM, decreased strength, increased edema, impaired perceived functional ability, and pain.   ACTIVITY LIMITATIONS: carrying, lifting, bending, squatting, sleeping, stairs, and locomotion level  PARTICIPATION LIMITATIONS: interpersonal relationship, driving, community activity, and church  PERSONAL FACTORS: Knee OA, pre-diabetes, HTN are also affecting patient's functional outcome.   REHAB POTENTIAL: Good  CLINICAL DECISION MAKING: Evolving/moderate complexity  EVALUATION COMPLEXITY: Moderate   GOALS: Goals reviewed with patient? Yes  SHORT TERM GOALS: Target date: 02/23/2024 Rayvin will be independent with her day 1 home exercise program Baseline: Started 01/26/2024 Goal status: INITIAL  2.  Improve right hip external rotation active range of motion to > 40 degrees Baseline: 34 degrees Goal status: INITIAL  3.  Improve bilateral quadriceps strength to at least 65 pounds Baseline: 56.4/55.6 Goal status: INITIAL   LONG TERM GOALS: Target date: 03/22/2024  Improve patient-specific functional score to at least 7.5 Baseline: 5.5 Goal status: INITIAL  2.  Teliyah will report left knee and right hip pain consistently 0-3/10 on the visual analog scale Baseline: Left knee currently as high as 5/10 Goal status: INITIAL  3.  Improve bilateral quadriceps strength to at least 70 pounds Baseline: 56.4/55.6 pounds Goal status: INITIAL  4.  Improve bilateral hip abductor strength to at least 5 -/5 MMT Baseline: 4+/4 MMT Goal status: INITIAL  5.  Laconya will be independent with her long-term maintenance home exercise program at discharge Baseline: Started 01/26/2024 Goal status: INITIAL  PLAN:  PT FREQUENCY: 1-2x/week  PT DURATION: 8 weeks  PLANNED INTERVENTIONS: 97110-Therapeutic exercises, 97530- Therapeutic activity, 97112- Neuromuscular re-education, 97535- Self Care, 02859- Manual therapy, [id number]- Gait training, 97016- Vasopneumatic device, [phone] (1-2  muscles), 20561 (3+ muscles)- Dry Needling, Patient/Family education, Joint mobilization, and Cryotherapy  PLAN FOR NEXT SESSION: Emphasis on quadriceps and hip abductor strengthening while avoiding overuse.   Burnard Meth, PT 02/02/24  3:57 PM

## 2024-02-02 ENCOUNTER — Ambulatory Visit (INDEPENDENT_AMBULATORY_CARE_PROVIDER_SITE_OTHER)

## 2024-02-02 DIAGNOSIS — G8929 Other chronic pain: Secondary | ICD-10-CM | POA: Diagnosis not present

## 2024-02-02 DIAGNOSIS — M25562 Pain in left knee: Secondary | ICD-10-CM

## 2024-02-02 DIAGNOSIS — M25551 Pain in right hip: Secondary | ICD-10-CM

## 2024-02-02 DIAGNOSIS — M6281 Muscle weakness (generalized): Secondary | ICD-10-CM

## 2024-02-02 DIAGNOSIS — R262 Difficulty in walking, not elsewhere classified: Secondary | ICD-10-CM | POA: Diagnosis not present

## 2024-02-02 DIAGNOSIS — R600 Localized edema: Secondary | ICD-10-CM | POA: Diagnosis not present

## 2024-02-07 ENCOUNTER — Encounter: Payer: Self-pay | Admitting: Rehabilitative and Restorative Service Providers"

## 2024-02-07 ENCOUNTER — Ambulatory Visit (INDEPENDENT_AMBULATORY_CARE_PROVIDER_SITE_OTHER): Payer: Self-pay | Admitting: Rehabilitative and Restorative Service Providers"

## 2024-02-07 DIAGNOSIS — M25551 Pain in right hip: Secondary | ICD-10-CM

## 2024-02-07 DIAGNOSIS — G8929 Other chronic pain: Secondary | ICD-10-CM

## 2024-02-07 DIAGNOSIS — M25562 Pain in left knee: Secondary | ICD-10-CM | POA: Diagnosis not present

## 2024-02-07 DIAGNOSIS — M6281 Muscle weakness (generalized): Secondary | ICD-10-CM

## 2024-02-07 DIAGNOSIS — R600 Localized edema: Secondary | ICD-10-CM | POA: Diagnosis not present

## 2024-02-07 DIAGNOSIS — R262 Difficulty in walking, not elsewhere classified: Secondary | ICD-10-CM

## 2024-02-07 NOTE — Therapy (Signed)
 OUTPATIENT PHYSICAL THERAPY LOWER EXTREMITY TREATMENT  Patient Name: Victoria Stone MRN: [id number] DOB:03-Mar-1951, 73 y.o., female Today's Date: 02/07/2024  END OF SESSION:  PT End of Session - 02/07/24 1014     Visit Number 3    Number of Visits 16    Date for Recertification  03/22/24    Authorization Type MEDICARE    Authorization - Number of Visits 16    Progress Note Due on Visit 10    PT Start Time 1014    PT Stop Time 1100    PT Time Calculation (min) 46 min    Activity Tolerance Patient tolerated treatment well;No increased pain    Behavior During Therapy [facility] for tasks assessed/performed            Past Medical History:  Diagnosis Date   ALLERGIC RHINITIS    ARTHRITIS    Arthritis of knee 11/20/2008   Carpal tunnel syndrome on right 04/04/2017   Chest pain 07/15/2015   Dyslipidemia    Family history of premature coronary artery disease 07/15/2015   GERD (gastroesophageal reflux disease) 02/08/2016   Obese    Prediabetes 04/06/2017   ULCERATIVE COLITIS    Ulcerative colitis (HCC) 11/20/2008   Past Surgical History:  Procedure Laterality Date   ABDOMINAL HYSTERECTOMY  03/15/1999   BONE SPUR     BREAST SURGERY  03/14/1986   Reduction   CARPAL TUNNEL RELEASE Right 12/31/2018   Procedure: RIGHT CARPAL TUNNEL RELEASE;  Surgeon: Murrell Drivers, MD;  Location: [facility];  Service: Orthopedics;  Laterality: Right;   COLONOSCOPY  02/14/2011   Dr.Patterson   FOOT SURGERY  90, 04, 09   X'S 3   HAMMER TOE SURGERY     LAPAROSCOPIC GASTRIC BANDING  03/15/2003   TUBAL LIGATION  03/14/1986   Patient Active Problem List   Diagnosis Date Noted   Pre-syncope 07/11/2023   Dyspnea on exertion 07/11/2023   Hypertension 06/01/2023   Coronary artery calcification seen on CT scan 02/06/2023   Pure hypercholesterolemia 02/06/2023   Sleep difficulties 11/06/2019   Prediabetes 04/06/2017   Bilateral carpal tunnel syndrome 04/04/2017   Family history of premature  CAD 07/20/2016   GERD (gastroesophageal reflux disease) 02/08/2016   Anxiety and depression 11/20/2008   Allergic rhinitis 11/20/2008   Arthritis of knee 11/20/2008    PCP: Victoria Victoria Hope, MD  REFERRING PROVIDER: Lonni CINDERELLA Poli, MD  REFERRING DIAG:  Diagnosis  [phone] (ICD-10-CM) - Chronic pain of left knee  M54.41,G89.29 (ICD-10-CM) - Chronic right-sided low back pain with right-sided sciatica    THERAPY DIAG:  Difficulty in walking, not elsewhere classified  Localized edema  Muscle weakness (generalized)  Chronic pain of left knee  Pain in right hip  Rationale for Evaluation and Treatment: Rehabilitation  ONSET DATE: Chronic, worsening over the past few weeks  SUBJECTIVE:   SUBJECTIVE STATEMENT: Victoria Stone notes good early HEP compliance.  She notes low back pain with kitchen activities.  PERTINENT HISTORY: Knee OA, pre-diabetes, HTN  PAIN:  Are you having pain? Yes: NPRS scale:  Left knee 1-4/10 and right hip 0-2/10 this week Pain location: Rt hip and left knee Pain description: Achy, can get sharp and stiff Aggravating factors: Sleeping on the right hip, squatting, stairs Relieving factors: NA  PRECAUTIONS: Other: Need to do a spine screen secondary to referral for radiculopathy  RED FLAGS: None   WEIGHT BEARING RESTRICTIONS: No  FALLS:  Has patient fallen in last 6 months? No  LIVING ENVIRONMENT: Lives  with: lives with their family and lives with their spouse Lives in: House/apartment Stairs: Can be challenging Has following equipment at home: None  OCCUPATION: Retired  PLOF: Independent  PATIENT GOALS: Return to exercises, child sitting, church, daily stuff  NEXT MD VISIT: NA  OBJECTIVE:  Note: Objective measures were completed at Evaluation unless otherwise noted.  DIAGNOSTIC FINDINGS: 2 views of the left knee show well-maintained medial and lateral  compartments with neutral alignment.  There is significant patellofemoral   arthritis.   An AP pelvis and lateral right hip shows both hip joint spaces  well-maintained with no cortical irregularities around the hips.  The  lower aspect of the lumbar spine on the AP view of the pelvis shows  significant degenerative disc disease between L5 and S1.   PATIENT SURVEYS:  PSFS: THE PATIENT SPECIFIC FUNCTIONAL SCALE  Place score of 0-10 (0 = unable to perform activity and 10 = able to perform activity at the same level as before injury or problem)  Activity Date: 01/26/2024    Getting on and off the floor 5    2.  Squatting 5    3.  Walking 7    4.  Stairs 5    Total Score 5.5      Total Score = Sum of activity scores/number of activities  Minimally Detectable Change: 3 points (for single activity); 2 points (for average score)  [facility] (nd). The Patient Specific Functional Scale . Retrieved from [email or web address]   COGNITION: Overall cognitive status: Within functional limits for tasks assessed     SENSATION: No other complaints of peripheral pain or paresthesias  AROM SPINE:R mild gibbus , L shoulder slightly higher, L lateral shift, L iliac crest higher Spinal AROM flexion 75%, Lateral flexion L 50%, R 75%, extension 50%  No pain with movement   LOWER EXTREMITY ROM:  Passive ROM Left/Right 01/26/2024 Left/Right   Hip flexion 110/110   Hip extension    Hip abduction    Hip adduction    Hip internal rotation 10/10   Hip external rotation 45/34   Knee flexion 142/143   Knee extension 0/0   Ankle dorsiflexion    Ankle plantarflexion    Ankle inversion    Ankle eversion    Hamstrings 50/50    (Blank rows = not tested)  STRENGTH:  MMT for hip abduction and hand-held dynamometer for knee extension Left/Right 01/26/2024   Hip flexion    Hip extension    Hip abduction 4+/4   Hip adduction    Hip internal rotation    Hip external rotation    Knee flexion    Knee  extension in pounds 56.4/55.6   Ankle dorsiflexion    Ankle plantarflexion    Ankle inversion    Ankle eversion     (Blank rows = not tested)  GAIT: Distance walked: 100 feet Assistive device utilized: None Level of assistance: Complete Independence Comments: NA  TREATMENT DATE: Lt Knee 02/07/2024 Recumbent bike Seat 5, Resistance level 4 for 5 minutes Clamshell with resistance in side-lying with black Thera-Band 2 sets of 10 x 3 seconds with slow eccentric (lie left and lift right) Supine figure 4 stretch for right leg only 5 x 20 seconds Seated straight leg raises 3 sets of 10 for 3 seconds bilateral 1.5# Trunk/lumbar extension active range of motion 10 x 3 seconds Shoulder blade pinches 10 x 5 seconds Yoga Bridge 2 sets of 10 for 5 seconds  02464: Reviewed basic kitchen body mechanics; bed mobility with log roll; reviewed and updated HEP   02/02/24 Seated straight leg raises 2 sets of 10 for 3 seconds bilateral Clamshell with resistance in side-lying with black Thera-Band  Supine figure 4 stretch for right leg only 4 x 20 seconds SLR 2x20 bil #1 Bridges 3x10 Hip adduction ball squeeze 10x10 sec   01/26/2024 Clamshell with resistance in side-lying with black Thera-Band 10 x 3 seconds with slow eccentric (lie left and lift right) Supine figure 4 stretch for right leg only 4 x 20 seconds Seated straight leg raises 2 sets of 10 for 3 seconds bilateral 97535: Reviewed imaging; discussed examination findings and day 1 home exercises   PATIENT EDUCATION:  Education details: See above Person educated: Patient Education method: Explanation, Demonstration, Tactile cues, Verbal cues, and Handouts Education comprehension: verbalized understanding, returned demonstration, verbal cues required, tactile cues required, and needs further education  HOME  EXERCISE PROGRAM: Access Code: [facility] URL: [email or web address] Date: 01/26/2024 Prepared by: Lamar Ivory  Exercises - Supine Figure 4 Piriformis Stretch  - 2 x daily - 7 x weekly - 1 sets - 5 reps - 20 seconds hold - Clamshell with Resistance  - 2 x daily - 7 x weekly - 2 sets - 10 reps - 3 seconds hold - Seated Straight Leg Raise   - 2 x daily - 7 x weekly - 2-3 sets - 10 reps  ASSESSMENT:  CLINICAL IMPRESSION: Shanell is doing a good job with her early home exercises.  We did a quick spine screen (5 degrees extension active range) and added some activities to help with her standing and ADLs due to back pain.  Progressions were added to her HEP and she is on schedule to meet long-term goals.  OBJECTIVE IMPAIRMENTS: cardiopulmonary status limiting activity, decreased activity tolerance, decreased endurance, decreased knowledge of condition, difficulty walking, decreased ROM, decreased strength, increased edema, impaired perceived functional ability, and pain.   ACTIVITY LIMITATIONS: carrying, lifting, bending, squatting, sleeping, stairs, and locomotion level  PARTICIPATION LIMITATIONS: interpersonal relationship, driving, community activity, and church  PERSONAL FACTORS: Knee OA, pre-diabetes, HTN are also affecting patient's functional outcome.   REHAB POTENTIAL: Good  CLINICAL DECISION MAKING: Evolving/moderate complexity  EVALUATION COMPLEXITY: Moderate   GOALS: Goals reviewed with patient? Yes  SHORT TERM GOALS: Target date: 02/23/2024 Earsie will be independent with her day 1 home exercise program Baseline: Started 01/26/2024 Goal status: Met 02/07/2024  2.  Improve right hip external rotation active range of motion to > 40 degrees Baseline: 34 degrees Goal status: INITIAL  3.  Improve bilateral quadriceps strength to at least 65 pounds Baseline: 56.4/55.6 Goal status: INITIAL   LONG TERM GOALS: Target date: 03/22/2024  Improve patient-specific  functional score to at least 7.5 Baseline: 5.5 Goal status: INITIAL  2.  Janann will report left knee and right hip pain consistently 0-3/10 on the visual analog scale Baseline: Left knee currently as high as 5/10 Goal status:  INITIAL  3.  Improve bilateral quadriceps strength to at least 70 pounds Baseline: 56.4/55.6 pounds Goal status: INITIAL  4.  Improve bilateral hip abductor strength to at least 5 -/5 MMT Baseline: 4+/4 MMT Goal status: INITIAL  5.  Tamerra will be independent with her long-term maintenance home exercise program at discharge Baseline: Started 01/26/2024 Goal status: INITIAL  PLAN:  PT FREQUENCY: 1-2x/week  PT DURATION: 8 weeks  PLANNED INTERVENTIONS: 97110-Therapeutic exercises, 97530- Therapeutic activity, 97112- Neuromuscular re-education, 97535- Self Care, 02859- Manual therapy, [id number]- Gait training, 97016- Vasopneumatic device, [phone] (1-2 muscles), 20561 (3+ muscles)- Dry Needling, Patient/Family education, Joint mobilization, and Cryotherapy  PLAN FOR NEXT SESSION: Emphasis on quadriceps and hip abductor strengthening while avoiding overuse.  Reassess hip range next visit.  Reassess PSFS and strength the 2nd week of December.   Myer LELON Ivory, PT, MPT 02/07/24  11:13 AM

## 2024-02-16 ENCOUNTER — Ambulatory Visit: Admitting: Rehabilitative and Restorative Service Providers"

## 2024-02-16 ENCOUNTER — Encounter: Payer: Self-pay | Admitting: Rehabilitative and Restorative Service Providers"

## 2024-02-16 DIAGNOSIS — M6281 Muscle weakness (generalized): Secondary | ICD-10-CM | POA: Diagnosis not present

## 2024-02-16 DIAGNOSIS — R262 Difficulty in walking, not elsewhere classified: Secondary | ICD-10-CM | POA: Diagnosis not present

## 2024-02-16 DIAGNOSIS — M25562 Pain in left knee: Secondary | ICD-10-CM

## 2024-02-16 DIAGNOSIS — R600 Localized edema: Secondary | ICD-10-CM | POA: Diagnosis not present

## 2024-02-16 DIAGNOSIS — Z1151 Encounter for screening for human papillomavirus (HPV): Secondary | ICD-10-CM | POA: Diagnosis not present

## 2024-02-16 DIAGNOSIS — M25551 Pain in right hip: Secondary | ICD-10-CM | POA: Diagnosis not present

## 2024-02-16 DIAGNOSIS — G8929 Other chronic pain: Secondary | ICD-10-CM

## 2024-02-16 DIAGNOSIS — Z1231 Encounter for screening mammogram for malignant neoplasm of breast: Secondary | ICD-10-CM | POA: Diagnosis not present

## 2024-02-16 DIAGNOSIS — R87615 Unsatisfactory cytologic smear of cervix: Secondary | ICD-10-CM | POA: Diagnosis not present

## 2024-02-16 DIAGNOSIS — Z6824 Body mass index (BMI) 24.0-24.9, adult: Secondary | ICD-10-CM | POA: Diagnosis not present

## 2024-02-16 DIAGNOSIS — Z1272 Encounter for screening for malignant neoplasm of vagina: Secondary | ICD-10-CM | POA: Diagnosis not present

## 2024-02-16 DIAGNOSIS — Z124 Encounter for screening for malignant neoplasm of cervix: Secondary | ICD-10-CM | POA: Diagnosis not present

## 2024-02-16 NOTE — Therapy (Signed)
 OUTPATIENT PHYSICAL THERAPY LOWER EXTREMITY TREATMENT  Patient Name: Victoria Stone MRN: [id number] DOB:February 21, 1951, 73 y.o., female Today's Date: 02/16/2024  END OF SESSION:  PT End of Session - 02/16/24 1145     Visit Number 4    Number of Visits 16    Date for Recertification  03/22/24    Authorization Type MEDICARE    Authorization - Number of Visits 16    Progress Note Due on Visit 10    PT Start Time 1144    PT Stop Time 1232    PT Time Calculation (min) 48 min    Activity Tolerance Patient tolerated treatment well;No increased pain    Behavior During Therapy [facility] for tasks assessed/performed             Past Medical History:  Diagnosis Date   ALLERGIC RHINITIS    ARTHRITIS    Arthritis of knee 11/20/2008   Carpal tunnel syndrome on right 04/04/2017   Chest pain 07/15/2015   Dyslipidemia    Family history of premature coronary artery disease 07/15/2015   GERD (gastroesophageal reflux disease) 02/08/2016   Obese    Prediabetes 04/06/2017   ULCERATIVE COLITIS    Ulcerative colitis (HCC) 11/20/2008   Past Surgical History:  Procedure Laterality Date   ABDOMINAL HYSTERECTOMY  03/15/1999   BONE SPUR     BREAST SURGERY  03/14/1986   Reduction   CARPAL TUNNEL RELEASE Right 12/31/2018   Procedure: RIGHT CARPAL TUNNEL RELEASE;  Surgeon: Murrell Drivers, MD;  Location: [facility];  Service: Orthopedics;  Laterality: Right;   COLONOSCOPY  02/14/2011   Dr.Patterson   FOOT SURGERY  90, 04, 09   X'S 3   HAMMER TOE SURGERY     LAPAROSCOPIC GASTRIC BANDING  03/15/2003   TUBAL LIGATION  03/14/1986   Patient Active Problem List   Diagnosis Date Noted   Pre-syncope 07/11/2023   Dyspnea on exertion 07/11/2023   Hypertension 06/01/2023   Coronary artery calcification seen on CT scan 02/06/2023   Pure hypercholesterolemia 02/06/2023   Sleep difficulties 11/06/2019   Prediabetes 04/06/2017   Bilateral carpal tunnel syndrome 04/04/2017   Family history of premature  CAD 07/20/2016   GERD (gastroesophageal reflux disease) 02/08/2016   Anxiety and depression 11/20/2008   Allergic rhinitis 11/20/2008   Arthritis of knee 11/20/2008    PCP: Glade DOROTHA Hope, MD  REFERRING PROVIDER: Lonni CINDERELLA Poli, MD  REFERRING DIAG:  Diagnosis  [phone] (ICD-10-CM) - Chronic pain of left knee  M54.41,G89.29 (ICD-10-CM) - Chronic right-sided low back pain with right-sided sciatica    THERAPY DIAG:  Difficulty in walking, not elsewhere classified  Localized edema  Muscle weakness (generalized)  Chronic pain of left knee  Pain in right hip  Rationale for Evaluation and Treatment: Rehabilitation  ONSET DATE: Chronic, worsening over the past few weeks  SUBJECTIVE:   SUBJECTIVE STATEMENT: Brytnee notes less left knee and right hip pain over the past week.  She reports partial HEP compliance.    PERTINENT HISTORY: Knee OA, pre-diabetes, HTN  PAIN:  Are you having pain? Yes: NPRS scale:  Left knee 0-1/10 and right hip 0-2/10 this week Pain location: Rt hip and left knee Pain description: Achy, can get sharp and stiff Aggravating factors: Sleeping on the right hip, squatting, stairs Relieving factors: NA  PRECAUTIONS: Other: Need to do a spine screen secondary to referral for radiculopathy  RED FLAGS: None   WEIGHT BEARING RESTRICTIONS: No  FALLS:  Has patient fallen in last  6 months? No  LIVING ENVIRONMENT: Lives with: lives with their family and lives with their spouse Lives in: House/apartment Stairs: Can be challenging Has following equipment at home: None  OCCUPATION: Retired  PLOF: Independent  PATIENT GOALS: Return to exercises, child sitting, church, daily stuff  NEXT MD VISIT: NA  OBJECTIVE:  Note: Objective measures were completed at Evaluation unless otherwise noted.  DIAGNOSTIC FINDINGS: 2 views of the left knee show well-maintained medial and lateral  compartments with neutral alignment.  There is significant  patellofemoral  arthritis.   An AP pelvis and lateral right hip shows both hip joint spaces  well-maintained with no cortical irregularities around the hips.  The  lower aspect of the lumbar spine on the AP view of the pelvis shows  significant degenerative disc disease between L5 and S1.   PATIENT SURVEYS:  PSFS: THE PATIENT SPECIFIC FUNCTIONAL SCALE  Place score of 0-10 (0 = unable to perform activity and 10 = able to perform activity at the same level as before injury or problem)  Activity Date: 01/26/2024 02/16/2024   Getting on and off the floor 5 5   2.  Squatting 5 7   3.  Walking 7 9   4.  Stairs 5 8   Total Score 5.5 7.25     Total Score = Sum of activity scores/number of activities  Minimally Detectable Change: 3 points (for single activity); 2 points (for average score)  [facility] (nd). The Patient Specific Functional Scale . Retrieved from [email or web address]   COGNITION: Overall cognitive status: Within functional limits for tasks assessed     SENSATION: No other complaints of peripheral pain or paresthesias  AROM SPINE:R mild gibbus , L shoulder slightly higher, L lateral shift, L iliac crest higher Spinal AROM flexion 75%, Lateral flexion L 50%, R 75%, extension 50%  No pain with movement   LOWER EXTREMITY ROM:  Passive ROM Left/Right 01/26/2024 Left/Right 02/16/2024  Hip flexion 110/110   Hip extension    Hip abduction    Hip adduction    Hip internal rotation 10/10   Hip external rotation 45/34 50/41  Knee flexion 142/143   Knee extension 0/0   Ankle dorsiflexion    Ankle plantarflexion    Ankle inversion    Ankle eversion    Hamstrings 50/50    (Blank rows = not tested)  STRENGTH:  MMT for hip abduction and hand-held dynamometer for knee extension Left/Right 01/26/2024 Left/Right 02/16/2024  Hip flexion    Hip extension    Hip abduction 4+/4 4+/4+  Hip adduction    Hip  internal rotation    Hip external rotation    Knee flexion    Knee extension in pounds 56.4/55.6 60.4/55.8  Ankle dorsiflexion    Ankle plantarflexion    Ankle inversion    Ankle eversion     (Blank rows = not tested)  GAIT: Distance walked: 100 feet Assistive device utilized: None Level of assistance: Complete Independence Comments: NA  TREATMENT DATE: Lt Knee 02/16/2024 Recumbent bike Seat 5, Resistance level 5 for 5 minutes Clamshell with resistance in side-lying with Thera-Band Black 2 sets of 10 x 3 seconds with slow eccentric (lie left and lift right) Supine figure 4 stretch for right leg only 5 x 20 seconds Seated straight leg raises 3 sets of 10 for 3 seconds bilateral 2# Trunk/lumbar extension active range of motion 2 sets of 5 x 3 seconds Shoulder blade pinches  2 sets of 5 x 5 seconds Yoga Bridge 2 sets of 10 for 5 seconds  02464: Reviewed objective assessments from today and made appropriate changes to her current home exercise program to facilitate participation    02/07/2024 Recumbent bike Seat 5, Resistance level 4 for 5 minutes Clamshell with resistance in side-lying with black Thera-Band 2 sets of 10 x 3 seconds with slow eccentric (lie left and lift right) Supine figure 4 stretch for right leg only 5 x 20 seconds Seated straight leg raises 3 sets of 10 for 3 seconds bilateral 1.5# Trunk/lumbar extension active range of motion 10 x 3 seconds Shoulder blade pinches 10 x 5 seconds Yoga Bridge 2 sets of 10 for 5 seconds  02464: Reviewed basic kitchen body mechanics; bed mobility with log roll; reviewed and updated HEP   02/02/24 Seated straight leg raises 2 sets of 10 for 3 seconds bilateral Clamshell with resistance in side-lying with black Thera-Band  Supine figure 4 stretch for right leg only 4 x 20 seconds SLR 2x20 bil #1 Bridges  3x10 Hip adduction ball squeeze 10x10 sec   PATIENT EDUCATION:  Education details: See above Person educated: Patient Education method: Explanation, Demonstration, Tactile cues, Verbal cues, and Handouts Education comprehension: verbalized understanding, returned demonstration, verbal cues required, tactile cues required, and needs further education  HOME EXERCISE PROGRAM: Access Code: [email or web address] URL: [email or web address] Date: 02/16/2024 Prepared by: Lamar Ivory  Exercises - Supine Figure 4 Piriformis Stretch  - 1 x daily - 7 x weekly - 1 sets - 5 reps - 20 seconds hold - Clamshell with Resistance  - 1 x daily - 3 x weekly - 2 sets - 10 reps - 3 seconds hold - Seated Straight Leg Raise   - 1 x daily - 3 x weekly - 3-5 sets - 10 reps - Yoga Bridge  - 1 x daily - 3 x weekly - 2 sets - 10 reps - 5 seconds hold - Standing Lumbar Extension at Wall - Forearms  - 2-3 x daily - 7 x weekly - 1 sets - 5 reps - 3 seconds hold - Standing Scapular Retraction  - 2-3 x daily - 7 x weekly - 1 sets - 5 reps - 5 second hold  ASSESSMENT:  CLINICAL IMPRESSION: Candies notes less left knee and right hip pain over the past week.  This is consistent with objective assessment that shows improved left quadriceps strength, right hip external rotation active range of motion and improved hip abductors strength.  We made some minor modifications to her home exercises to make it easier for her to be more consistent with her home exercise program participation.  Kataya appears on track to meet long-term goals.    OBJECTIVE IMPAIRMENTS: cardiopulmonary status limiting activity, decreased activity tolerance, decreased endurance, decreased knowledge of condition, difficulty walking, decreased ROM, decreased strength, increased edema, impaired perceived functional ability, and pain.   ACTIVITY LIMITATIONS: carrying, lifting, bending, squatting, sleeping, stairs, and locomotion level  PARTICIPATION  LIMITATIONS: interpersonal relationship, driving, community activity, and church  PERSONAL FACTORS: Knee OA, pre-diabetes, HTN are also affecting patient's functional outcome.   REHAB POTENTIAL: Good  CLINICAL DECISION MAKING: Evolving/moderate complexity  EVALUATION COMPLEXITY: Moderate   GOALS: Goals reviewed with patient? Yes  SHORT TERM GOALS: Target date: 02/23/2024 Ebonique will be independent with her day 1 home exercise program Baseline: Started 01/26/2024 Goal status: Met 02/07/2024  2.  Improve right hip external rotation active range of motion to > 40 degrees Baseline: 34 degrees Goal status: Met 02/16/2024  3.  Improve bilateral quadriceps strength to at least 65 pounds Baseline: 56.4/55.6 Goal status: Ongoing 02/16/2024   LONG TERM GOALS: Target date: 03/22/2024  Improve patient-specific functional score to at least 7.5 Baseline: 5.5 Goal status: Ongoing 02/16/2024  2.  Delight will report left knee and right hip pain consistently 0-3/10 on the visual analog scale Baseline: Left knee currently as high as 5/10 Goal status: Met 02/16/2024  3.  Improve bilateral quadriceps strength to at least 70 pounds Baseline: 56.4/55.6 pounds Goal status: INITIAL  4.  Improve bilateral hip abductor strength to at least 5 -/5 MMT Baseline: 4+/4 MMT Goal status: Ongoing 02/16/2024  5.  Bryauna will be independent with her long-term maintenance home exercise program at discharge Baseline: Started 01/26/2024 Goal status: INITIAL  PLAN:  PT FREQUENCY: 1-2x/week  PT DURATION: 8 weeks  PLANNED INTERVENTIONS: 97110-Therapeutic exercises, 97530- Therapeutic activity, 97112- Neuromuscular re-education, 97535- Self Care, 02859- Manual therapy, [id number]- Gait training, 97016- Vasopneumatic device, [phone] (1-2 muscles), 20561 (3+ muscles)- Dry Needling, Patient/Family education, Joint mobilization, and Cryotherapy  PLAN FOR NEXT SESSION: Emphasis on quadriceps and hip abductor  strengthening while avoiding overuse.  Appropriate progressions while avoiding overuse with weight-bearing.  How she doing with spine mechanics and her low back at home?   Myer LELON Ivory, PT, MPT 02/16/24  4:29 PM

## 2024-02-19 ENCOUNTER — Ambulatory Visit: Admitting: Orthopaedic Surgery

## 2024-02-21 ENCOUNTER — Encounter: Payer: Self-pay | Admitting: Gastroenterology

## 2024-02-23 ENCOUNTER — Encounter: Payer: Self-pay | Admitting: Rehabilitative and Restorative Service Providers"

## 2024-02-23 ENCOUNTER — Ambulatory Visit: Admitting: Rehabilitative and Restorative Service Providers"

## 2024-02-23 DIAGNOSIS — M25551 Pain in right hip: Secondary | ICD-10-CM

## 2024-02-23 DIAGNOSIS — R600 Localized edema: Secondary | ICD-10-CM | POA: Diagnosis not present

## 2024-02-23 DIAGNOSIS — R262 Difficulty in walking, not elsewhere classified: Secondary | ICD-10-CM | POA: Diagnosis not present

## 2024-02-23 DIAGNOSIS — M6281 Muscle weakness (generalized): Secondary | ICD-10-CM | POA: Diagnosis not present

## 2024-02-23 DIAGNOSIS — G8929 Other chronic pain: Secondary | ICD-10-CM | POA: Diagnosis not present

## 2024-02-23 DIAGNOSIS — M25562 Pain in left knee: Secondary | ICD-10-CM

## 2024-02-23 NOTE — Therapy (Signed)
 OUTPATIENT PHYSICAL THERAPY LOWER EXTREMITY TREATMENT  Patient Name: Victoria Stone MRN: [id number] DOB:Sep 14, 1950, 73 y.o., female Today's Date: 02/23/2024  END OF SESSION:  PT End of Session - 02/23/24 1100     Visit Number 5    Number of Visits 16    Date for Recertification  03/22/24    Authorization Type MEDICARE    Authorization - Number of Visits 16    Progress Note Due on Visit 10    PT Start Time 1059    PT Stop Time 1145    PT Time Calculation (min) 46 min    Activity Tolerance Patient tolerated treatment well;No increased pain    Behavior During Therapy Victoria Stone for tasks assessed/performed              Past Medical History:  Diagnosis Date   ALLERGIC RHINITIS    ARTHRITIS    Arthritis of knee 11/20/2008   Carpal tunnel syndrome on right 04/04/2017   Chest pain 07/15/2015   Dyslipidemia    Family history of premature coronary artery disease 07/15/2015   GERD (gastroesophageal reflux disease) 02/08/2016   Obese    Prediabetes 04/06/2017   ULCERATIVE COLITIS    Ulcerative colitis (HCC) 11/20/2008   Past Surgical History:  Procedure Laterality Date   ABDOMINAL HYSTERECTOMY  03/15/1999   BONE SPUR     BREAST SURGERY  03/14/1986   Reduction   CARPAL TUNNEL RELEASE Right 12/31/2018   Procedure: RIGHT CARPAL TUNNEL RELEASE;  Surgeon: Murrell Drivers, MD;  Location: [facility];  Service: Orthopedics;  Laterality: Right;   COLONOSCOPY  02/14/2011   Dr.Patterson   FOOT SURGERY  90, 04, 09   X'S 3   HAMMER TOE SURGERY     LAPAROSCOPIC GASTRIC BANDING  03/15/2003   TUBAL LIGATION  03/14/1986   Patient Active Problem List   Diagnosis Date Noted   Pre-syncope 07/11/2023   Dyspnea on exertion 07/11/2023   Hypertension 06/01/2023   Coronary artery calcification seen on CT scan 02/06/2023   Pure hypercholesterolemia 02/06/2023   Sleep difficulties 11/06/2019   Prediabetes 04/06/2017   Bilateral carpal tunnel syndrome 04/04/2017   Family history of  premature CAD 07/20/2016   GERD (gastroesophageal reflux disease) 02/08/2016   Anxiety and depression 11/20/2008   Allergic rhinitis 11/20/2008   Arthritis of knee 11/20/2008    PCP: Glade DOROTHA Hope, MD  REFERRING PROVIDER: Lonni CINDERELLA Poli, MD  REFERRING DIAG:  Diagnosis  [id number] (ICD-10-CM) - Chronic pain of left knee  M54.41,G89.29 (ICD-10-CM) - Chronic right-sided low back pain with right-sided sciatica    THERAPY DIAG:  Difficulty in walking, not elsewhere classified  Localized edema  Muscle weakness (generalized)  Chronic pain of left knee  Pain in right hip  Rationale for Evaluation and Treatment: Rehabilitation  ONSET DATE: Chronic, worsening over the past few weeks  SUBJECTIVE:   SUBJECTIVE STATEMENT: [name] reports less pain and dysfunction since starting PT.    PERTINENT HISTORY: Knee OA, pre-diabetes, HTN  PAIN:  Are you having pain? Yes: NPRS scale:  Left knee 0-1/1, low back  and right hip 0-2/10 this week Pain location: Rt hip, low back and left knee Pain description: Achy, can get stiff Aggravating factors: Better with sleeping on the right hip, squatting, stairs Relieving factors: NA  PRECAUTIONS: Other: Need to do a spine screen secondary to referral for radiculopathy  RED FLAGS: None   WEIGHT BEARING RESTRICTIONS: No  FALLS:  Has patient fallen in last 6 months? No  LIVING ENVIRONMENT: Lives with: lives with their family and lives with their spouse Lives in: House/apartment Stairs: Can be challenging Has following equipment at home: None  OCCUPATION: Retired  PLOF: Independent  PATIENT GOALS: Return to exercises, child sitting, church, daily stuff  NEXT MD VISIT: NA  OBJECTIVE:  Note: Objective measures were completed at Evaluation unless otherwise noted.  DIAGNOSTIC FINDINGS: 2 views of the left knee show well-maintained medial and lateral  compartments with neutral alignment.  There is significant  patellofemoral  arthritis.   An AP pelvis and lateral right hip shows both hip joint spaces  well-maintained with no cortical irregularities around the hips.  The  lower aspect of the lumbar spine on the AP view of the pelvis shows  significant degenerative disc disease between L5 and S1.   PATIENT SURVEYS:  PSFS: THE PATIENT SPECIFIC FUNCTIONAL SCALE  Place score of 0-10 (0 = unable to perform activity and 10 = able to perform activity at the same level as before injury or problem)  Activity Date: 01/26/2024 02/16/2024 02/23/2024  Getting on and off the floor 5 5 7   2.  Squatting 5 7 9   3.  Walking 7 9 9   4.  Stairs 5 8 8   Total Score 5.5 7.25 8.25    Total Score = Sum of activity scores/number of activities  Minimally Detectable Change: 3 points (for single activity); 2 points (for average score)  [facility] (nd). The Patient Specific Functional Scale . Retrieved from [email or web address]   COGNITION: Overall cognitive status: Within functional limits for tasks assessed     SENSATION: No other complaints of peripheral pain or paresthesias  AROM SPINE:R mild gibbus , L shoulder slightly higher, L lateral shift, L iliac crest higher Spinal AROM flexion 75%, Lateral flexion L 50%, R 75%, extension 50%  No pain with movement   LOWER EXTREMITY ROM:  Passive ROM Left/Right 01/26/2024 Left/Right 02/16/2024  Hip flexion 110/110   Hip extension    Hip abduction    Hip adduction    Hip internal rotation 10/10   Hip external rotation 45/34 50/41  Knee flexion 142/143   Knee extension 0/0   Ankle dorsiflexion    Ankle plantarflexion    Ankle inversion    Ankle eversion    Hamstrings 50/50    (Blank rows = not tested)  STRENGTH:  MMT for hip abduction and hand-held dynamometer for knee extension Left/Right 01/26/2024 Left/Right 02/16/2024  Hip flexion    Hip extension    Hip abduction 4+/4 4+/4+  Hip  adduction    Hip internal rotation    Hip external rotation    Knee flexion    Knee extension in pounds 56.4/55.6 60.4/55.8  Ankle dorsiflexion    Ankle plantarflexion    Ankle inversion    Ankle eversion     (Blank rows = not tested)  GAIT: Distance walked: 100 feet Assistive device utilized: None Level of assistance: Complete Independence Comments: NA  TREATMENT DATE: Lt Knee 02/23/2024 Recumbent bike Seat 5, Resistance level 7 for 5 minutes, :25 at 9-10 MPH and :05 sprint Supine figure 4 stretch for right leg only 5 x 20 seconds Trunk/lumbar extension active range of motion 2 sets of 5 x 3 seconds Yoga Bridge 2 sets of 10 for 10 seconds  02464: Reviewed appropriate changes to her current home exercise program   Functional Activities: Hip hike at counter top and in door frame 2 sets of 10 x 3 seconds each (needs lots of feedback and correction)  Neuromuscular re-education: Tandem balance eyes open; head turning and eyes closed 4 x 20 seconds each Single leg stance eyes open 10 x 10 seconds   02/16/2024 Recumbent bike Seat 5, Resistance level 5 for 5 minutes Clamshell with resistance in side-lying with Thera-Band Black 2 sets of 10 x 3 seconds with slow eccentric (lie left and lift right) Supine figure 4 stretch for right leg only 5 x 20 seconds Seated straight leg raises 3 sets of 10 for 3 seconds bilateral 2# Trunk/lumbar extension active range of motion 2 sets of 5 x 3 seconds Shoulder blade pinches  2 sets of 5 x 5 seconds Yoga Bridge 2 sets of 10 for 5 seconds  02464: Reviewed objective assessments from today and made appropriate changes to her current home exercise program to facilitate participation    02/07/2024 Recumbent bike Seat 5, Resistance level 4 for 5 minutes Clamshell with resistance in side-lying with black Thera-Band 2 sets of  10 x 3 seconds with slow eccentric (lie left and lift right) Supine figure 4 stretch for right leg only 5 x 20 seconds Seated straight leg raises 3 sets of 10 for 3 seconds bilateral 1.5# Trunk/lumbar extension active range of motion 10 x 3 seconds Shoulder blade pinches 10 x 5 seconds Yoga Bridge 2 sets of 10 for 5 seconds  02464: Reviewed basic kitchen body mechanics; bed mobility with log roll; reviewed and updated HEP   PATIENT EDUCATION:  Education details: See above Person educated: Patient Education method: Explanation, Demonstration, Tactile cues, Verbal cues, and Handouts Education comprehension: verbalized understanding, returned demonstration, verbal cues required, tactile cues required, and needs further education  HOME EXERCISE PROGRAM: Access Code: [facility] URL: [email or web address] Date: 02/23/2024 Prepared by: Lamar Ivory  Exercises - Supine Figure 4 Piriformis Stretch  - 1 x daily - 7 x weekly - 1 sets - 5 reps - 20 seconds hold - Clamshell with Resistance  - 1 x daily - 3 x weekly - 2 sets - 10 reps - 3 seconds hold - Seated Straight Leg Raise   - 1 x daily - 3 x weekly - 3-5 sets - 10 reps - Yoga Bridge  - 1 x daily - 3 x weekly - 2 sets - 10 reps - 5 seconds hold - Standing Lumbar Extension at Wall - Forearms  - 2-3 x daily - 7 x weekly - 1 sets - 5 reps - 3 seconds hold - Standing Scapular Retraction  - 2-3 x daily - 7 x weekly - 1 sets - 5 reps - 5 second hold - Tandem Stance  - 1 x daily - 3-7 x weekly - 1 sets - 10 reps - 20 second hold - Single Leg Stance  - 1 x daily - 3-7 x weekly - 1 sets - 10 reps - 10 seconds hold - Standing Hip Hiking  - 2 x daily - 7 x weekly - 2 sets - 10 reps -  3 seconds hold  ASSESSMENT:  CLINICAL IMPRESSION: Laporche notes less low back, left knee and right hip pain over the past few weeks.  Left quadriceps, bilateral hip abductors and lumbar extensors strength will continue to benefit from supervised PT and  prepare her for transfer into independent rehabilitation.  Kennadi appears on track to meet long-term goals.    OBJECTIVE IMPAIRMENTS: cardiopulmonary status limiting activity, decreased activity tolerance, decreased endurance, decreased knowledge of condition, difficulty walking, decreased ROM, decreased strength, increased edema, impaired perceived functional ability, and pain.   ACTIVITY LIMITATIONS: carrying, lifting, bending, squatting, sleeping, stairs, and locomotion level  PARTICIPATION LIMITATIONS: interpersonal relationship, driving, community activity, and church  PERSONAL FACTORS: Knee OA, pre-diabetes, HTN are also affecting patient's functional outcome.   REHAB POTENTIAL: Good  CLINICAL DECISION MAKING: Evolving/moderate complexity  EVALUATION COMPLEXITY: Moderate   GOALS: Goals reviewed with patient? Yes  SHORT TERM GOALS: Target date: 02/23/2024 Katheren will be independent with her day 1 home exercise program Baseline: Started 01/26/2024 Goal status: Met 02/07/2024  2.  Improve right hip external rotation active range of motion to > 40 degrees Baseline: 34 degrees Goal status: Met 02/16/2024  3.  Improve bilateral quadriceps strength to at least 65 pounds Baseline: 56.4/55.6 Goal status: Ongoing 02/16/2024   LONG TERM GOALS: Target date: 03/22/2024  Improve patient-specific functional score to at least 7.5 Baseline: 5.5 Goal status: Ongoing 02/16/2024  2.  Shanessa will report left knee and right hip pain consistently 0-3/10 on the visual analog scale Baseline: Left knee currently as high as 5/10 Goal status: Met 02/16/2024  3.  Improve bilateral quadriceps strength to at least 70 pounds Baseline: 56.4/55.6 pounds Goal status: INITIAL  4.  Improve bilateral hip abductor strength to at least 5 -/5 MMT Baseline: 4+/4 MMT Goal status: Ongoing 02/16/2024  5.  Lilyahna will be independent with her long-term maintenance home exercise program at discharge Baseline:  Started 01/26/2024 Goal status: Ongoing 02/23/2024  PLAN:  PT FREQUENCY: 1-2x/week  PT DURATION: 8 weeks  PLANNED INTERVENTIONS: 97110-Therapeutic exercises, 97530- Therapeutic activity, 97112- Neuromuscular re-education, 97535- Self Care, 02859- Manual therapy, [id number]- Gait training, [phone]- Vasopneumatic device, [phone] (1-2 muscles), 20561 (3+ muscles)- Dry Needling, Patient/Family education, Joint mobilization, and Cryotherapy  PLAN FOR NEXT SESSION: Emphasis on quadriceps, low back and hip abductor strengthening while avoiding overuse.  Appropriate progressions while avoiding overuse with weight-bearing.  How is she doing with consistency with spine mechanics and her low back at home?   Myer LELON Ivory, PT, MPT 02/23/2024  4:46 PM

## 2024-02-28 ENCOUNTER — Encounter: Payer: Self-pay | Admitting: Emergency Medicine

## 2024-02-28 ENCOUNTER — Ambulatory Visit: Admitting: Emergency Medicine

## 2024-02-28 VITALS — BP 118/84 | HR 94 | Temp 98.0°F | Ht 65.0 in

## 2024-02-28 DIAGNOSIS — H6122 Impacted cerumen, left ear: Secondary | ICD-10-CM | POA: Insufficient documentation

## 2024-02-28 NOTE — Assessment & Plan Note (Signed)
 Left before getting irrigation done.  Unknown reason.

## 2024-02-28 NOTE — Patient Instructions (Signed)
 Earwax Buildup, Adult Your ears make something called earwax. It helps keep germs called bacteria away and protects the skin in your ears. Sometimes, too much earwax can build up. This can cause discomfort or make it harder to hear. What are the causes? Earwax buildup can happen when you have too much earwax in your ears. Earwax is made in the outer part of your ear canal. It's supposed to fall out in small amounts over time. But if your ears aren't able to clean themselves like they should, earwax can build up. What increases the risk? You're more likely to get earwax buildup if: You clean your ears with cotton swabs. You pick at your ears. You use earplugs or in-ear headphones a lot. You wear hearing aids. You may also be more likely to get it if: You're female. You're older. Your ears naturally make more earwax. You have narrow ear canals or extra hair in your ears. Your earwax is too thick or sticky. You have eczema. You're dehydrated. This means there's not enough fluid in your body. What are the signs or symptoms? Symptoms of earwax buildup include: Not being able to hear as well. A feeling of fullness in your ear. Feeling like your ear is plugged. Fluid coming from your ear. Ear pain or an itchy ear. Ringing in your ear. Coughing or problems with balance. How is this diagnosed? Earwax buildup may be diagnosed based on your symptoms, medical history, and an ear exam. During the exam, your health care provider will look into your ear with a tool called an otoscope. You may also have tests, such as a hearing test. How is this treated? Earwax buildup may be treated by: Using ear drops. Having the earwax removed by a provider. The provider may: Flush the ear with water. Use a tool called a curette that has a loop on the end. Use a suction device. Having surgery. This may be done in severe cases. Follow these instructions at home:  Cleaning your ears Clean your ears as told  by your provider. You can clean the outside of your ears with a washcloth or tissue. Do not overclean your ears. Do not put anything into your ear unless told. This includes cotton swabs. General instructions Take over-the-counter and prescription medicines only as told by your provider. Drink enough fluid to keep your pee (urine) pale yellow. This helps thin the earwax. If you have hearing aids, clean them as told. Keep all follow-up visits. If earwax builds up in your ears often or if you use hearing aids, ask your provider how often you should have your ears cleaned. Contact a health care provider if: Your ear pain gets worse. You have a fever. You have pus, blood, or other fluid coming from your ear. You have hearing loss. You have ringing in your ears that won't go away. You feel like the room is spinning. This is called vertigo. Your symptoms don't get better with treatment. This information is not intended to replace advice given to you by your health care provider. Make sure you discuss any questions you have with your health care provider. Document Revised: 05/12/2022 Document Reviewed: 05/12/2022 Elsevier Patient Education  2024 [employer].

## 2024-02-28 NOTE — Progress Notes (Signed)
 Victoria Stone 73 y.o.   Chief Complaint  Patient presents with   Ear irrigation    HISTORY OF PRESENT ILLNESS: This is a 73 y.o. female complaining of left ear stuffed up and unable to hear No other associated symptoms No other complaints or medical concerns today.  HPI   Prior to Admission medications  Medication Sig Start Date End Date Taking? Authorizing Provider  atorvastatin  (LIPITOR) 20 MG tablet TAKE 1 TABLET BY MOUTH EVERY DAY 08/14/23  Yes Burns, Glade PARAS, MD  Calcium  Carbonate (CALCIUM  600 PO) Take by mouth daily.   Yes [provider]  famotidine  (PEPCID ) 40 MG tablet TAKE 1 TABLET BY MOUTH EVERY DAY 11/09/23  Yes Burns, Glade PARAS, MD  FLUoxetine  (PROZAC ) 20 MG capsule Take 1 capsule (20 mg total) by mouth daily. 06/01/23  Yes Burns, Glade PARAS, MD  meloxicam  (MOBIC ) 7.5 MG tablet TAKE 1 TABLET (7.5 MG TOTAL) BY MOUTH EVERY OTHER DAY. Patient taking differently: Take 7.5 mg by mouth daily. 01/08/24  Yes Burns, Glade PARAS, MD  Multiple Vitamins-Minerals (MULTIVITAMIN) tablet Take 1 tablet by mouth daily. 02/08/16  Yes Burns, Glade PARAS, MD  pantoprazole  (PROTONIX ) 40 MG tablet Take 1 tablet (40 mg total) by mouth 2 (two) times daily. 01/22/24  Yes Burns, Glade PARAS, MD  telmisartan  (MICARDIS ) 20 MG tablet TAKE 1 TABLET BY MOUTH EVERY DAY 11/06/23  Yes Burns, Glade PARAS, MD  triamcinolone  ointment (KENALOG ) 0.5 % Apply 1 Application topically 2 (two) times daily as needed (foot rash). 10/31/22  Yes Burns, Glade PARAS, MD  VITAMIN D , ERGOCALCIFEROL , PO Take 1,000 Units by mouth.   Yes [provider]  zaleplon  (SONATA ) 5 MG capsule Take 1 capsule (5 mg total) by mouth at bedtime as needed for sleep. 01/15/24  Yes Burns, Glade PARAS, MD  meloxicam  (MOBIC ) 7.5 MG tablet Take 7.5 mg by mouth daily. Patient not taking: Reported on 02/28/2024    [provider]    Allergies[1]  Patient Active Problem List   Diagnosis Date Noted   Pre-syncope 07/11/2023   Dyspnea on exertion  07/11/2023   Hypertension 06/01/2023   Coronary artery calcification seen on CT scan 02/06/2023   Pure hypercholesterolemia 02/06/2023   Sleep difficulties 11/06/2019   Prediabetes 04/06/2017   Bilateral carpal tunnel syndrome 04/04/2017   Family history of premature CAD 07/20/2016   GERD (gastroesophageal reflux disease) 02/08/2016   Anxiety and depression 11/20/2008   Allergic rhinitis 11/20/2008   Arthritis of knee 11/20/2008    Past Medical History:  Diagnosis Date   ALLERGIC RHINITIS    ARTHRITIS    Arthritis of knee 11/20/2008   Carpal tunnel syndrome on right 04/04/2017   Chest pain 07/15/2015   Dyslipidemia    Family history of premature coronary artery disease 07/15/2015   GERD (gastroesophageal reflux disease) 02/08/2016   Obese    Prediabetes 04/06/2017   ULCERATIVE COLITIS    Ulcerative colitis (HCC) 11/20/2008    Past Surgical History:  Procedure Laterality Date   ABDOMINAL HYSTERECTOMY  03/15/1999   BONE SPUR     BREAST SURGERY  03/14/1986   Reduction   CARPAL TUNNEL RELEASE Right 12/31/2018   Procedure: RIGHT CARPAL TUNNEL RELEASE;  Surgeon: Murrell Drivers, MD;  Location: [facility];  Service: Orthopedics;  Laterality: Right;   COLONOSCOPY  02/14/2011   Dr.Patterson   FOOT SURGERY  90, 04, 09   X'S 3   HAMMER TOE SURGERY     LAPAROSCOPIC GASTRIC BANDING  03/15/2003  TUBAL LIGATION  03/14/1986    Social History   Socioeconomic History   Marital status: Married    Spouse name: Not on file   Number of children: 2   Years of education: Not on file   Highest education level: Bachelor's degree (e.g., BA, AB, BS)  Occupational History   Occupation: Teacher, Adult Education: [employer]  Tobacco Use   Smoking status: Never   Smokeless tobacco: Never   Tobacco comments:    Married, 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[employer]  Vaping Use   Vaping status: Never Used  Substance and Sexual Activity   Alcohol use: Yes    Alcohol/week: 1.0 standard drink of  alcohol    Types: 1 Glasses of wine per week    Comment: occasionally   Drug use: No   Sexual activity: Yes  Other Topics Concern   Not on file  Social History Narrative   RN at [employer]   Married, lives with spouse   Social Drivers of Health   Tobacco Use: Low Risk (02/28/2024)   Patient History    Smoking Tobacco Use: Never    Smokeless Tobacco Use: Never    Passive Exposure: Not on file  Financial Resource Strain: Low Risk (01/14/2024)   Overall Financial Resource Strain (CARDIA)    Difficulty of Paying Living Expenses: Not hard at all  Food Insecurity: No Food Insecurity (01/14/2024)   Epic    Worried About Radiation Protection Practitioner of Food in the Last Year: Never true    Ran Out of Food in the Last Year: Never true  Transportation Needs: No Transportation Needs (01/14/2024)   Epic    Lack of Transportation (Medical): No    Lack of Transportation (Non-Medical): No  Physical Activity: Sufficiently Active (01/14/2024)   Exercise Vital Sign    Days of Exercise per Week: 6 days    Minutes of Exercise per Session: 30 min  Stress: Stress Concern Present (01/14/2024)   [employer] of Occupational Health - Occupational Stress Questionnaire    Feeling of Stress: Rather much  Social Connections: Socially Integrated (01/14/2024)   Social Connection and Isolation Panel    Frequency of Communication with Friends and Family: More than three times a week    Frequency of Social Gatherings with Friends and Family: More than three times a week    Attends Religious Services: More than 4 times per year    Active Member of Clubs or Organizations: Yes    Attends Banker Meetings: More than 4 times per year    Marital Status: Married  Catering Manager Violence: Not At Risk (11/10/2023)   Epic    Fear of Current or Ex-Partner: No    Emotionally Abused: No    Physically Abused: No    Sexually Abused: No  Depression (PHQ2-9): Low Risk (01/15/2024)   Depression (PHQ2-9)    PHQ-2  Score: 2  Alcohol Screen: Low Risk (01/14/2024)   Alcohol Screen    Last Alcohol Screening Score (AUDIT): 1  Housing: Low Risk (01/14/2024)   Epic    Unable to Pay for Housing in the Last Year: No    Number of Times Moved in the Last Year: 0    Homeless in the Last Year: No  Utilities: Not At Risk (11/10/2023)   Epic    Threatened with loss of utilities: No  Health Literacy: Adequate Health Literacy (11/10/2023)   B1300 Health Literacy    Frequency of need for help with medical  instructions: Never    Family History  Problem Relation Age of Onset   Hypertension Mother    Lymphoma Mother    Other Mother        PACEMAKER   Heart disease Mother    Heart attack Mother    Hypertension Father    Heart disease Father    Heart attack Father    Hypertension Sister    Diabetes Sister    Heart attack Sister    Heart disease Sister    Breast cancer Sister    Heart disease Brother    Breast cancer Brother    Hypertension Maternal Grandmother    Hypertension Paternal Grandmother    Hypertension Paternal Grandfather    Colon cancer Neg Hx      Review of Systems  Constitutional:  Negative for chills and fever.  HENT:  Positive for hearing loss.   Respiratory:  Negative for cough and shortness of breath.   Cardiovascular:  Negative for chest pain and palpitations.  Gastrointestinal:  Negative for nausea and vomiting.  All other systems reviewed and are negative.   Vitals:   02/28/24 0923  BP: 118/84  Pulse: 94  Temp: 98 F (36.7 C)  SpO2: 97%    Physical Exam Vitals reviewed.  Constitutional:      Appearance: Normal appearance.  HENT:     Head: Normocephalic.     Right Ear: Tympanic membrane, ear canal and external ear normal.     Left Ear: There is impacted cerumen.  Eyes:     Extraocular Movements: Extraocular movements intact.  Cardiovascular:     Rate and Rhythm: Normal rate.  Pulmonary:     Effort: Pulmonary effort is normal.  Skin:    General: Skin is warm  and dry.     Capillary Refill: Capillary refill takes less than 2 seconds.  Neurological:     General: No focal deficit present.     Mental Status: She is alert and oriented to person, place, and time.  Psychiatric:        Mood and Affect: Mood normal.        Behavior: Behavior normal.      ASSESSMENT & PLAN: Problem List Items Addressed This Visit       Nervous and Auditory   Hearing loss of left ear due to cerumen impaction - Primary   Left before getting irrigation done.  Unknown reason.      Patient Instructions  Earwax Buildup, Adult Your ears make something called earwax. It helps keep germs called bacteria away and protects the skin in your ears. Sometimes, too much earwax can build up. This can cause discomfort or make it harder to hear. What are the causes? Earwax buildup can happen when you have too much earwax in your ears. Earwax is made in the outer part of your ear canal. It's supposed to fall out in small amounts over time. But if your ears aren't able to clean themselves like they should, earwax can build up. What increases the risk? You're more likely to get earwax buildup if: You clean your ears with cotton swabs. You pick at your ears. You use earplugs or in-ear headphones a lot. You wear hearing aids. You may also be more likely to get it if: You're female. You're older. Your ears naturally make more earwax. You have narrow ear canals or extra hair in your ears. Your earwax is too thick or sticky. You have eczema. You're dehydrated. This means there's not enough  fluid in your body. What are the signs or symptoms? Symptoms of earwax buildup include: Not being able to hear as well. A feeling of fullness in your ear. Feeling like your ear is plugged. Fluid coming from your ear. Ear pain or an itchy ear. Ringing in your ear. Coughing or problems with balance. How is this diagnosed? Earwax buildup may be diagnosed based on your symptoms, medical  history, and an ear exam. During the exam, your health care provider will look into your ear with a tool called an otoscope. You may also have tests, such as a hearing test. How is this treated? Earwax buildup may be treated by: Using ear drops. Having the earwax removed by a provider. The provider may: Flush the ear with water. Use a tool called a curette that has a loop on the end. Use a suction device. Having surgery. This may be done in severe cases. Follow these instructions at home:  Cleaning your ears Clean your ears as told by your provider. You can clean the outside of your ears with a washcloth or tissue. Do not overclean your ears. Do not put anything into your ear unless told. This includes cotton swabs. General instructions Take over-the-counter and prescription medicines only as told by your provider. Drink enough fluid to keep your pee (urine) pale yellow. This helps thin the earwax. If you have hearing aids, clean them as told. Keep all follow-up visits. If earwax builds up in your ears often or if you use hearing aids, ask your provider how often you should have your ears cleaned. Contact a health care provider if: Your ear pain gets worse. You have a fever. You have pus, blood, or other fluid coming from your ear. You have hearing loss. You have ringing in your ears that won't go away. You feel like the room is spinning. This is called vertigo. Your symptoms don't get better with treatment. This information is not intended to replace advice given to you by your health care provider. Make sure you discuss any questions you have with your health care provider. Document Revised: 05/12/2022 Document Reviewed: 05/12/2022 Elsevier Patient Education  2024 Elsevier Inc.     Emil Schaumann, MD [facility]    [1]  Allergies Allergen Reactions   Penicillins Hives and Rash   Trazodone  And Nefazodone Other (See Comments)    Affected memory

## 2024-03-01 ENCOUNTER — Ambulatory Visit: Admitting: Rehabilitative and Restorative Service Providers"

## 2024-03-01 ENCOUNTER — Encounter: Payer: Self-pay | Admitting: Rehabilitative and Restorative Service Providers"

## 2024-03-01 DIAGNOSIS — R262 Difficulty in walking, not elsewhere classified: Secondary | ICD-10-CM | POA: Diagnosis not present

## 2024-03-01 DIAGNOSIS — M25562 Pain in left knee: Secondary | ICD-10-CM | POA: Diagnosis not present

## 2024-03-01 DIAGNOSIS — G8929 Other chronic pain: Secondary | ICD-10-CM | POA: Diagnosis not present

## 2024-03-01 DIAGNOSIS — R600 Localized edema: Secondary | ICD-10-CM | POA: Diagnosis not present

## 2024-03-01 DIAGNOSIS — M25551 Pain in right hip: Secondary | ICD-10-CM | POA: Diagnosis not present

## 2024-03-01 DIAGNOSIS — M6281 Muscle weakness (generalized): Secondary | ICD-10-CM | POA: Diagnosis not present

## 2024-03-01 NOTE — Therapy (Signed)
 " OUTPATIENT PHYSICAL THERAPY LOWER EXTREMITY TREATMENT  Patient Name: Victoria Stone MRN: [id number] DOB:[birth year], 73 y.o., female Today's Date: 03/01/2024  END OF SESSION:  PT End of Session - 03/01/24 1104     Visit Number 6    Number of Visits 16    Date for Recertification  03/22/24    Authorization Type MEDICARE    Authorization - Number of Visits 16    Progress Note Due on Visit 10    PT Start Time 1103    PT Stop Time 1144    PT Time Calculation (min) 41 min    Activity Tolerance Patient tolerated treatment well;No increased pain    Behavior During Therapy [facility] for tasks assessed/performed               Past Medical History:  Diagnosis Date   ALLERGIC RHINITIS    ARTHRITIS    Arthritis of knee 11/20/2008   Carpal tunnel syndrome on right 04/04/2017   Chest pain 07/15/2015   Dyslipidemia    Family history of premature coronary artery disease 07/15/2015   GERD (gastroesophageal reflux disease) 02/08/2016   Obese    Prediabetes 04/06/2017   ULCERATIVE COLITIS    Ulcerative colitis (HCC) 11/20/2008   Past Surgical History:  Procedure Laterality Date   ABDOMINAL HYSTERECTOMY  03/15/1999   BONE SPUR     BREAST SURGERY  03/14/1986   Reduction   CARPAL TUNNEL RELEASE Right 12/31/2018   Procedure: RIGHT CARPAL TUNNEL RELEASE;  Surgeon: Murrell Drivers, MD;  Location: [facility];  Service: Orthopedics;  Laterality: Right;   COLONOSCOPY  02/14/2011   Dr.Patterson   FOOT SURGERY  90, 04, 09   X'S 3   HAMMER TOE SURGERY     LAPAROSCOPIC GASTRIC BANDING  03/15/2003   TUBAL LIGATION  03/14/1986   Patient Active Problem List   Diagnosis Date Noted   Hearing loss of left ear due to cerumen impaction 02/28/2024   Pre-syncope 07/11/2023   Dyspnea on exertion 07/11/2023   Hypertension 06/01/2023   Coronary artery calcification seen on CT scan 02/06/2023   Pure hypercholesterolemia 02/06/2023   Sleep difficulties 11/06/2019   Prediabetes 04/06/2017    Bilateral carpal tunnel syndrome 04/04/2017   Family history of premature CAD 07/20/2016   GERD (gastroesophageal reflux disease) 02/08/2016   Anxiety and depression 11/20/2008   Allergic rhinitis 11/20/2008   Arthritis of knee 11/20/2008    PCP: Glade DOROTHA Hope, MD  REFERRING PROVIDER: Lonni CINDERELLA Poli, MD  REFERRING DIAG:  Diagnosis  [phone] (ICD-10-CM) - Chronic pain of left knee  M54.41,G89.29 (ICD-10-CM) - Chronic right-sided low back pain with right-sided sciatica    THERAPY DIAG:  Difficulty in walking, not elsewhere classified  Localized edema  Muscle weakness (generalized)  Chronic pain of left knee  Pain in right hip  Rationale for Evaluation and Treatment: Rehabilitation  ONSET DATE: Chronic, worsening over the past few weeks  SUBJECTIVE:   SUBJECTIVE STATEMENT: Victoria Stone reports minimal pain and dysfunction since her last PT visit.  She is doing her home exercises and getting more comfortable with the idea of transfer into independent rehabilitation.  PERTINENT HISTORY: Knee OA, pre-diabetes, HTN  PAIN:  Are you having pain? Yes: NPRS scale:  Left knee 0-1/1, low back  and right hip 0-2/10 this week Pain location: Rt hip, low back and left knee Pain description: Achy, can get stiff Aggravating factors: Better with sleeping on the right hip, squatting, stairs Relieving factors: NA  PRECAUTIONS: Other:  Need to do a spine screen secondary to referral for radiculopathy  RED FLAGS: None   WEIGHT BEARING RESTRICTIONS: No  FALLS:  Has patient fallen in last 6 months? No  LIVING ENVIRONMENT: Lives with: lives with their family and lives with their spouse Lives in: House/apartment Stairs: Can be challenging Has following equipment at home: None  OCCUPATION: Retired  PLOF: Independent  PATIENT GOALS: Return to exercises, child sitting, church, daily stuff  NEXT MD VISIT: NA  OBJECTIVE:  Note: Objective measures were completed at  Evaluation unless otherwise noted.  DIAGNOSTIC FINDINGS: 2 views of the left knee show well-maintained medial and lateral  compartments with neutral alignment.  There is significant patellofemoral  arthritis.   An AP pelvis and lateral right hip shows both hip joint spaces  well-maintained with no cortical irregularities around the hips.  The  lower aspect of the lumbar spine on the AP view of the pelvis shows  significant degenerative disc disease between L5 and S1.   PATIENT SURVEYS:  PSFS: THE PATIENT SPECIFIC FUNCTIONAL SCALE  Place score of 0-10 (0 = unable to perform activity and 10 = able to perform activity at the same level as before injury or problem)  Activity Date: 01/26/2024 02/16/2024 02/23/2024  Getting on and off the floor 5 5 7   2.  Squatting 5 7 9   3.  Walking 7 9 9   4.  Stairs 5 8 8   Total Score 5.5 7.25 8.25    Total Score = Sum of activity scores/number of activities  Minimally Detectable Change: 3 points (for single activity); 2 points (for average score)  [employer] [facility] (nd). The Patient Specific Functional Scale . Retrieved from [email or web address]   COGNITION: Overall cognitive status: Within functional limits for tasks assessed     SENSATION: No other complaints of peripheral pain or paresthesias  AROM SPINE:R mild gibbus , L shoulder slightly higher, L lateral shift, L iliac crest higher Spinal AROM flexion 75%, Lateral flexion L 50%, R 75%, extension 50%  No pain with movement   LOWER EXTREMITY ROM:  Passive ROM Left/Right 01/26/2024 Left/Right 02/16/2024 Left/Right 03/01/2024  Hip flexion 110/110    Hip extension     Hip abduction     Hip adduction     Hip internal rotation 10/10    Hip external rotation 45/34 50/41 50/40   Knee flexion 142/143    Knee extension 0/0    Ankle dorsiflexion     Ankle plantarflexion     Ankle inversion     Ankle eversion     Hamstrings 50/50      (Blank rows = not tested)  STRENGTH:  MMT for hip abduction and hand-held dynamometer for knee extension Left/Right 01/26/2024 Left/Right 02/16/2024  Hip flexion    Hip extension    Hip abduction 4+/4 4+/4+  Hip adduction    Hip internal rotation    Hip external rotation    Knee flexion    Knee extension in pounds 56.4/55.6 60.4/55.8  Ankle dorsiflexion    Ankle plantarflexion    Ankle inversion    Ankle eversion     (Blank rows = not tested)  GAIT: Distance walked: 100 feet Assistive device utilized: None Level of assistance: Complete Independence Comments: NA  TREATMENT DATE: Lt Knee 03/01/2024 Clamshell with resistance in side-lying with Thera-Band Black 2 sets of 10 x 3 seconds with slow eccentric (lie left and lift right) Supine figure 4 stretch for right leg only 5 x 20 seconds Seated straight leg raises 2 sets of 10 for 3 seconds bilateral 2# Trunk/lumbar extension active range of motion 10 x 3 seconds Shoulder blade pinches HEP Yoga Bridge 2 sets of 10 for 5 seconds Seated knee extension machine up bilateral, down left only, slow eccentrics 10# 20 reps  97535: Functional Activities: Hip hike in parallel bars 2 sets of 10 x 3 seconds each (needs feedback for postural correction)  Neuromuscular re-education: Tandem balance eyes open; head turning and eyes closed 4 x 20 seconds each Single leg stance eyes open 10 x 10 seconds   02/23/2024 Recumbent bike Seat 5, Resistance level 7 for 5 minutes, :25 at 9-10 MPH and :05 sprint Supine figure 4 stretch for right leg only 5 x 20 seconds Trunk/lumbar extension active range of motion 2 sets of 5 x 3 seconds Yoga Bridge 2 sets of 10 for 10 seconds  02464: Reviewed appropriate changes to her current home exercise program   Functional Activities: Hip hike at counter top and in door frame 2 sets of  10 x 3 seconds each (needs lots of feedback and correction)  Neuromuscular re-education: Tandem balance eyes open; head turning and eyes closed 4 x 20 seconds each Single leg stance eyes open 10 x 10 seconds   02/16/2024 Recumbent bike Seat 5, Resistance level 5 for 5 minutes Clamshell with resistance in side-lying with Thera-Band Black 2 sets of 10 x 3 seconds with slow eccentric (lie left and lift right) Supine figure 4 stretch for right leg only 5 x 20 seconds Seated straight leg raises 3 sets of 10 for 3 seconds bilateral 2# Trunk/lumbar extension active range of motion 2 sets of 5 x 3 seconds Shoulder blade pinches  2 sets of 5 x 5 seconds Yoga Bridge 2 sets of 10 for 5 seconds  02464: Reviewed objective assessments from today and made appropriate changes to her current home exercise program to facilitate participation    PATIENT EDUCATION:  Education details: See above Person educated: Patient Education method: Explanation, Demonstration, Tactile cues, Verbal cues, and Handouts Education comprehension: verbalized understanding, returned demonstration, verbal cues required, tactile cues required, and needs further education  HOME EXERCISE PROGRAM: Access Code: [facility] URL: [email or web address] Date: 02/23/2024 Prepared by: Lamar Ivory  Exercises - Supine Figure 4 Piriformis Stretch  - 1 x daily - 7 x weekly - 1 sets - 5 reps - 20 seconds hold - Clamshell with Resistance  - 1 x daily - 3 x weekly - 2 sets - 10 reps - 3 seconds hold - Seated Straight Leg Raise   - 1 x daily - 3 x weekly - 3-5 sets - 10 reps - Yoga Bridge  - 1 x daily - 3 x weekly - 2 sets - 10 reps - 5 seconds hold - Standing Lumbar Extension at Wall - Forearms  - 2-3 x daily - 7 x weekly - 1 sets - 5 reps - 3 seconds hold - Standing Scapular Retraction  - 2-3 x daily - 7 x weekly - 1 sets - 5 reps - 5 second hold - Tandem Stance  - 1 x daily - 3-7 x weekly - 1 sets - 10 reps - 20 second  hold - Single Leg Stance  - 1 x daily -  3-7 x weekly - 1 sets - 10 reps - 10 seconds hold - Standing Hip Hiking  - 2 x daily - 7 x weekly - 2 sets - 10 reps - 3 seconds hold  ASSESSMENT:  CLINICAL IMPRESSION: Janaysia notes her low back, left knee and right hip pain are improved since starting supervised physical therapy.  Left quadriceps, bilateral hip abductors and lumbar extensors strength will continue to benefit from attention with her home and clinic program and Mahum is getting more comfortable with the idea of transition into more independent rehabilitation.  OBJECTIVE IMPAIRMENTS: cardiopulmonary status limiting activity, decreased activity tolerance, decreased endurance, decreased knowledge of condition, difficulty walking, decreased ROM, decreased strength, increased edema, impaired perceived functional ability, and pain.   ACTIVITY LIMITATIONS: carrying, lifting, bending, squatting, sleeping, stairs, and locomotion level  PARTICIPATION LIMITATIONS: interpersonal relationship, driving, community activity, and church  PERSONAL FACTORS: Knee OA, pre-diabetes, HTN are also affecting patient's functional outcome.   REHAB POTENTIAL: Good  CLINICAL DECISION MAKING: Evolving/moderate complexity  EVALUATION COMPLEXITY: Moderate   GOALS: Goals reviewed with patient? Yes  SHORT TERM GOALS: Target date: 02/23/2024 Brittini will be independent with her day 1 home exercise program Baseline: Started 01/26/2024 Goal status: Met 02/07/2024  2.  Improve right hip external rotation active range of motion to > 40 degrees Baseline: 34 degrees Goal status: Met 02/16/2024  3.  Improve bilateral quadriceps strength to at least 65 pounds Baseline: 56.4/55.6 Goal status: Ongoing 02/16/2024   LONG TERM GOALS: Target date: 03/22/2024  Improve patient-specific functional score to at least 7.5 Baseline: 5.5 Goal status: Met 03/01/2024  2.  Harlean will report left knee and right hip pain  consistently 0-3/10 on the visual analog scale Baseline: Left knee currently as high as 5/10 Goal status: Met 02/16/2024  3.  Improve bilateral quadriceps strength to at least 70 pounds Baseline: 56.4/55.6 pounds Goal status: INITIAL  4.  Improve bilateral hip abductor strength to at least 5 -/5 MMT Baseline: 4+/4 MMT Goal status: Ongoing 02/16/2024  5.  Anie will be independent with her long-term maintenance home exercise program at discharge Baseline: Started 01/26/2024 Goal status: Ongoing 03/01/2024  PLAN:  PT FREQUENCY: 1-2x/week  PT DURATION: 8 weeks  PLANNED INTERVENTIONS: 97110-Therapeutic exercises, 97530- Therapeutic activity, 97112- Neuromuscular re-education, 97535- Self Care, 02859- Manual therapy, [id number]- Gait training, [phone]- Vasopneumatic device, [phone] (1-2 muscles), [id number] (3+ muscles)- Dry Needling, Patient/Family education, Joint mobilization, and Cryotherapy  PLAN FOR NEXT SESSION: Emphasis on quadriceps, low back and hip abductor strengthening while avoiding overuse.  Appropriate progressions while avoiding overuse with weight-bearing.  How is her consistency with spine mechanics and exercises at home?   Myer LELON Ivory, PT, MPT 03/01/2024  5:16 PM   "

## 2024-03-08 ENCOUNTER — Encounter: Admitting: Rehabilitative and Restorative Service Providers"

## 2024-03-12 ENCOUNTER — Ambulatory Visit: Admitting: Internal Medicine

## 2024-03-12 ENCOUNTER — Encounter: Payer: Self-pay | Admitting: Internal Medicine

## 2024-03-12 VITALS — BP 102/84 | HR 62 | Temp 97.3°F | Resp 16 | Ht 65.0 in | Wt 144.0 lb

## 2024-03-12 DIAGNOSIS — K2289 Other specified disease of esophagus: Secondary | ICD-10-CM

## 2024-03-12 DIAGNOSIS — Z9884 Bariatric surgery status: Secondary | ICD-10-CM

## 2024-03-12 DIAGNOSIS — K317 Polyp of stomach and duodenum: Secondary | ICD-10-CM | POA: Diagnosis not present

## 2024-03-12 DIAGNOSIS — K219 Gastro-esophageal reflux disease without esophagitis: Secondary | ICD-10-CM

## 2024-03-12 DIAGNOSIS — D509 Iron deficiency anemia, unspecified: Secondary | ICD-10-CM | POA: Diagnosis not present

## 2024-03-12 MED ORDER — SODIUM CHLORIDE 0.9 % IV SOLN: 500.0000 mL | Freq: Once | INTRAVENOUS | Status: DC

## 2024-03-12 NOTE — Progress Notes (Signed)
 "   GASTROENTEROLOGY PROCEDURE H&P NOTE   Primary Care Physician: Geofm Glade PARAS, MD    Reason for Procedure:  Iron deficiency anemia  Plan:    Upper endoscopy  Patient is appropriate for endoscopic procedure(s) in the ambulatory ([facility]) setting.  The nature of the procedure, as well as the risks, benefits, and alternatives were carefully and thoroughly reviewed with the patient. Ample time for discussion and questions allowed.  All questions were answered. The patient understood, was satisfied, and agreed with the plan to proceed.    HPI: Victoria Stone is a 73 y.o. female who presents for EGD.  Medical history as below.   No recent chest pain or shortness of breath.  No abdominal pain today.  Frequent blood donor.  Last colonoscopy May 2023, 2 subcentimeter polyps removed found to be sessile serrated polyps.  Diverticulosis and internal hemorrhoids.  Patient using meloxicam  at time of appointment in November but has stopped.  Past Medical History:  Diagnosis Date   ALLERGIC RHINITIS    ARTHRITIS    Arthritis of knee 11/20/2008   Carpal tunnel syndrome on right 04/04/2017   Chest pain 07/15/2015   Dyslipidemia    Family history of premature coronary artery disease 07/15/2015   GERD (gastroesophageal reflux disease) 02/08/2016   Obese    Prediabetes 04/06/2017   ULCERATIVE COLITIS    Ulcerative colitis (HCC) 11/20/2008    Past Surgical History:  Procedure Laterality Date   ABDOMINAL HYSTERECTOMY  03/15/1999   BONE SPUR     BREAST SURGERY  03/14/1986   Reduction   CARPAL TUNNEL RELEASE Right 12/31/2018   Procedure: RIGHT CARPAL TUNNEL RELEASE;  Surgeon: Murrell Drivers, MD;  Location:  SUR[facility]ERY [facility];  Service: Orthopedics;  Laterality: Right;   COLONOSCOPY  02/14/2011   Dr.Patterson   FOOT SURGERY  90, 04, 09   X'S 3   HAMMER TOE SURGERY     LAPAROSCOPIC GASTRIC BANDING  03/15/2003   TUBAL LIGATION  03/14/1986    Prior to Admission medications  Medication Sig Start  Date End Date Taking? Authorizing Provider  atorvastatin  (LIPITOR) 20 MG tablet TAKE 1 TABLET BY MOUTH EVERY DAY 08/14/23  Yes Burns, Glade PARAS, MD  Calcium  Carb-Cholecalciferol 600-3.125 MG-MCG TABS 1 tablet every day by oral route. 03/14/1993  Yes [provider]  Calcium  Carbonate (CALCIUM  600 PO) Take by mouth daily.   Yes [provider]  famotidine  (PEPCID ) 40 MG tablet TAKE 1 TABLET BY MOUTH EVERY DAY 11/09/23  Yes Burns, Glade PARAS, MD  Ferrous Sulfate Dried (SLOW RELEASE IRON) 45 MG TBCR  01/13/24  Yes [provider]  FLUoxetine  (PROZAC ) 20 MG capsule Take 1 capsule (20 mg total) by mouth daily. 06/01/23  Yes Burns, Glade PARAS, MD  Multiple Vitamins-Minerals (MULTIVITAMIN) tablet Take 1 tablet by mouth daily. 02/08/16  Yes Burns, Glade PARAS, MD  pantoprazole  (PROTONIX ) 40 MG tablet Take 1 tablet (40 mg total) by mouth 2 (two) times daily. 01/22/24  Yes Burns, Glade PARAS, MD  telmisartan  (MICARDIS ) 20 MG tablet TAKE 1 TABLET BY MOUTH EVERY DAY 11/06/23  Yes Burns, Glade PARAS, MD  VITAMIN D , ERGOCALCIFEROL , PO Take 1,000 Units by mouth.   Yes [provider]  zaleplon  (SONATA ) 5 MG capsule Take 1 capsule (5 mg total) by mouth at bedtime as needed for sleep. 01/15/24  Yes Burns, Glade PARAS, MD  meloxicam  (MOBIC ) 7.5 MG tablet TAKE 1 TABLET (7.5 MG TOTAL) BY MOUTH EVERY OTHER DAY. Patient taking differently: Take  7.5 mg by mouth daily. 01/08/24   Geofm Glade PARAS, MD  meloxicam  (MOBIC ) 7.5 MG tablet Take 7.5 mg by mouth daily. Patient not taking: No sig reported    [provider]  triamcinolone  ointment (KENALOG ) 0.5 % Apply 1 Application topically 2 (two) times daily as needed (foot rash). 10/31/22   Geofm Glade PARAS, MD    Current Outpatient Medications  Medication Sig Dispense Refill   atorvastatin  (LIPITOR) 20 MG tablet TAKE 1 TABLET BY MOUTH EVERY DAY 90 tablet 3   Calcium  Carb-Cholecalciferol 600-3.125 MG-MCG TABS 1 tablet every day by oral route.     Calcium  Carbonate  (CALCIUM  600 PO) Take by mouth daily.     famotidine  (PEPCID ) 40 MG tablet TAKE 1 TABLET BY MOUTH EVERY DAY 90 tablet 0   Ferrous Sulfate Dried (SLOW RELEASE IRON) 45 MG TBCR      FLUoxetine  (PROZAC ) 20 MG capsule Take 1 capsule (20 mg total) by mouth daily. 90 capsule 3   Multiple Vitamins-Minerals (MULTIVITAMIN) tablet Take 1 tablet by mouth daily.     pantoprazole  (PROTONIX ) 40 MG tablet Take 1 tablet (40 mg total) by mouth 2 (two) times daily. 60 tablet 3   telmisartan  (MICARDIS ) 20 MG tablet TAKE 1 TABLET BY MOUTH EVERY DAY 90 tablet 1   VITAMIN D , ERGOCALCIFEROL , PO Take 1,000 Units by mouth.     zaleplon  (SONATA ) 5 MG capsule Take 1 capsule (5 mg total) by mouth at bedtime as needed for sleep. 30 capsule 5   meloxicam  (MOBIC ) 7.5 MG tablet TAKE 1 TABLET (7.5 MG TOTAL) BY MOUTH EVERY OTHER DAY. (Patient taking differently: Take 7.5 mg by mouth daily.) 45 tablet 0   meloxicam  (MOBIC ) 7.5 MG tablet Take 7.5 mg by mouth daily. (Patient not taking: No sig reported)     triamcinolone  ointment (KENALOG ) 0.5 % Apply 1 Application topically 2 (two) times daily as needed (foot rash). 30 g 1   Current Facility-Administered Medications  Medication Dose Route Frequency Provider Last Rate Last Admin   0.9 %  sodium chloride  infusion  500 mL Intravenous Once [place] Leverich, Gordy HERO, MD        Allergies as of 03/12/2024 - Review Complete 03/12/2024  Allergen Reaction Noted   Penicillins Hives and Rash    Trazodone  and nefazodone Other (See Comments) 11/07/2019    Family History  Problem Relation Age of Onset   Hypertension Mother    Lymphoma Mother    Other Mother        PACEMAKER   Heart disease Mother    Heart attack Mother    Hypertension Father    Heart disease Father    Heart attack Father    Hypertension Sister    Diabetes Sister    Heart attack Sister    Heart disease Sister    Breast cancer Sister    Heart disease Brother    Breast cancer Brother    Hypertension Maternal Grandmother     Hypertension Paternal Grandmother    Hypertension Paternal Grandfather    Colon cancer Neg Hx    Stomach cancer Neg Hx    Rectal cancer Neg Hx    Esophageal cancer Neg Hx     Social History   Socioeconomic History   Marital status: Married    Spouse name: Not on file   Number of children: 2   Years of education: Not on file   Highest education level: Bachelor's degree (e.g., BA, AB, BS)  Occupational History  Occupation: Teacher, Adult Education: [employer]  Tobacco Use   Smoking status: Never   Smokeless tobacco: Never   Tobacco comments:    Married, 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[employer]  Vaping Use   Vaping status: Never Used  Substance and Sexual Activity   Alcohol use: Yes    Alcohol/week: 1.0 standard drink of alcohol    Types: 1 Glasses of wine per week    Comment: occasionally   Drug use: No   Sexual activity: Yes  Other Topics Concern   Not on file  Social History Narrative   RN at [employer]   Married, lives with spouse   Social Drivers of Health   Tobacco Use: Low Risk (03/12/2024)   Patient History    Smoking Tobacco Use: Never    Smokeless Tobacco Use: Never    Passive Exposure: Not on file  Financial Resource Strain: Low Risk (01/14/2024)   Overall Financial Resource Strain (CARDIA)    Difficulty of Paying Living Expenses: Not hard at all  Food Insecurity: No Food Insecurity (01/14/2024)   Epic    Worried About Radiation Protection Practitioner of Food in the Last Year: Never true    Ran Out of Food in the Last Year: Never true  Transportation Needs: No Transportation Needs (01/14/2024)   Epic    Lack of Transportation (Medical): No    Lack of Transportation (Non-Medical): No  Physical Activity: Sufficiently Active (01/14/2024)   Exercise Vital Sign    Days of Exercise per Week: 6 days    Minutes of Exercise per Session: 30 min  Stress: Stress Concern Present (01/14/2024)   [employer] of Occupational Health - Occupational Stress Questionnaire    Feeling of  Stress: Rather much  Social Connections: Socially Integrated (01/14/2024)   Social Connection and Isolation Panel    Frequency of Communication with Friends and Family: More than three times a week    Frequency of Social Gatherings with Friends and Family: More than three times a week    Attends Religious Services: More than 4 times per year    Active Member of Clubs or Organizations: Yes    Attends Banker Meetings: More than 4 times per year    Marital Status: Married  Catering Manager Violence: Not At Risk (11/10/2023)   Epic    Fear of Current or Ex-Partner: No    Emotionally Abused: No    Physically Abused: No    Sexually Abused: No  Depression (PHQ2-9): Low Risk (01/15/2024)   Depression (PHQ2-9)    PHQ-2 Score: 2  Alcohol Screen: Low Risk (01/14/2024)   Alcohol Screen    Last Alcohol Screening Score (AUDIT): 1  Housing: Low Risk (01/14/2024)   Epic    Unable to Pay for Housing in the Last Year: No    Number of Times Moved in the Last Year: 0    Homeless in the Last Year: No  Utilities: Not At Risk (11/10/2023)   Epic    Threatened with loss of utilities: No  Health Literacy: Adequate Health Literacy (11/10/2023)   B1300 Health Literacy    Frequency of need for help with medical instructions: Never    Physical Exam: Vital signs in last 24 hours: @BP  137/78   Pulse 68   Temp (!) 97.3 F (36.3 C) (Skin)   Ht 5' 5 (1.651 m)   Wt 144 lb (65.3 kg)   SpO2 98%   BMI 23.96 kg/m  GEN: NAD EYE: Sclerae anicteric  ENT: MMM CV: Non-tachycardic Pulm: CTA b/l GI: Soft, NT/ND NEURO:  Alert & Oriented x 3   Gordy Starch, MD [facility]  03/12/2024 10:10 AM  "

## 2024-03-12 NOTE — Patient Instructions (Signed)
 Discharge instructions given. Biopsies taken. See Recommendations on procedure report. Resume previous medications. YOU HAD AN ENDOSCOPIC PROCEDURE TODAY AT THE [facility]:   Refer to the procedure report that was given to you for any specific questions about what was found during the examination.  If the procedure report does not answer your questions, please call your gastroenterologist to clarify.  If you requested that your care partner not be given the details of your procedure findings, then the procedure report has been included in a sealed envelope for you to review at your convenience later.  YOU SHOULD EXPECT: Some feelings of bloating in the abdomen. Passage of more gas than usual.  Walking can help get rid of the air that was put into your GI tract during the procedure and reduce the bloating. If you had a lower endoscopy (such as a colonoscopy or flexible sigmoidoscopy) you may notice spotting of blood in your stool or on the toilet paper. If you underwent a bowel prep for your procedure, you may not have a normal bowel movement for a few days.  Please Note:  You might notice some irritation and congestion in your nose or some drainage.  This is from the oxygen used during your procedure.  There is no need for concern and it should clear up in a day or so.  SYMPTOMS TO REPORT IMMEDIATELY:   Following upper endoscopy (EGD)  Vomiting of blood or coffee ground material  New chest pain or pain under the shoulder blades  Painful or persistently difficult swallowing  New shortness of breath  Fever of 100F or higher  Black, tarry-looking stools  For urgent or emergent issues, a gastroenterologist can be reached at any hour by calling [phone]. Do not use MyChart messaging for urgent concerns.    DIET:  We do recommend a small meal at first, but then you may proceed to your regular diet.  Drink plenty of fluids but you should avoid alcoholic beverages for 24  hours.  ACTIVITY:  You should plan to take it easy for the rest of today and you should NOT DRIVE or use heavy machinery until tomorrow (because of the sedation medicines used during the test).    FOLLOW UP: Our staff will call the number listed on your records the next business day following your procedure.  We will call around 7:15- 8:00 am to check on you and address any questions or concerns that you may have regarding the information given to you following your procedure. If we do not reach you, we will leave a message.     If any biopsies were taken you will be contacted by phone or by letter within the next 1-3 weeks.  Please call us at [phone] if you have not heard about the biopsies in 3 weeks.    SIGNATURES/CONFIDENTIALITY: You and/or your care partner have signed paperwork which will be entered into your electronic medical record.  These signatures attest to the fact that that the information above on your After Visit Summary has been reviewed and is understood.  Full responsibility of the confidentiality of this discharge information lies with you and/or your care-partner.

## 2024-03-12 NOTE — Op Note (Signed)
 [facility] Patient Name: Victoria Stone Procedure Date: 03/12/2024 9:58 AM MRN: [id number] Endoscopist: Gordy CHRISTELLA Starch , MD, [id number] Age: 73 Referring MD:  Date of Birth: March 12, 1951 Gender: Female Account #: [id number] Procedure:                Upper GI endoscopy Indications:              Iron deficiency anemia, GERD, pt reports persist                            symptoms despite BID PPI, PPI was increased to BID                            in Nov and meloxicam  was stopped when IDA was                            discovered Medicines:                Monitored Anesthesia Care Procedure:                Pre-Anesthesia Assessment:                           - Prior to the procedure, a History and Physical                            was performed, and patient medications and                            allergies were reviewed. The patient's tolerance of                            previous anesthesia was also reviewed. The risks                            and benefits of the procedure and the sedation                            options and risks were discussed with the patient.                            All questions were answered, and informed consent                            was obtained. Prior Anticoagulants: The patient has                            taken no anticoagulant or antiplatelet agents. ASA                            Grade Assessment: II - A patient with mild systemic                            disease. After reviewing the risks and benefits,  the patient was deemed in satisfactory condition to                            undergo the procedure.                           After obtaining informed consent, the endoscope was                            passed under direct vision. Throughout the                            procedure, the patient's blood pressure, pulse, and                            oxygen saturations were monitored continuously.  The                            Olympus scope [id number] was introduced through the                            mouth, and advanced to the second part of duodenum.                            The upper GI endoscopy was accomplished without                            difficulty. The patient tolerated the procedure                            well. Scope In: Scope Out: Findings:                 The lumen of the middle third of the esophagus and                            lower third of the esophagus was mildly dilated.                           Fluid was found in the lower third of the                            esophagus. Fluid was removed with scope suction.                           Evidence of an adjustable gastric banding was found                            in the cardia. This was characterized by an intact                            appearance.                           A few small sessile  polyps with no bleeding and no                            stigmata of recent bleeding were found in the                            gastric fundus and in the gastric body.                           The exam of the stomach was otherwise normal.                           The examined duodenum was normal. Biopsies for                            histology were taken with a cold forceps for                            evaluation of celiac disease. Complications:            No immediate complications. Estimated Blood Loss:     Estimated blood loss: none. Impression:               - Dilation in the middle third of the esophagus and                            in the lower third of the esophagus. Related to                            gastric lap-band.                           - Fluid in the lower third of the esophagus                            consistent with esophageal stasis related to                            gastric lap-band. Fluid aspiration performed.                           - An adjustable gastric  banding was found,                            characterized by an intact appearance.                           - A few gastric polyps, benign and most consistent                            with fundic gland polyps.                           - Normal examined duodenum. Biopsied. Recommendation:           - Patient  has a contact number available for                            emergencies. The signs and symptoms of potential                            delayed complications were discussed with the                            patient. Return to normal activities tomorrow.                            Written discharge instructions were provided to the                            patient.                           - Resume previous diet.                           - Continue present medications.                           - GERD symptoms and esophageal stasis related to                            gastric lap-band.                           - Bariatric surgical consult recommended to reduce                            volume and gastric lap band to allow for improved                            esophageal clearance and likely reflux symptoms.                           - Await pathology results.                           - Video capsule endoscopy to be considered to                            complete IDA evaluation however Lap-Band would need                            to be deflated before video capsule endoscope could                            be swallowed. Otherwise would need endoscopic                            placement.                           -  Continue iron replacement with follow-up iron                            stores and CBC. Gordy CHRISTELLA Starch, MD 03/12/2024 10:41:58 AM This report has been signed electronically.

## 2024-03-12 NOTE — Progress Notes (Signed)
 Called to room to assist during endoscopic procedure.  Patient ID and intended procedure confirmed with present staff. Received instructions for my participation in the procedure from the performing physician.

## 2024-03-12 NOTE — Progress Notes (Signed)
 Sedate, gd SR, tolerated procedure well, VSS, report to RN

## 2024-03-15 ENCOUNTER — Encounter: Admitting: Rehabilitative and Restorative Service Providers"

## 2024-03-15 ENCOUNTER — Telehealth: Payer: Self-pay

## 2024-03-15 LAB — SURGICAL PATHOLOGY

## 2024-03-15 NOTE — Telephone Encounter (Signed)
 Follow up call to pt, lm for pt to call if having any difficulty with normal activities or eating and drinking.  Also to call if any other questions or concerns.

## 2024-03-18 ENCOUNTER — Telehealth: Payer: Self-pay

## 2024-03-18 ENCOUNTER — Ambulatory Visit: Payer: Self-pay | Admitting: Internal Medicine

## 2024-03-18 NOTE — Telephone Encounter (Signed)
 Referral faxed to [facility] to get her scheduled. Reminder lab sent to myself to contact patient in one month to have her come in for repeat labs (CBC, IBC/ferritin). Also, March schedule is not available at this time. Will put patient on wait list to contact when it becomes available.

## 2024-03-18 NOTE — Telephone Encounter (Signed)
-----   Message from Gordy Starch, MD sent at 03/12/2024 10:55 AM EST ----- [facility] bariatric referral to reduce lap band (not placed there, placed in [place] 20 years ago CBC, IBC and ferritin in 1 month OV with me in March JMP

## 2024-03-19 NOTE — Telephone Encounter (Signed)
 March schedule is available. Patient scheduled to see Dr. Albertus on 05/27/24 at 1:30pm. Patient is aware of appt. Informed patient I will call to remind her to come for her repeat labs in one month. Patient verbalized understanding.

## 2024-04-05 ENCOUNTER — Other Ambulatory Visit: Payer: Self-pay | Admitting: Student

## 2024-04-05 DIAGNOSIS — K219 Gastro-esophageal reflux disease without esophagitis: Secondary | ICD-10-CM

## 2024-04-16 ENCOUNTER — Telehealth: Payer: Self-pay

## 2024-04-16 ENCOUNTER — Other Ambulatory Visit: Payer: Self-pay

## 2024-04-16 ENCOUNTER — Encounter: Payer: Self-pay | Admitting: Cardiology

## 2024-04-16 ENCOUNTER — Ambulatory Visit: Admitting: Cardiology

## 2024-04-16 VITALS — BP 100/70 | HR 69 | Ht 65.0 in | Wt 140.0 lb

## 2024-04-16 DIAGNOSIS — E7800 Pure hypercholesterolemia, unspecified: Secondary | ICD-10-CM | POA: Diagnosis not present

## 2024-04-16 DIAGNOSIS — I2510 Atherosclerotic heart disease of native coronary artery without angina pectoris: Secondary | ICD-10-CM | POA: Diagnosis not present

## 2024-04-16 DIAGNOSIS — D509 Iron deficiency anemia, unspecified: Secondary | ICD-10-CM

## 2024-04-16 NOTE — Telephone Encounter (Signed)
-----   Message from [facility] Alan M sent at 03/19/2024  1:35 PM EST ----- Call patient and remind her to come in for repeat labs (CBC, IBC/ferritin)

## 2024-04-16 NOTE — Telephone Encounter (Signed)
 Called patient to remind her to come in for repeat lab work. Patient verbalized understanding and will come in this week.

## 2024-04-16 NOTE — Patient Instructions (Signed)

## 2024-04-17 ENCOUNTER — Other Ambulatory Visit

## 2024-04-17 DIAGNOSIS — D509 Iron deficiency anemia, unspecified: Secondary | ICD-10-CM

## 2024-04-17 LAB — CBC WITH DIFFERENTIAL/PLATELET
Basophils Absolute: 0.1 10*3/uL (ref 0.0–0.1)
Basophils Relative: 1.7 % (ref 0.0–3.0)
Eosinophils Absolute: 0.2 10*3/uL (ref 0.0–0.7)
Eosinophils Relative: 3.8 % (ref 0.0–5.0)
HCT: 38.5 % (ref 36.0–46.0)
Hemoglobin: 12.8 g/dL (ref 12.0–15.0)
Lymphocytes Relative: 33.3 % (ref 12.0–46.0)
Lymphs Abs: 1.8 10*3/uL (ref 0.7–4.0)
MCHC: 33.4 g/dL (ref 30.0–36.0)
MCV: 80.5 fl (ref 78.0–100.0)
Monocytes Absolute: 0.4 10*3/uL (ref 0.1–1.0)
Monocytes Relative: 7.2 % (ref 3.0–12.0)
Neutro Abs: 2.9 10*3/uL (ref 1.4–7.7)
Neutrophils Relative %: 54 % (ref 43.0–77.0)
Platelets: 258 10*3/uL (ref 150.0–400.0)
RBC: 4.78 Mil/uL (ref 3.87–5.11)
RDW: 20.1 % — ABNORMAL HIGH (ref 11.5–15.5)
WBC: 5.3 10*3/uL (ref 4.0–10.5)

## 2024-04-17 LAB — IBC + FERRITIN
Ferritin: 23.5 ng/mL (ref 10.0–291.0)
Iron: 77 ug/dL (ref 42–145)
Saturation Ratios: 21.4 % (ref 20.0–50.0)
TIBC: 359.8 ug/dL (ref 250.0–450.0)
Transferrin: 257 mg/dL (ref 212.0–360.0)

## 2024-04-18 ENCOUNTER — Other Ambulatory Visit: Payer: Self-pay | Admitting: Internal Medicine

## 2024-04-18 ENCOUNTER — Other Ambulatory Visit

## 2024-05-16 ENCOUNTER — Other Ambulatory Visit

## 2024-05-27 ENCOUNTER — Ambulatory Visit: Admitting: Internal Medicine

## 2024-07-15 ENCOUNTER — Ambulatory Visit: Admitting: Internal Medicine

## 2024-11-13 ENCOUNTER — Ambulatory Visit
